# Patient Record
Sex: Female | Born: 1985 | Race: White | Hispanic: No | Marital: Single | Smoking: Former smoker
Health system: Southern US, Community
[De-identification: ages and names within clinical notes are randomized; demographics above are authoritative.]

## PROBLEM LIST (undated history)

## (undated) DIAGNOSIS — R51 Headache: Secondary | ICD-10-CM

## (undated) DIAGNOSIS — F329 Major depressive disorder, single episode, unspecified: Secondary | ICD-10-CM

## (undated) DIAGNOSIS — F32A Depression, unspecified: Secondary | ICD-10-CM

## (undated) DIAGNOSIS — N809 Endometriosis, unspecified: Secondary | ICD-10-CM

## (undated) DIAGNOSIS — Z8669 Personal history of other diseases of the nervous system and sense organs: Secondary | ICD-10-CM

## (undated) DIAGNOSIS — C801 Malignant (primary) neoplasm, unspecified: Secondary | ICD-10-CM

## (undated) DIAGNOSIS — R32 Unspecified urinary incontinence: Secondary | ICD-10-CM

## (undated) DIAGNOSIS — N200 Calculus of kidney: Secondary | ICD-10-CM

## (undated) DIAGNOSIS — B019 Varicella without complication: Secondary | ICD-10-CM

## (undated) DIAGNOSIS — N3010 Interstitial cystitis (chronic) without hematuria: Secondary | ICD-10-CM

## (undated) DIAGNOSIS — R519 Headache, unspecified: Secondary | ICD-10-CM

## (undated) HISTORY — DX: Depression, unspecified: F32.A

## (undated) HISTORY — DX: Varicella without complication: B01.9

## (undated) HISTORY — DX: Headache, unspecified: R51.9

## (undated) HISTORY — DX: Personal history of other diseases of the nervous system and sense organs: Z86.69

## (undated) HISTORY — DX: Interstitial cystitis (chronic) without hematuria: N30.10

## (undated) HISTORY — DX: Malignant (primary) neoplasm, unspecified: C80.1

## (undated) HISTORY — DX: Major depressive disorder, single episode, unspecified: F32.9

## (undated) HISTORY — DX: Unspecified urinary incontinence: R32

## (undated) HISTORY — DX: Endometriosis, unspecified: N80.9

## (undated) HISTORY — DX: Headache: R51

## (undated) HISTORY — DX: Calculus of kidney: N20.0

---
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
## 2009-09-21 ENCOUNTER — Emergency Department: Payer: Self-pay | Admitting: Emergency Medicine

## 2009-10-11 ENCOUNTER — Encounter: Payer: Self-pay | Admitting: Orthopedic Surgery

## 2011-06-05 HISTORY — PX: TONSILLECTOMY AND ADENOIDECTOMY: SHX28

## 2011-12-21 ENCOUNTER — Ambulatory Visit: Payer: Self-pay | Admitting: Unknown Physician Specialty

## 2012-07-15 ENCOUNTER — Emergency Department: Payer: Self-pay | Admitting: Emergency Medicine

## 2012-07-15 LAB — CBC WITH DIFFERENTIAL/PLATELET
Basophil #: 0 10*3/uL (ref 0.0–0.1)
Eosinophil #: 0.1 10*3/uL (ref 0.0–0.7)
Eosinophil %: 1.7 %
HGB: 12.8 g/dL (ref 12.0–16.0)
Lymphocyte #: 1 10*3/uL (ref 1.0–3.6)
Monocyte %: 6.9 %
Neutrophil #: 3.2 10*3/uL (ref 1.4–6.5)
RBC: 4.2 10*6/uL (ref 3.80–5.20)
WBC: 4.7 10*3/uL (ref 3.6–11.0)

## 2012-07-15 LAB — BASIC METABOLIC PANEL
Anion Gap: 9 (ref 7–16)
BUN: 11 mg/dL (ref 7–18)
Co2: 22 mmol/L (ref 21–32)
Creatinine: 0.9 mg/dL (ref 0.60–1.30)
EGFR (African American): >60

## 2013-05-26 ENCOUNTER — Ambulatory Visit: Payer: Self-pay | Admitting: Emergency Medicine

## 2013-09-07 LAB — HM PAP SMEAR: HM Pap smear: NORMAL

## 2015-02-22 ENCOUNTER — Encounter: Payer: Self-pay | Admitting: Nurse Practitioner

## 2015-02-22 ENCOUNTER — Ambulatory Visit (INDEPENDENT_AMBULATORY_CARE_PROVIDER_SITE_OTHER): Payer: Self-pay | Admitting: Nurse Practitioner

## 2015-02-22 ENCOUNTER — Encounter (INDEPENDENT_AMBULATORY_CARE_PROVIDER_SITE_OTHER): Payer: Self-pay

## 2015-02-22 VITALS — BP 108/72 | HR 84 | Temp 98.2°F | Resp 14 | Ht 65.0 in | Wt 235.6 lb

## 2015-02-22 DIAGNOSIS — R519 Headache, unspecified: Secondary | ICD-10-CM

## 2015-02-22 DIAGNOSIS — Z23 Encounter for immunization: Secondary | ICD-10-CM

## 2015-02-22 DIAGNOSIS — Z7189 Other specified counseling: Secondary | ICD-10-CM

## 2015-02-22 DIAGNOSIS — R51 Headache: Secondary | ICD-10-CM

## 2015-02-22 DIAGNOSIS — F411 Generalized anxiety disorder: Secondary | ICD-10-CM

## 2015-02-22 DIAGNOSIS — E669 Obesity, unspecified: Secondary | ICD-10-CM

## 2015-02-22 DIAGNOSIS — Z7689 Persons encountering health services in other specified circumstances: Secondary | ICD-10-CM

## 2015-02-22 MED ORDER — BUSPIRONE HCL 7.5 MG PO TABS: 7.5000 mg | ORAL_TABLET | Freq: Three times a day (TID) | ORAL | Status: DC

## 2015-02-22 NOTE — Patient Instructions (Signed)
Welcome to [facility]! Nice to meet you.   Follow up in 1 month. Need 7 days worth of exercise, food, and fluids that we can look over at next visit.

## 2015-02-22 NOTE — Progress Notes (Signed)
Pre visit review using our clinic review tool, if applicable. No additional management support is needed unless otherwise documented below in the visit note. 

## 2015-02-22 NOTE — Progress Notes (Signed)
Patient ID: Laura Wiggins, female    DOB: [birth year]  Age: 29 y.o. MRN: [id number]  CC: Establish Care   HPI Laura Wiggins presents for establishing care and CC of headache, weight loss, and anxiety.   1) New pt info:   Immunizations- unknown tdap  Pap- 2015, last one normal, past abnormal  Eye Exam- 03/28/15 scheduled  2) Acute Problems-  HA- right side of head parietal, achy, 4 x a week, been happening for at least 3 months. Family stressors, scheduled eye pain. Excedrin migraine at beginning of headache. Denies auras.   Weight- tried phentermine in past with success   Anxiety- Has tried multiple medications in the past. Pt willing to try anything to help with stress.    History Laura Wiggins has a past medical history of Cancer; Chicken pox; Depression; Frequent headaches; Kidney stones; migraines; Urinary incontinence; Endometriosis (2004); and Interstitial cystitis (2004).   She has past surgical history that includes Tonsillectomy and adenoidectomy (2013).   Her family history includes Diabetes in her mother; Hyperlipidemia in her mother; Hypertension in her mother.She reports that she has quit smoking. She has never used smokeless tobacco. She reports that she drinks alcohol. She reports that she does not use illicit drugs.  No outpatient prescriptions prior to visit.   No facility-administered medications prior to visit.    ROS Review of Systems  Constitutional: Negative for fever, chills, diaphoresis and fatigue.  Respiratory: Negative for chest tightness, shortness of breath and wheezing.   Cardiovascular: Negative for chest pain, palpitations and leg swelling.  Gastrointestinal: Negative for nausea, vomiting and diarrhea.  Skin: Negative for rash.  Neurological: Positive for headaches. Negative for dizziness, weakness and numbness.  Psychiatric/Behavioral: The patient is nervous/anxious.     Objective:  BP 108/72 mmHg  Pulse 84  Temp(Src) 98.2 F (36.8 C)  Resp  14  Ht 5\' 5"  (1.651 m)  Wt 235 lb 9.6 oz (106.867 kg)  BMI 39.21 kg/m2  SpO2 96%  Physical Exam  Constitutional: She is oriented to person, place, and time. She appears well-developed and well-nourished. No distress.  HENT:  Head: Normocephalic and atraumatic.  Right Ear: External ear normal.  Left Ear: External ear normal.  Cardiovascular: Normal rate, regular rhythm and normal heart sounds.   Pulmonary/Chest: Effort normal and breath sounds normal. No respiratory distress. She has no wheezes. She has no rales. She exhibits no tenderness.  Neurological: She is alert and oriented to person, place, and time. No cranial nerve deficit. She exhibits normal muscle tone. Coordination normal.  Skin: Skin is warm and dry. No rash noted. She is not diaphoretic.  Psychiatric: She has a normal mood and affect. Her behavior is normal. Judgment and thought content normal.   Assessment & Plan:   Laura Wiggins was seen today for establish care.  Diagnoses and all orders for this visit:  Encounter for immunization  Encounter to establish care  Nonintractable headache, unspecified chronicity pattern, unspecified headache type  Obesity  Generalized anxiety disorder  Other orders -     busPIRone (BUSPAR) 7.5 MG tablet; Take 1 tablet (7.5 mg total) by mouth 3 (three) times daily. -     Flu Vaccine QUAD 36+ mos IM   I am having [name] start on busPIRone. I am also having her maintain her multivitamin with minerals.  Meds ordered this encounter  Medications  . Multiple Vitamins-Minerals (MULTIVITAMIN WITH MINERALS) tablet    Sig: Take 1 tablet by mouth daily.  . busPIRone (BUSPAR) 7.5 MG  tablet    Sig: Take 1 tablet (7.5 mg total) by mouth 3 (three) times daily.    Dispense:  60 tablet    Refill:  0    Order Specific Question:  Supervising Provider    Answer:  Crecencio Mc [[id number]]     Follow-up: Return in about 4 weeks (around 03/22/2015) for Follow up.

## 2015-02-24 ENCOUNTER — Telehealth: Payer: Self-pay | Admitting: Nurse Practitioner

## 2015-02-24 NOTE — Telephone Encounter (Signed)
Left detailed message on VM needing to schedule appoint

## 2015-02-24 NOTE — Telephone Encounter (Signed)
Pt called about on the right side of her throat is sore and she wants to know if something can be called in? Pharmacy is [facility] on [address] [place]!

## 2015-02-25 DIAGNOSIS — R519 Headache, unspecified: Secondary | ICD-10-CM | POA: Insufficient documentation

## 2015-02-25 DIAGNOSIS — E669 Obesity, unspecified: Secondary | ICD-10-CM | POA: Insufficient documentation

## 2015-02-25 DIAGNOSIS — R51 Headache: Secondary | ICD-10-CM

## 2015-02-25 DIAGNOSIS — Z7689 Persons encountering health services in other specified circumstances: Secondary | ICD-10-CM | POA: Insufficient documentation

## 2015-02-25 DIAGNOSIS — F411 Generalized anxiety disorder: Secondary | ICD-10-CM | POA: Insufficient documentation

## 2015-02-25 NOTE — Assessment & Plan Note (Signed)
Buspirone 7.5 mg twice daily for anxiety. Pt to follow up in 1 month.

## 2015-02-25 NOTE — Assessment & Plan Note (Signed)
Discussed acute and chronic issues. Reviewed health maintenance measures, PFSHx, and immunizations. Obtain records from previous facility.   

## 2015-02-25 NOTE — Assessment & Plan Note (Signed)
Pt encouraged to work on diet and exercise. Dr. Derrel Nip diet copy given to pt to try. Requested 7 days of food, drink, and exercise to review at next visit.

## 2015-02-25 NOTE — Assessment & Plan Note (Signed)
Stable with excedrin migraine headache. Will follow

## 2015-03-25 ENCOUNTER — Ambulatory Visit (INDEPENDENT_AMBULATORY_CARE_PROVIDER_SITE_OTHER): Payer: BLUE CROSS/BLUE SHIELD | Admitting: Nurse Practitioner

## 2015-03-25 VITALS — BP 110/78 | HR 78 | Temp 98.2°F | Resp 14 | Ht 65.0 in | Wt 232.8 lb

## 2015-03-25 DIAGNOSIS — R51 Headache: Secondary | ICD-10-CM | POA: Diagnosis not present

## 2015-03-25 DIAGNOSIS — R519 Headache, unspecified: Secondary | ICD-10-CM

## 2015-03-25 DIAGNOSIS — F411 Generalized anxiety disorder: Secondary | ICD-10-CM | POA: Diagnosis not present

## 2015-03-25 DIAGNOSIS — E669 Obesity, unspecified: Secondary | ICD-10-CM | POA: Diagnosis not present

## 2015-03-25 MED ORDER — PHENTERMINE HCL 37.5 MG PO TABS: 37.5000 mg | ORAL_TABLET | Freq: Every day | ORAL | Status: DC

## 2015-03-25 NOTE — Patient Instructions (Signed)
Follow up in 1 month to see how progress is going.   Keep up the good work!

## 2015-03-25 NOTE — Progress Notes (Signed)
Pre visit review using our clinic review tool, if applicable. No additional management support is needed unless otherwise documented below in the visit note. 

## 2015-03-25 NOTE — Progress Notes (Signed)
Patient ID: Laura Wiggins, female    DOB: [birth year]  Age: 29 y.o. MRN: [id number]  CC: Follow-up   HPI Laura Wiggins presents for follow up of weight loss concerns and anxiety.   1) Down 3 lbs from last visit Patient brought 7 days worth of food, drinks, and exercise as asked at last visit. We discussed cutting back on portions and eating a lot of high carb foods.  2) Anxiety- Doing really well on Buspar she reports   Headaches decreased from daily to 2-3 x a week   1-2 miles 3 x a week walking only   History Laura Wiggins has a past medical history of Cancer; Chicken pox; Depression; Frequent headaches; Kidney stones; migraines; Urinary incontinence; Endometriosis (2004); and Interstitial cystitis (2004).   She has past surgical history that includes Tonsillectomy and adenoidectomy (2013).   Her family history includes Diabetes in her mother; Hyperlipidemia in her mother; Hypertension in her mother.She reports that she has quit smoking. She has never used smokeless tobacco. She reports that she drinks alcohol. She reports that she does not use illicit drugs.  Outpatient Prescriptions Prior to Visit  Medication Sig Dispense Refill  . busPIRone (BUSPAR) 7.5 MG tablet Take 1 tablet (7.5 mg total) by mouth 3 (three) times daily. 60 tablet 0  . Multiple Vitamins-Minerals (MULTIVITAMIN WITH MINERALS) tablet Take 1 tablet by mouth daily.     No facility-administered medications prior to visit.    ROS Review of Systems  Constitutional: Positive for activity change. Negative for fever, chills, diaphoresis, appetite change, fatigue and unexpected weight change.       Increased  Respiratory: Negative for chest tightness, shortness of breath and wheezing.   Cardiovascular: Negative for chest pain, palpitations and leg swelling.  Gastrointestinal: Negative for nausea, vomiting and diarrhea.  Skin: Negative for rash.  Neurological: Positive for headaches. Negative for dizziness, weakness and  numbness.  Psychiatric/Behavioral: Negative for suicidal ideas and sleep disturbance. The patient is not nervous/anxious.     Objective:  BP 110/78 mmHg  Pulse 78  Temp(Src) 98.2 F (36.8 C)  Resp 14  Ht 5\' 5"  (1.651 m)  Wt 232 lb 12.8 oz (105.597 kg)  BMI 38.74 kg/m2  SpO2 95%  Physical Exam  Constitutional: She is oriented to person, place, and time. She appears well-developed and well-nourished. No distress.  HENT:  Head: Normocephalic and atraumatic.  Right Ear: External ear normal.  Left Ear: External ear normal.  Cardiovascular: Normal rate, regular rhythm and normal heart sounds.  Exam reveals no gallop and no friction rub.   No murmur heard. Pulmonary/Chest: Effort normal and breath sounds normal. No respiratory distress. She has no wheezes. She has no rales. She exhibits no tenderness.  Abdominal:  Obese  Neurological: She is alert and oriented to person, place, and time. No cranial nerve deficit. She exhibits normal muscle tone. Coordination normal.  Skin: Skin is warm and dry. No rash noted. She is not diaphoretic.  Psychiatric: She has a normal mood and affect. Her behavior is normal. Judgment and thought content normal.   Assessment & Plan:   Laura Wiggins was seen today for follow-up.  Diagnoses and all orders for this visit:  Obesity  Nonintractable headache, unspecified chronicity pattern, unspecified headache type  Generalized anxiety disorder  Other orders -     phentermine (ADIPEX-P) 37.5 MG tablet; Take 1 tablet (37.5 mg total) by mouth daily before breakfast.  I am having Laura Wiggins start on phentermine. I am also having her  maintain her multivitamin with minerals and busPIRone.  Meds ordered this encounter  Medications  . phentermine (ADIPEX-P) 37.5 MG tablet    Sig: Take 1 tablet (37.5 mg total) by mouth daily before breakfast.    Dispense:  30 tablet    Refill:  0    Order Specific Question:  Supervising Provider    Answer:  Crecencio Mc  [[id number]]     Follow-up: Return in about 4 weeks (around 04/22/2015) for Weight loss follow up .

## 2015-03-28 ENCOUNTER — Ambulatory Visit: Payer: Self-pay | Admitting: Nurse Practitioner

## 2015-04-10 ENCOUNTER — Encounter: Payer: Self-pay | Admitting: Nurse Practitioner

## 2015-04-10 NOTE — Assessment & Plan Note (Signed)
Decreased in frequency. Follow-up in one month

## 2015-04-10 NOTE — Assessment & Plan Note (Signed)
We'll continue BuSpar 3 times daily. Follow-up in one month

## 2015-04-10 NOTE — Assessment & Plan Note (Addendum)
Wt Readings from Last 3 Encounters:  03/25/15 232 lb 12.8 oz (105.597 kg)  02/22/15 235 lb 9.6 oz (106.867 kg)   Patient started at 235 lbs and down 3 lbs by decreasing portions and writing down food, drink and exercise. Pt is motivated and is interested in adding an appetite suppressant. Patient has no cardiac history, we discussed risks, benefits, and patient agreed to proceed with phentermine for 3 months. Follow-up in one month

## 2015-04-22 ENCOUNTER — Ambulatory Visit: Payer: BLUE CROSS/BLUE SHIELD | Admitting: Nurse Practitioner

## 2015-05-02 ENCOUNTER — Encounter: Payer: Self-pay | Admitting: Nurse Practitioner

## 2015-05-02 ENCOUNTER — Ambulatory Visit (INDEPENDENT_AMBULATORY_CARE_PROVIDER_SITE_OTHER): Payer: BLUE CROSS/BLUE SHIELD | Admitting: Nurse Practitioner

## 2015-05-02 VITALS — BP 102/76 | HR 71 | Temp 97.8°F | Resp 14 | Ht 65.0 in | Wt 225.8 lb

## 2015-05-02 DIAGNOSIS — F411 Generalized anxiety disorder: Secondary | ICD-10-CM | POA: Diagnosis not present

## 2015-05-02 DIAGNOSIS — E669 Obesity, unspecified: Secondary | ICD-10-CM

## 2015-05-02 DIAGNOSIS — R51 Headache: Secondary | ICD-10-CM

## 2015-05-02 DIAGNOSIS — R519 Headache, unspecified: Secondary | ICD-10-CM

## 2015-05-02 MED ORDER — SUMATRIPTAN SUCCINATE 25 MG PO TABS: 25.0000 mg | ORAL_TABLET | Freq: Once | ORAL | Status: DC

## 2015-05-02 MED ORDER — PHENTERMINE HCL 37.5 MG PO TABS: 37.5000 mg | ORAL_TABLET | Freq: Every day | ORAL | Status: DC

## 2015-05-02 MED ORDER — BUSPIRONE HCL 7.5 MG PO TABS: 7.5000 mg | ORAL_TABLET | Freq: Three times a day (TID) | ORAL | Status: DC

## 2015-05-02 NOTE — Assessment & Plan Note (Signed)
Stable. Requesting refill of Buspar 7.5 mg twice daily. Will FU in 2 months.

## 2015-05-02 NOTE — Assessment & Plan Note (Signed)
Mirgaines still happening. 1 this past week resulted in phonophobia and vomiting. Will have a trial of Imitrex 25 mg. Pt was given instructions on use. Will FU in 2 months

## 2015-05-02 NOTE — Progress Notes (Signed)
Patient ID: Laura Wiggins, female    DOB: [birth year]  Age: 29 y.o. MRN: [id number]  CC: Follow-up   HPI Laura Wiggins presents for follow up of weight loss, headaches,and anxiety.  1) Anxiety- Ran out of buspirone  2) Weight loss- Down 7 lbs since last visit.   Side effects- none reported   Took whole pill at first then went back to 1/2 tablet  Diet changes- working on low carb diet Exercise- Still going to the track and walking   3) HA- Excedrin Migraine somewhat helpful   Migraine- made her sick last week (vomited)   Always on right side of head  History Laura Wiggins has a past medical history of Cancer ([place]); Chicken pox; Depression; Frequent headaches; Kidney stones; migraines; Urinary incontinence; Endometriosis (2004); and Interstitial cystitis (2004).   She has past surgical history that includes Tonsillectomy and adenoidectomy (2013).   Her family history includes Diabetes in her mother; Hyperlipidemia in her mother; Hypertension in her mother.She reports that she has quit smoking. She has never used smokeless tobacco. She reports that she drinks alcohol. She reports that she does not use illicit drugs.  Outpatient Prescriptions Prior to Visit  Medication Sig Dispense Refill  . Multiple Vitamins-Minerals (MULTIVITAMIN WITH MINERALS) tablet Take 1 tablet by mouth daily.    . busPIRone (BUSPAR) 7.5 MG tablet Take 1 tablet (7.5 mg total) by mouth 3 (three) times daily. 60 tablet 0  . phentermine (ADIPEX-P) 37.5 MG tablet Take 1 tablet (37.5 mg total) by mouth daily before breakfast. 30 tablet 0   No facility-administered medications prior to visit.    ROS Review of Systems  Constitutional: Negative for fever, chills, diaphoresis and fatigue.  HENT: Negative for tinnitus and trouble swallowing.   Eyes: Negative for visual disturbance.  Respiratory: Negative for cough, chest tightness and wheezing.   Cardiovascular: Negative for chest pain, palpitations and leg swelling.   Gastrointestinal: Negative for nausea, vomiting and diarrhea.  Neurological: Positive for headaches.       Migraines  Psychiatric/Behavioral: Negative for suicidal ideas and sleep disturbance. The patient is nervous/anxious.     Objective:  BP 102/76 mmHg  Pulse 71  Temp(Src) 97.8 F (36.6 C)  Resp 14  Ht 5\' 5"  (1.651 m)  Wt 225 lb 12.8 oz (102.422 kg)  BMI 37.58 kg/m2  SpO2 97%  Physical Exam  Constitutional: She is oriented to person, place, and time. She appears well-developed and well-nourished. No distress.  HENT:  Head: Normocephalic and atraumatic.  Right Ear: External ear normal.  Left Ear: External ear normal.  Cardiovascular: Normal rate, regular rhythm and normal heart sounds.  Exam reveals no gallop and no friction rub.   No murmur heard. Pulmonary/Chest: Effort normal and breath sounds normal. No respiratory distress. She has no wheezes. She has no rales. She exhibits no tenderness.  Neurological: She is alert and oriented to person, place, and time. No cranial nerve deficit. She exhibits normal muscle tone. Coordination normal.  Skin: Skin is warm and dry. No rash noted. She is not diaphoretic.  Psychiatric: She has a normal mood and affect. Her behavior is normal. Judgment and thought content normal.   Assessment & Plan:   There are no diagnoses linked to this encounter. I am having [name] start on SUMAtriptan. I am also having her maintain her multivitamin with minerals, busPIRone, and phentermine.  Meds ordered this encounter  Medications  . busPIRone (BUSPAR) 7.5 MG tablet    Sig: Take 1 tablet (7.5  mg total) by mouth 3 (three) times daily.    Dispense:  60 tablet    Refill:  1    Order Specific Question:  Supervising Provider    Answer:  Deborra Medina L [[id number]]  . phentermine (ADIPEX-P) 37.5 MG tablet    Sig: Take 1 tablet (37.5 mg total) by mouth daily before breakfast.    Dispense:  30 tablet    Refill:  1    Order Specific Question:   Supervising Provider    Answer:  Deborra Medina L [[id number]]  . SUMAtriptan (IMITREX) 25 MG tablet    Sig: Take 1 tablet (25 mg total) by mouth once. May repeat in 2 hours if headache persists or recurs.    Dispense:  10 tablet    Refill:  0    Order Specific Question:  Supervising Provider    Answer:  Crecencio Mc [[id number]]     Follow-up: Return in about 2 months (around 07/02/2015) for Medication follow up.

## 2015-05-02 NOTE — Progress Notes (Signed)
Pre visit review using our clinic review tool, if applicable. No additional management support is needed unless otherwise documented below in the visit note. 

## 2015-05-02 NOTE — Assessment & Plan Note (Signed)
Wt Readings from Last 3 Encounters:  05/02/15 225 lb 12.8 oz (102.422 kg)  03/25/15 232 lb 12.8 oz (105.597 kg)  02/22/15 235 lb 9.6 oz (106.867 kg)   Pt is doing well with diet and exercise changes. She is taking 1/2 tablet intermittently of the phentermine and does well at this dosage. She will follow up in 2 months. Encouraged her to keep up the good work

## 2015-05-02 NOTE — Patient Instructions (Signed)
Great to see you! We will follow up in 2 months.   Let me know if the Imitrex is helpful or not.

## 2015-07-04 ENCOUNTER — Ambulatory Visit: Payer: BLUE CROSS/BLUE SHIELD | Admitting: Nurse Practitioner

## 2015-07-11 ENCOUNTER — Ambulatory Visit (INDEPENDENT_AMBULATORY_CARE_PROVIDER_SITE_OTHER): Payer: BLUE CROSS/BLUE SHIELD | Admitting: Nurse Practitioner

## 2015-07-11 ENCOUNTER — Encounter: Payer: Self-pay | Admitting: Nurse Practitioner

## 2015-07-11 VITALS — BP 112/74 | HR 95 | Temp 98.2°F | Resp 14 | Ht 65.0 in | Wt 215.2 lb

## 2015-07-11 DIAGNOSIS — F411 Generalized anxiety disorder: Secondary | ICD-10-CM | POA: Diagnosis not present

## 2015-07-11 DIAGNOSIS — E669 Obesity, unspecified: Secondary | ICD-10-CM

## 2015-07-11 DIAGNOSIS — J069 Acute upper respiratory infection, unspecified: Secondary | ICD-10-CM

## 2015-07-11 DIAGNOSIS — R519 Headache, unspecified: Secondary | ICD-10-CM

## 2015-07-11 DIAGNOSIS — R51 Headache: Secondary | ICD-10-CM

## 2015-07-11 MED ORDER — AMOXICILLIN-POT CLAVULANATE 875-125 MG PO TABS: 1.0000 | ORAL_TABLET | Freq: Two times a day (BID) | ORAL | Status: DC

## 2015-07-11 NOTE — Patient Instructions (Signed)
You look great! Keep up the great work.   Take the augmentin if you get a fever 100.5 or facial pain on one side vs. The other + green snot.

## 2015-07-11 NOTE — Progress Notes (Signed)
Patient ID: Laura Wiggins, female    DOB: 11-12-1985  Age: 30 y.o. MRN: [id number]  CC: Follow-up   HPI ZAHIRA FOLKS presents for follow up of medications, refills and chief complaint of URI symptoms.  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   She has past surgical history that includes Tonsillectomy and adenoidectomy (2013).   Her family history includes Diabetes in her mother; Hyperlipidemia in her mother; Hypertension in her mother.She reports that she has quit smoking. She has never used smokeless tobacco. She reports that she drinks alcohol. She reports that she does not use illicit drugs.  Outpatient Prescriptions Prior to Visit  Medication Sig Dispense Refill  . busPIRone (BUSPAR) 7.5 MG tablet Take 1 tablet (7.5 mg total) by mouth 3 (three) times daily. 60 tablet 1  . Multiple Vitamins-Minerals (MULTIVITAMIN WITH MINERALS) tablet Take 1 tablet by mouth daily.    . phentermine (ADIPEX-P) 37.5 MG tablet Take 1 tablet (37.5 mg total) by mouth daily before breakfast. 30 tablet 1  . SUMAtriptan (IMITREX) 25 MG tablet Take 1 tablet (25 mg total) by mouth once. May repeat in 2 hours if headache persists or recurs. 10 tablet 0   No facility-administered medications prior to visit.   ROS Review of Systems  Constitutional: Positive for fatigue. Negative for fever, chills and diaphoresis.  HENT: Positive for congestion and sore throat.   Respiratory: Negative for chest tightness, shortness of breath and wheezing.    Cardiovascular: Negative for chest pain, palpitations and leg swelling.  Gastrointestinal: Negative for nausea, vomiting and diarrhea.  Skin: Negative for rash.  Neurological: Positive for headaches. Negative for dizziness, weakness and numbness.  Psychiatric/Behavioral: The patient is not nervous/anxious.    Objective:  BP 112/74 mmHg  Pulse 95  Temp(Src) 98.2 F (36.8 C)  Resp 14  Ht 5\' 5"  (1.651 m)  Wt 215 lb 4 oz (97.637 kg)  BMI 35.82 kg/m2  SpO2 99%  Physical Exam  Constitutional: She is oriented to person, place, and time. She appears well-developed and well-nourished. No distress.  HENT:  Head: Normocephalic and atraumatic.  Right Ear: External ear normal.  Left Ear: External ear normal.  Mouth/Throat: Oropharynx is clear and moist. No oropharyngeal exudate.  TMs are bilaterally   Eyes: EOM are normal. Pupils are equal, round, and reactive to light. Right eye exhibits no discharge. Left eye exhibits no discharge. No scleral icterus.  Neck: Normal range of motion. Neck supple.  Cardiovascular: Normal rate, regular rhythm and normal heart sounds.  Exam reveals no gallop and no friction rub.   No murmur heard. Pulmonary/Chest: Effort normal and breath sounds normal. No respiratory distress. She has no wheezes. She has no rales. She exhibits no tenderness.  Lymphadenopathy:    She has no cervical adenopathy.  Neurological: She is alert and oriented to person, place, and time. No cranial nerve deficit. She exhibits normal muscle tone. Coordination normal.  Skin: Skin is warm and dry. No rash noted. She is not diaphoretic.  Psychiatric: She has a normal mood and affect. Her behavior  is normal. Judgment and thought content normal.   Assessment & Plan:   Karesa was seen today for follow-up.  Diagnoses and all orders for this visit:  Acute URI  Generalized anxiety disorder  Nonintractable headache, unspecified chronicity pattern, unspecified headache  type  Obesity  Other orders -     amoxicillin-clavulanate (AUGMENTIN) 875-125 MG tablet; Take 1 tablet by mouth 2 (two) times daily.   I am having Ms. Weisner start on amoxicillin-clavulanate. I am also having her maintain her multivitamin with minerals, busPIRone, phentermine, and SUMAtriptan.  Meds ordered this encounter  Medications  . amoxicillin-clavulanate (AUGMENTIN) 875-125 MG tablet    Sig: Take 1 tablet by mouth 2 (two) times daily.    Dispense:  14 tablet    Refill:  0    Order Specific Question:  Supervising Provider    Answer:  Crecencio Mc [[id number]]     Follow-up: Return if symptoms worsen or fail to improve.

## 2015-07-16 NOTE — Assessment & Plan Note (Signed)
New Onset Due to length of symptoms with worsening will treat empirically  Augmentin was sent to the pharmacy Encouraged Probiotics Continue OTC measures  FU prn worsening/failure to improve.

## 2015-07-16 NOTE — Assessment & Plan Note (Signed)
Stable We'll request Imitrex refill some pharmacy if needed in future Headaches have been few and far between URI has caused a headache recently

## 2015-07-16 NOTE — Assessment & Plan Note (Signed)
Stable off of phentermine Patient continues to lose weight 10 pounds down from November Congratulated her and encouraged her to keep up the good work

## 2015-07-16 NOTE — Assessment & Plan Note (Signed)
Stable Denies need for refills at this time but will contact pharmacy if needed in future

## 2015-10-18 ENCOUNTER — Telehealth: Payer: Self-pay | Admitting: Nurse Practitioner

## 2015-10-18 NOTE — Telephone Encounter (Signed)
Dr. Gilford Rile will except as a new pt.. Thanks ----former [facility] pt

## 2015-12-05 ENCOUNTER — Ambulatory Visit (INDEPENDENT_AMBULATORY_CARE_PROVIDER_SITE_OTHER): Payer: BLUE CROSS/BLUE SHIELD | Admitting: Family Medicine

## 2015-12-05 ENCOUNTER — Encounter: Payer: Self-pay | Admitting: Family Medicine

## 2015-12-05 ENCOUNTER — Telehealth: Payer: Self-pay | Admitting: *Deleted

## 2015-12-05 ENCOUNTER — Other Ambulatory Visit: Payer: Self-pay | Admitting: Family Medicine

## 2015-12-05 ENCOUNTER — Ambulatory Visit: Payer: BLUE CROSS/BLUE SHIELD | Admitting: Internal Medicine

## 2015-12-05 VITALS — BP 110/66 | HR 90 | Temp 98.1°F | Wt 221.0 lb

## 2015-12-05 DIAGNOSIS — F329 Major depressive disorder, single episode, unspecified: Secondary | ICD-10-CM | POA: Diagnosis not present

## 2015-12-05 DIAGNOSIS — F32A Depression, unspecified: Secondary | ICD-10-CM

## 2015-12-05 DIAGNOSIS — F339 Major depressive disorder, recurrent, unspecified: Secondary | ICD-10-CM | POA: Insufficient documentation

## 2015-12-05 MED ORDER — BUPROPION HCL ER (XL) 150 MG PO TB24: 150.0000 mg | ORAL_TABLET | Freq: Every day | ORAL | Status: DC

## 2015-12-05 MED ORDER — CLOBETASOL PROPIONATE 0.05 % EX OINT: 1.0000 "application " | TOPICAL_OINTMENT | Freq: Two times a day (BID) | CUTANEOUS | Status: DC | PRN

## 2015-12-05 MED ORDER — SUMATRIPTAN SUCCINATE 25 MG PO TABS: 25.0000 mg | ORAL_TABLET | Freq: Once | ORAL | Status: DC

## 2015-12-05 NOTE — Assessment & Plan Note (Signed)
New problem. Treating with Wellbutrin.

## 2015-12-05 NOTE — Telephone Encounter (Signed)
Pt stated that she was to receive a topical steroid , however the pharmacy did not receive the script .

## 2015-12-05 NOTE — Progress Notes (Signed)
Pre visit review using our clinic review tool, if applicable. No additional management support is needed unless otherwise documented below in the visit note. 

## 2015-12-05 NOTE — Telephone Encounter (Signed)
Please advise, patient was seen today, I don't see any notes regarding a topical cream, ??thanks

## 2015-12-05 NOTE — Progress Notes (Signed)
   Subjective:  Patient ID: Laura Wiggins, female    DOB: 05-03-1986  Age: 30 y.o. MRN: [id number]  CC: Depression  HPI:  30 year old female presents with complaints of depression. She would like to discuss treatment options today.  Depression  Patient reports that she's had depression in the remote past.  Patient states that she was treated previously and did well.  She has previously been able to handle her depression and her life stressors.  As of late she's been unable to do so.  Patient states that she has been experiencing increase in life stressors (family dynamics, troubles at work and switching careers, being a single parent).  Patient states that she feels down and depressed and would like to discuss treatment options today.  No known relieving factors.  No other complaints at this time.  Social Hx   Social History   Social History  . Marital Status: Single    Spouse Name: N/A  . Number of Children: N/A  . Years of Education: N/A   Social History Main Topics  . Smoking status: Former Research scientist (life sciences)  . Smokeless tobacco: Never Used  . Alcohol Use: 0.0 oz/week    0 Standard drinks or equivalent per week  . Drug Use: No  . Sexual Activity: Not Asked   Other Topics Concern  . None   Social History Narrative   Review of Systems  Constitutional: Negative.   Psychiatric/Behavioral:       Depression, anxiety.   Objective:  BP 110/66 mmHg  Pulse 90  Temp(Src) 98.1 F (36.7 C) (Oral)  Wt 221 lb (100.245 kg)  SpO2 98%  LMP 12/03/2015  BP/Weight 12/05/2015 07/11/2015 [id number]  Systolic BP A999333 [id number] A999333  Diastolic BP 66 74 76  Wt. (Lbs) 221 215.25 225.8  BMI 36.78 35.82 37.58   Physical Exam  Constitutional: She is oriented to person, place, and time. She appears well-developed. No distress.  Cardiovascular: Normal rate and regular rhythm.   Pulmonary/Chest: Effort normal. She has no wheezes. She has no rales.  Neurological: She is alert and oriented to  person, place, and time.  Psychiatric:  Flat affect.  Vitals reviewed.  Lab Results  Component Value Date   WBC 4.7 07/15/2012   HGB 12.8 07/15/2012   HCT 37.2 07/15/2012   PLT 225 07/15/2012   GLUCOSE 104* 07/15/2012   NA 138 07/15/2012   K 3.7 07/15/2012   CL 107 07/15/2012   CREATININE 0.90 07/15/2012   BUN 11 07/15/2012   CO2 22 07/15/2012    Assessment & Plan:   Problem List Items Addressed This Visit    Depression - Primary    New problem. Treating with Wellbutrin.      Relevant Medications   buPROPion (WELLBUTRIN XL) 150 MG 24 hr tablet      Meds ordered this encounter  Medications  . SUMAtriptan (IMITREX) 25 MG tablet    Sig: Take 1 tablet (25 mg total) by mouth once. May repeat in 2 hours if headache persists or recurs.    Dispense:  10 tablet    Refill:  2  . buPROPion (WELLBUTRIN XL) 150 MG 24 hr tablet    Sig: Take 1 tablet (150 mg total) by mouth daily.    Dispense:  90 tablet    Refill:  0   Follow-up: 6 weeks  Thersa Salt DO [facility]

## 2015-12-05 NOTE — Telephone Encounter (Signed)
Sorry; Rx sent.

## 2015-12-05 NOTE — Patient Instructions (Signed)
Take the Wellbutrin as prescribed.  Call or send a mychart message if you want to switch.  Follow up in 6 weeks.  Take care  Dr. Lacinda Axon

## 2015-12-05 NOTE — Telephone Encounter (Signed)
Notified patient that Rx was sent. thanks

## 2016-01-16 ENCOUNTER — Ambulatory Visit: Payer: BLUE CROSS/BLUE SHIELD | Admitting: Family Medicine

## 2016-02-03 ENCOUNTER — Ambulatory Visit (INDEPENDENT_AMBULATORY_CARE_PROVIDER_SITE_OTHER): Payer: BLUE CROSS/BLUE SHIELD | Admitting: Family Medicine

## 2016-02-03 ENCOUNTER — Encounter: Payer: Self-pay | Admitting: Family Medicine

## 2016-02-03 VITALS — BP 112/70 | HR 84 | Temp 98.1°F | Resp 16 | Wt 199.0 lb

## 2016-02-03 DIAGNOSIS — F329 Major depressive disorder, single episode, unspecified: Secondary | ICD-10-CM

## 2016-02-03 DIAGNOSIS — Z23 Encounter for immunization: Secondary | ICD-10-CM

## 2016-02-03 DIAGNOSIS — G4700 Insomnia, unspecified: Secondary | ICD-10-CM | POA: Diagnosis not present

## 2016-02-03 DIAGNOSIS — F32A Depression, unspecified: Secondary | ICD-10-CM

## 2016-02-03 MED ORDER — ESZOPICLONE 1 MG PO TABS: 1.0000 mg | ORAL_TABLET | Freq: Every evening | ORAL | 0 refills | Status: DC | PRN

## 2016-02-03 MED ORDER — BUPROPION HCL ER (XL) 150 MG PO TB24: 150.0000 mg | ORAL_TABLET | Freq: Every day | ORAL | 3 refills | Status: DC

## 2016-02-03 NOTE — Assessment & Plan Note (Signed)
New problem. Treating with Sonata.

## 2016-02-03 NOTE — Assessment & Plan Note (Signed)
Established problem, improving. Doing well on Wellbutrin. Will continue.

## 2016-02-03 NOTE — Progress Notes (Signed)
Subjective:  Patient ID: Laura Wiggins, female    DOB: [birth year]  Age: 30 y.o. MRN: [id number]  CC: Follow up, Insomnia  HPI:  30 year old female presents for follow-up regarding depression. She also complains of insomnia.  Depression  Doing well/much improved on Wellbutrin.  Wants to continue and is requesting refill.  Insomnia  Patient has recently been experiencing insomnia.  She states that she has difficulty staying asleep.  She wakes up several times at night and then has difficulty falling back to sleep.  She's tried over-the-counter medications without symptom improvement.  No known exacerbating factors.  No other complaints at this time.  She would like to discuss treatment options.  Social Hx   Social History   Social History  . Marital status: Single    Spouse name: N/A  . Number of children: N/A  . Years of education: N/A   Social History Main Topics  . Smoking status: Former Research scientist (life sciences)  . Smokeless tobacco: Never Used  . Alcohol use 0.0 oz/week     Comment: occasioanlly  . Drug use: No  . Sexual activity: Not Asked   Other Topics Concern  . None   Social History Narrative  . None    Review of Systems  Constitutional: Negative.   Psychiatric/Behavioral: Positive for sleep disturbance.   Objective:  BP 112/70 (BP Location: Right Arm, Patient Position: Sitting, Cuff Size: Large)   Pulse 84   Temp 98.1 F (36.7 C) (Oral)   Resp 16   Wt 199 lb (90.3 kg)   LMP 01/23/2016 (Within Days)   BMI 33.12 kg/m   BP/Weight 02/03/2016 0000000 [id number]  Systolic BP [id number] A999333 [id number]  Diastolic BP 70 66 74  Wt. (Lbs) 199 221 215.25  BMI 33.12 36.78 35.82    Physical Exam  Constitutional: She is oriented to person, place, and time. She appears well-developed. No distress.  Cardiovascular: Normal rate and regular rhythm.   Pulmonary/Chest: Effort normal and breath sounds normal.  Neurological: She is alert and oriented to person, place, and time.    Psychiatric: She has a normal mood and affect.  Vitals reviewed.  Lab Results  Component Value Date   WBC 4.7 07/15/2012   HGB 12.8 07/15/2012   HCT 37.2 07/15/2012   PLT 225 07/15/2012   GLUCOSE 104 (H) 07/15/2012   NA 138 07/15/2012   K 3.7 07/15/2012   CL 107 07/15/2012   CREATININE 0.90 07/15/2012   BUN 11 07/15/2012   CO2 22 07/15/2012    Assessment & Plan:   Problem List Items Addressed This Visit    Depression    Established problem, improving. Doing well on Wellbutrin. Will continue.      Relevant Medications   buPROPion (WELLBUTRIN XL) 150 MG 24 hr tablet   Insomnia    New problem. Treating with Sonata.       Other Visit Diagnoses    Encounter for immunization       Relevant Orders   Flu Vaccine QUAD 36+ mos IM (Completed)      Meds ordered this encounter  Medications  . buPROPion (WELLBUTRIN XL) 150 MG 24 hr tablet    Sig: Take 1 tablet (150 mg total) by mouth daily.    Dispense:  90 tablet    Refill:  3  . eszopiclone (LUNESTA) 1 MG TABS tablet    Sig: Take 1 tablet (1 mg total) by mouth at bedtime as needed for sleep. Take immediately before bedtime    Dispense:  90 tablet    Refill:  0    Follow-up: Annually  [place]

## 2016-02-03 NOTE — Patient Instructions (Signed)
Follow up annually.   Call or send a message with concerns.  Take care  Dr. Lacinda Axon

## 2016-08-06 ENCOUNTER — Encounter: Payer: Self-pay | Admitting: Family Medicine

## 2016-08-06 ENCOUNTER — Ambulatory Visit (INDEPENDENT_AMBULATORY_CARE_PROVIDER_SITE_OTHER): Payer: 59 | Admitting: Family Medicine

## 2016-08-06 VITALS — BP 121/79 | HR 93 | Temp 98.0°F | Wt 212.4 lb

## 2016-08-06 DIAGNOSIS — I7381 Erythromelalgia: Secondary | ICD-10-CM

## 2016-08-06 MED ORDER — AMLODIPINE BESYLATE 2.5 MG PO TABS: 2.5000 mg | ORAL_TABLET | Freq: Every day | ORAL | 0 refills | Status: DC

## 2016-08-06 MED ORDER — BUPROPION HCL ER (XL) 300 MG PO TB24: 300.0000 mg | ORAL_TABLET | Freq: Every day | ORAL | 3 refills | Status: DC

## 2016-08-06 NOTE — Progress Notes (Signed)
Pre visit review using our clinic review tool, if applicable. No additional management support is needed unless otherwise documented below in the visit note. 

## 2016-08-06 NOTE — Patient Instructions (Signed)
Try the medicine and let me know.  Take care  Dr. Lacinda Axon

## 2016-08-07 DIAGNOSIS — I7381 Erythromelalgia: Secondary | ICD-10-CM | POA: Insufficient documentation

## 2016-08-07 LAB — CBC WITH DIFFERENTIAL/PLATELET
BASOS: 0 %
Basophils Absolute: 0 10*3/uL (ref 0.0–0.2)
EOS (ABSOLUTE): 0.1 10*3/uL (ref 0.0–0.4)
EOS: 2 %
HEMATOCRIT: 36.4 % (ref 34.0–46.6)
Hemoglobin: 12.7 g/dL (ref 11.1–15.9)
IMMATURE GRANS (ABS): 0 10*3/uL (ref 0.0–0.1)
IMMATURE GRANULOCYTES: 0 %
LYMPHS: 31 %
Lymphocytes Absolute: 1.6 10*3/uL (ref 0.7–3.1)
MCH: 30.5 pg (ref 26.6–33.0)
MCHC: 34.9 g/dL (ref 31.5–35.7)
MCV: 88 fL (ref 79–97)
Monocytes Absolute: 0.6 10*3/uL (ref 0.1–0.9)
Monocytes: 12 %
NEUTROS PCT: 55 %
Neutrophils Absolute: 2.9 10*3/uL (ref 1.4–7.0)
Platelets: 287 10*3/uL (ref 150–379)
RBC: 4.16 x10E6/uL (ref 3.77–5.28)
RDW: 13.4 % (ref 12.3–15.4)
WBC: 5.2 10*3/uL (ref 3.4–10.8)

## 2016-08-07 NOTE — Progress Notes (Signed)
Subjective:  Patient ID: Laura Wiggins, female    DOB: 31-Aug-1985  Age: 31 y.o. MRN: [id number]  CC: Hand redness/burning  HPI:  31 year old female presents with the above complaints.  Patient states that for the past few months she has had intermittent episodes where her hands and fingers get incredibly red/flushed. No preceding pallor or discoloration suggestive of Raynaud's. She reports that when this occurs her hands feel like they are burning/are on fire. Last for hours and then subsides. No known inciting factor. No new changes or exposures. She has never had this previously. Moderate in severity. No other associated symptoms. No other complaints or concerns at this time.  Social Hx   Social History   Social History  . Marital status: Single    Spouse name: N/A  . Number of children: N/A  . Years of education: N/A   Social History Main Topics  . Smoking status: Former Research scientist (life sciences)  . Smokeless tobacco: Never Used  . Alcohol use 0.0 oz/week     Comment: occasioanlly  . Drug use: No  . Sexual activity: Not Asked   Other Topics Concern  . None   Social History Narrative  . None    Review of Systems  Constitutional: Negative.   Skin:       Flushing/redness of hands/fingers.  All other systems reviewed and are negative.  Objective:  BP 121/79   Pulse 93   Temp 98 F (36.7 C) (Oral)   Wt 212 lb 6.4 oz (96.3 kg)   SpO2 100%   BMI 35.35 kg/m   BP/Weight 08/06/2016 [id number] 0000000  Systolic BP 123XX123 XX123456 A999333  Diastolic BP 79 70 66  Wt. (Lbs) 212.4 199 221  BMI 35.35 33.12 36.78    Physical Exam  Constitutional: She is oriented to person, place, and time. She appears well-developed. No distress.  Pulmonary/Chest: Effort normal and breath sounds normal.  Musculoskeletal:  Hands - palms of the hands with mild erythema. Mild dryness. Pulses intact. No pain or tenderness.  Neurological: She is alert and oriented to person, place, and time.  Psychiatric: She has a  normal mood and affect.  Vitals reviewed.   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  There are several medications that can be used for this condition, I am electing to try a trial of Norvasc.      Relevant Medications   amLODipine (NORVASC) 2.5 MG tablet   Other Relevant Orders   CBC with Differential (Completed)      Meds ordered this encounter  Medications  . amLODipine (NORVASC) 2.5 MG tablet    Sig: Take 1 tablet (2.5 mg total) by mouth daily.    Dispense:  30 tablet    Refill:  0  . buPROPion (WELLBUTRIN XL) 300 MG 24 hr tablet    Sig: Take 1 tablet (300 mg total) by mouth daily.    Dispense:  90 tablet    Refill:  3    Follow-up:  PRN  [place]

## 2016-08-07 NOTE — Assessment & Plan Note (Signed)
New problem. 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  There are several medications that can be used for this condition, I am electing to try a trial of Norvasc.

## 2016-09-02 ENCOUNTER — Other Ambulatory Visit: Payer: Self-pay | Admitting: Family Medicine

## 2016-09-06 ENCOUNTER — Telehealth: Payer: Self-pay | Admitting: Family Medicine

## 2016-09-06 NOTE — Telephone Encounter (Signed)
Patient advised of below and verbalized an understanding , she will call back to schedule ov if swelling persist

## 2016-09-06 NOTE — Telephone Encounter (Signed)
Likely from venous stasis, inactivity. If persists, would have her evaluated.

## 2016-09-06 NOTE — Telephone Encounter (Signed)
Pt called and stated that she has stopped taking the blood pressure medication as advised but her legs are still swelling. Does pt need to come in to get evaluated? Please advise, thank you!  Call pt @ [phone]

## 2016-09-06 NOTE — Telephone Encounter (Signed)
Spoke with patient states when mom came in for she spoke with Dr Lacinda Axon in regards to her daughters legs swelling he advised her to stop amlodipine.   She hasn't been monitoring blood pressure.      Legs are still swelling, left leg is more swollen than right leg, no discoloration.  No swelling in the legs in the am by the pm in the swelling is worse.  Her job is sedentary.   She does take breaks during the day .    Legs just feel tight.   Please advise.

## 2016-09-11 ENCOUNTER — Ambulatory Visit (INDEPENDENT_AMBULATORY_CARE_PROVIDER_SITE_OTHER): Payer: 59 | Admitting: Family Medicine

## 2016-09-11 DIAGNOSIS — I7381 Erythromelalgia: Secondary | ICD-10-CM

## 2016-09-11 DIAGNOSIS — R600 Localized edema: Secondary | ICD-10-CM

## 2016-09-11 HISTORY — DX: Localized edema: R60.0

## 2016-09-11 NOTE — Patient Instructions (Signed)
We will call with your referral.  Take care  Dr. Lacinda Axon

## 2016-09-11 NOTE — Assessment & Plan Note (Signed)
Worsening. Given numerous treatment options and rarity of this condition, I am referring her to a physician found on the Erythromelalgia association website.

## 2016-09-11 NOTE — Assessment & Plan Note (Signed)
New problem. Secondary to recent medication, obesity, sedentary lifestyle. Advised compression and elevation.

## 2016-09-11 NOTE — Progress Notes (Signed)
   Subjective:  Patient ID: Laura Wiggins, female    DOB: [birth year]  Age: 31 y.o. MRN: [id number]  CC: Leg swelling  HPI:  31 year old female presents with the above complaint. Issues are below.  Leg swelling  Patient reports a 3-4 week history of leg swelling.  Started after a trial of Norvasc for her erythromelalgia.  She has stopped the medication.  She reports that she continues to have bilateral lower extremity swelling.  Worse in the day.  Is improved in the morning.  She works a sedentary job and is obese.  No associated shortness of breath. No other associated symptoms.  Erythromelalgia  Patient reports intermittent red, hot, burning hands.  Her history and clinical picture appeared to be consistent with the above diagnosis.  She did not have any improvement with Norvasc.  She is very bothered by this.  She would like to discuss treatment options and potentially a referral to a specialist.   Social Hx   Social History   Social History  . Marital status: Single    Spouse name: N/A  . Number of children: N/A  . Years of education: N/A   Social History Main Topics  . Smoking status: Former Research scientist (life sciences)  . Smokeless tobacco: Never Used  . Alcohol use 0.0 oz/week     Comment: occasioanlly  . Drug use: No  . Sexual activity: Not on file   Other Topics Concern  . Not on file   Social History Narrative  . No narrative on file    Review of Systems  Constitutional: Negative.   Cardiovascular: Positive for leg swelling.   Objective:  BP 132/80   Pulse 82   Temp 98.2 F (36.8 C)   Wt 210 lb 6.4 oz (95.4 kg)   SpO2 99%   BMI 35.01 kg/m   BP/Weight 09/11/2016 7/0/1779 08/10/298  Systolic BP 923 300 762  Diastolic BP 80 79 70  Wt. (Lbs) 210.4 212.4 199  BMI 35.01 35.35 33.12    Physical Exam  Constitutional: She is oriented to person, place, and time. She appears well-developed. No distress.  Pulmonary/Chest: Effort normal.  Neurological: She  is alert and oriented to person, place, and time.  Skin:  Flushing of the hands noted. Dryness noted of the proximal palms.  Psychiatric:  Flat affect.  Vitals reviewed.   Lab Results  Component Value Date   WBC 5.2 08/06/2016   HGB 12.8 07/15/2012   HCT 36.4 08/06/2016   PLT 287 08/06/2016   GLUCOSE 104 (H) 07/15/2012   NA 138 07/15/2012   K 3.7 07/15/2012   CL 107 07/15/2012   CREATININE 0.90 07/15/2012   BUN 11 07/15/2012   CO2 22 07/15/2012    Assessment & Plan:   Problem List Items Addressed This Visit    Lower extremity edema    New problem. Secondary to recent medication, obesity, sedentary lifestyle. Advised compression and elevation.      Erythromelalgia ([place])    Worsening. Given numerous treatment options and rarity of this condition, I am referring her to a physician found on the Erythromelalgia association website.      Relevant Orders   Ambulatory referral to Neurology     Follow-up: PRN  [place]

## 2016-09-17 ENCOUNTER — Encounter: Payer: Self-pay | Admitting: Family Medicine

## 2016-10-22 ENCOUNTER — Ambulatory Visit (INDEPENDENT_AMBULATORY_CARE_PROVIDER_SITE_OTHER): Payer: 59 | Admitting: Family Medicine

## 2016-10-22 ENCOUNTER — Encounter: Payer: Self-pay | Admitting: Family Medicine

## 2016-10-22 VITALS — BP 120/79 | HR 105 | Temp 98.0°F | Resp 16 | Ht 64.0 in | Wt 210.2 lb

## 2016-10-22 DIAGNOSIS — R0789 Other chest pain: Secondary | ICD-10-CM

## 2016-10-22 DIAGNOSIS — R079 Chest pain, unspecified: Secondary | ICD-10-CM | POA: Insufficient documentation

## 2016-10-22 HISTORY — DX: Chest pain, unspecified: R07.9

## 2016-10-22 NOTE — Assessment & Plan Note (Signed)
New problem. EKG obtained today. Interpretation: Sinus tachycardia at a rate of 102. Normal intervals. No ST or T-wave changes. Tenderness on exam. Appears to be MSK/costochondritis. Advised over-the-counter ibuprofen as needed.

## 2016-10-22 NOTE — Progress Notes (Signed)
   Subjective:  Patient ID: [name], female    DOB: 08-29-1985  Age: 31 y.o. MRN: [id number]  CC: Chest pain  HPI:  31 year old female presents with complaints of chest pain.  Patient reports that she developed chest pain on Friday. Occurred while she was sitting down in a recliner. Described as sharp. Lasted briefly (seconds). No associated shortness of breath. Located centrally. No radiation. She has had the pain intermittently since then. Continues to be sharp and last for seconds. She thought that this may be from reflux so she tried Tums and a PPI with no improvement. No associated shortness of breath. No diaphoresis. No other associated symptoms. No other complaints or concerns at this time.  Social Hx   Social History   Social History  . Marital status: Single    Spouse name: N/A  . Number of children: N/A  . Years of education: N/A   Social History Main Topics  . Smoking status: Former Research scientist (life sciences)  . Smokeless tobacco: Never Used  . Alcohol use 0.0 oz/week     Comment: occasioanlly  . Drug use: No  . Sexual activity: Not Asked   Other Topics Concern  . None   Social History Narrative  . None   Review of Systems  Respiratory: Negative.   Cardiovascular: Positive for chest pain.   Objective:  BP 120/79   Pulse (!) 105   Temp 98 F (36.7 C) (Oral)   Resp 16   Ht 5\' 4"  (1.626 m)   Wt 210 lb 4 oz (95.4 kg)   LMP 09/22/2016   SpO2 100%   BMI 36.09 kg/m   BP/Weight 10/22/2016 09/20/6220 02/09/9891  Systolic BP 119 417 408  Diastolic BP 79 80 79  Wt. (Lbs) 210.25 210.4 212.4  BMI 36.09 35.01 35.35   Physical Exam  Constitutional: She is oriented to person, place, and time. She appears well-developed. No distress.  Cardiovascular: Normal rate and regular rhythm.   Pulmonary/Chest: Effort normal and breath sounds normal. She exhibits tenderness.  Neurological: She is alert and oriented to person, place, and time.  Psychiatric: She has a normal mood and  affect.  Vitals reviewed.   Lab Results  Component Value Date   WBC 5.2 08/06/2016   HGB 12.8 07/15/2012   HCT 36.4 08/06/2016   PLT 287 08/06/2016   GLUCOSE 104 (H) 07/15/2012   NA 138 07/15/2012   K 3.7 07/15/2012   CL 107 07/15/2012   CREATININE 0.90 07/15/2012   BUN 11 07/15/2012   CO2 22 07/15/2012    Assessment & Plan:   Problem List Items Addressed This Visit    Chest pain - Primary    New problem. EKG obtained today. Interpretation: Sinus tachycardia at a rate of 102. Normal intervals. No ST or T-wave changes. Tenderness on exam. Appears to be MSK/costochondritis. Advised over-the-counter ibuprofen as needed.      Relevant Orders   EKG 12-Lead (Completed)      Meds ordered this encounter  Medications  . SUMAtriptan (IMITREX) 25 MG tablet    Sig: TAKE 1 TABLET (25 MG TOTAL) BY MOUTH ONCE. MAY REPEAT IN 2 HOURS IF HEADACHE PERSISTS OR RECURS.    Refill:  2     Follow-up: PRN  [place]

## 2016-10-22 NOTE — Patient Instructions (Addendum)
Ibuprofen 800 mg three times daily as needed.  No need to worry.  Take care  Dr. Lacinda Axon

## 2016-10-31 ENCOUNTER — Encounter: Payer: Self-pay | Admitting: Family Medicine

## 2016-11-01 ENCOUNTER — Encounter: Payer: Self-pay | Admitting: Family Medicine

## 2016-11-02 ENCOUNTER — Other Ambulatory Visit: Payer: Self-pay | Admitting: Family Medicine

## 2016-11-02 ENCOUNTER — Encounter: Payer: Self-pay | Admitting: Family Medicine

## 2016-11-02 DIAGNOSIS — R079 Chest pain, unspecified: Secondary | ICD-10-CM

## 2016-11-05 ENCOUNTER — Encounter: Payer: Self-pay | Admitting: Family Medicine

## 2016-11-07 ENCOUNTER — Ambulatory Visit (INDEPENDENT_AMBULATORY_CARE_PROVIDER_SITE_OTHER): Payer: 59 | Admitting: Family Medicine

## 2016-11-07 ENCOUNTER — Emergency Department
Admission: EM | Admit: 2016-11-07 | Discharge: 2016-11-07 | Disposition: A | Discharge status: Home / Self Care | Payer: 59 | Attending: Emergency Medicine | Admitting: Emergency Medicine

## 2016-11-07 ENCOUNTER — Encounter: Payer: Self-pay | Admitting: Family Medicine

## 2016-11-07 ENCOUNTER — Encounter: Payer: Self-pay | Admitting: Emergency Medicine

## 2016-11-07 ENCOUNTER — Ambulatory Visit: Payer: 59

## 2016-11-07 ENCOUNTER — Emergency Department: Payer: 59

## 2016-11-07 VITALS — BP 120/80 | HR 117 | Temp 98.8°F | Wt 213.6 lb

## 2016-11-07 DIAGNOSIS — R079 Chest pain, unspecified: Secondary | ICD-10-CM

## 2016-11-07 DIAGNOSIS — R0789 Other chest pain: Secondary | ICD-10-CM | POA: Insufficient documentation

## 2016-11-07 DIAGNOSIS — Z87891 Personal history of nicotine dependence: Secondary | ICD-10-CM | POA: Diagnosis not present

## 2016-11-07 DIAGNOSIS — M7989 Other specified soft tissue disorders: Secondary | ICD-10-CM | POA: Diagnosis not present

## 2016-11-07 LAB — TROPONIN I

## 2016-11-07 LAB — CBC
HCT: 39.8 % (ref 35.0–47.0)
HEMOGLOBIN: 13.9 g/dL (ref 12.0–16.0)
MCH: 31 pg (ref 26.0–34.0)
MCHC: 34.9 g/dL (ref 32.0–36.0)
MCV: 88.8 fL (ref 80.0–100.0)
Platelets: 219 10*3/uL (ref 150–440)
RBC: 4.48 MIL/uL (ref 3.80–5.20)
RDW: 12.9 % (ref 11.5–14.5)
WBC: 4.2 10*3/uL (ref 3.6–11.0)

## 2016-11-07 LAB — BASIC METABOLIC PANEL
ANION GAP: 7 (ref 5–15)
BUN: 11 mg/dL (ref 6–20)
CALCIUM: 9 mg/dL (ref 8.9–10.3)
CO2: 25 mmol/L (ref 22–32)
Chloride: 109 mmol/L (ref 101–111)
Creatinine, Ser: 0.75 mg/dL (ref 0.44–1.00)
Glucose, Bld: 96 mg/dL (ref 65–99)
Potassium: 3.7 mmol/L (ref 3.5–5.1)
Sodium: 141 mmol/L (ref 135–145)

## 2016-11-07 LAB — FIBRIN DERIVATIVES D-DIMER (ARMC ONLY): Fibrin derivatives D-dimer (ARMC): 452.55 (ref 0.00–499.00)

## 2016-11-07 LAB — POCT URINE PREGNANCY: Preg Test, Ur: NEGATIVE

## 2016-11-07 MED ORDER — IBUPROFEN 800 MG PO TABS: 800.0000 mg | ORAL_TABLET | Freq: Three times a day (TID) | ORAL | 0 refills | Status: DC | PRN

## 2016-11-07 MED ORDER — IBUPROFEN 800 MG PO TABS
800.0000 mg | ORAL_TABLET | Freq: Once | ORAL | Status: AC
  Administered 2016-11-07: 800 mg via ORAL
  Filled 2016-11-07: qty 1

## 2016-11-07 MED ORDER — DIAZEPAM 5 MG PO TABS: 5.0000 mg | ORAL_TABLET | Freq: Three times a day (TID) | ORAL | 0 refills | Status: DC | PRN

## 2016-11-07 MED ORDER — FAMOTIDINE 20 MG PO TABS: 20.0000 mg | ORAL_TABLET | Freq: Two times a day (BID) | ORAL | 1 refills | Status: DC

## 2016-11-07 MED ORDER — DIAZEPAM 5 MG PO TABS
10.0000 mg | ORAL_TABLET | Freq: Once | ORAL | Status: AC
  Administered 2016-11-07: 10 mg via ORAL
  Filled 2016-11-07: qty 2

## 2016-11-07 NOTE — Assessment & Plan Note (Addendum)
Patient seen today for chest pain. Initially last episode she had was earlier this morning. Chest pain overall sounds atypical for cardiac cause. I had cardiology review the EKG that was performed today given the computer read of atrial flutter and they felt it was sinus tachycardia and there were no ischemic changes noted. Given her left leg swelling and her description of the chest pain the worry would be for a VTE as a cause. The plan initially was to complete a CT angiogram and lower extremity ultrasound though patient developed chest pain while in the office that is severe and was unrelenting. Given her current chest pain we discussed having her evaluated in the emergency room to expedite the workup and monitor her for a period of time. I discussed having her transported by EMS though she declined this and opted to drive herself. I did discuss the risk of transporting herself. CMA contacted the charge nurse and let them know the patient is on her way. She was given precautions to call EMS in route.

## 2016-11-07 NOTE — ED Provider Notes (Signed)
[facility] Emergency Department Provider Note       Time seen: ----------------------------------------- 5:01 PM on 11/07/2016 -----------------------------------------     I have reviewed the triage vital signs and the nursing notes.   HISTORY   Chief Complaint Chest Pain    HPI Laura Wiggins is a 31 y.o. female who presents to the ED for chest pain that the patient states began 2 weeks ago but has gotten much worse the past 2 days. Patient states pain feels like someone stabbing her in the right shoulder blade with a knife coming up the left side of her chest. Patient states pain is intermittent but his been lasting much longer. Patient states the pain woke her up out of sleep at 3 AM. Pain seems to alternate between sharp and a dull toothache. Early pain is 10 out of 10 in intensity.   Past Medical History:  Diagnosis Date  . Cancer ([place])    Melanoma  . Chicken pox   . Depression   . Endometriosis 2004  . Frequent headaches   . Hx of migraines   . Interstitial cystitis 2004  . Kidney stones   . Urinary incontinence     Patient Active Problem List   Diagnosis Date Noted  . Chest pain 10/22/2016  . Lower extremity edema 09/11/2016  . 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 Depression 12/05/2015  . Obesity 02/25/2015    Past Surgical History:  Procedure Laterality Date  . TONSILLECTOMY AND ADENOIDECTOMY  2013    Allergies Patient has no known allergies.  Social History Social History  Substance Use Topics  . Smoking status: Former Research scientist (life sciences)  . Smokeless tobacco: Never Used  . Alcohol use 0.0 oz/week     Comment: occasioanlly    Review of Systems Constitutional: Negative for fever. Eyes: Negative for vision changes ENT:  Negative for congestion, sore throat Cardiovascular: Positive for chest pain Respiratory: Negative for shortness of breath. Gastrointestinal: Negative for abdominal pain, vomiting and diarrhea. Genitourinary:  Negative for dysuria. Musculoskeletal: Negative for back pain. Skin: Negative for rash. Neurological: Negative for headaches, focal weakness or numbness.  All systems negative/normal/unremarkable except as stated in the HPI  ____________________________________________   PHYSICAL EXAM:  VITAL SIGNS: ED Triage Vitals  Enc Vitals Group     BP 11/07/16 1457 136/70     Pulse Rate 11/07/16 1457 (!) 106     Resp 11/07/16 1457 18     Temp 11/07/16 1457 98.4 F (36.9 C)     Temp Source 11/07/16 1457 Oral     SpO2 11/07/16 1457 100 %     Weight 11/07/16 1458 210 lb (95.3 kg)     Height 11/07/16 1458 5\' 4"  (1.626 m)     Head Circumference --      Peak Flow --      Pain Score 11/07/16 1457 10     Pain Loc --      Pain Edu? --      Excl. in [place]? --     Constitutional: Alert and oriented. Well appearing and in no distress. Eyes: Conjunctivae are Injected. Normal extraocular movements. ENT   Head: Normocephalic and atraumatic.   Nose: No congestion/rhinnorhea.   Mouth/Throat: Mucous membranes are moist.   Neck: No stridor. Cardiovascular: Normal rate, regular rhythm. No murmurs, rubs, or gallops. Respiratory: Normal respiratory effort without tachypnea nor retractions. Breath sounds are clear and equal bilaterally. No wheezes/rales/rhonchi. Gastrointestinal: Soft and nontender. Normal bowel sounds Musculoskeletal: Nontender with normal range of  motion in extremities. No lower extremity tenderness nor edema. There is muscular tenderness in the right periscapular area as well as left upper chest wall Neurologic:  Normal speech and language. No gross focal neurologic deficits are appreciated.  Skin:  Skin is warm, dry and intact. No rash noted. Psychiatric: Mood and affect are normal. Speech and behavior are normal.  ____________________________________________  EKG: Interpreted by me. Sinus tachycardia rate of 110 bpm, normal PR interval, normal QRS, normal  QT.  ____________________________________________  ED COURSE:  Pertinent labs & imaging results that were available during my care of the patient were reviewed by me and considered in my medical decision making (see chart for details). Patient presents for chest pain, we will assess with labs and imaging as indicated.   Procedures ____________________________________________   LABS (pertinent positives/negatives)  Labs Reviewed  BASIC METABOLIC PANEL  CBC  TROPONIN I  TROPONIN I  FIBRIN DERIVATIVES D-DIMER ([facility] ONLY)    RADIOLOGY  Chest x-ray is unremarkable  ____________________________________________  FINAL ASSESSMENT AND PLAN  Chest pain  Plan: Patient's labs and imaging were dictated above. Patient had presented for nonspecific chest pain for several weeks. Repeat troponin and d-dimer are both negative. She will be prescribed anti-inflammatory medicine as well as muscle relaxants and is encouraged to continue follow up with cardiology as scheduled.   Earleen Newport, MD   Note: This note was generated in part or whole with voice recognition software. Voice recognition is usually quite accurate but there are transcription errors that can and very often do occur. I apologize for any typographical errors that were not detected and corrected.     Earleen Newport, MD 11/07/16 [phone]

## 2016-11-07 NOTE — ED Triage Notes (Signed)
Patient presents to the ED with chest pain that patient states began two weeks ago but has gotten much worse in the past two days.  Patient states pain feels like someone stabbing her in the right shoulder blade with the knife coming out the left side of her chest.  Patient states pain is intermittent but has been lasting much longer than it was and patient states pain woke her up out of her sleep around 3am this morning.  Patient states when pain is not sharp it feels like a "dull toothache."

## 2016-11-07 NOTE — Progress Notes (Signed)
  Tommi Rumps, MD Phone: [phone]  Laura Wiggins is a 31 y.o. female who presents today for same-day visit.  Patient notes several week history of centralized chest discomfort. Initially evaluated by her PCP and felt to be musculoskeletal though persisted and was referred to cardiology. Has not seen them yet. Notes it started out in the center of her chest and has started to radiate over to her left chest and then over the last several days radiated to her right upper back. She notes some nausea with it. Describes it is a sharp pain when it is at its worst and then a dull toothache when it is at its best. It does come and go to some degree. She does note she has developed shortness of breath with it. Occasionally feels sweaty with it. Last episode was early this morning. She notes no hemoptysis. She does note swelling in her legs left significantly worse than right per her report. She notes no recent travel or surgeries. She notes no history of DVT. No history of diabetes, hypertension, or hyperlipidemia. She is not on oral contraceptives.  PMH: Former smoker   ROS see history of present illness  Objective  Physical Exam Vitals:   11/07/16 1311  BP: 120/80  Pulse: (!) 117  Temp: 98.8 F (37.1 C)    BP Readings from Last 3 Encounters:  11/07/16 (!) 102/57  11/07/16 120/80  10/22/16 120/79   Wt Readings from Last 3 Encounters:  11/07/16 210 lb (95.3 kg)  11/07/16 213 lb 9.6 oz (96.9 kg)  10/22/16 210 lb 4 oz (95.4 kg)    Physical Exam  Constitutional: No distress.  Cardiovascular: Regular rhythm and normal heart sounds.  Tachycardia present.   Pulmonary/Chest: Effort normal and breath sounds normal. She exhibits tenderness (slight tenderness over Center chest).  Abdominal: Soft. Bowel sounds are normal. She exhibits no distension. There is no tenderness. There is no rebound and no guarding.  Musculoskeletal: She exhibits edema (Swelling left ankle and foot).  Bilateral  calves 46 cm  Neurological: She is alert.  Skin: Skin is warm and dry. She is not diaphoretic.   EKG: Sinus tachycardia, negative precordial T waves, abnormal precordial QRS contours  Assessment/Plan: Please see individual problem list.  Chest pain Patient seen today for chest pain. Initially last episode she had was earlier this morning. Chest pain overall sounds atypical for cardiac cause. I had cardiology review the EKG that was performed today given the computer read of atrial flutter and they felt it was sinus tachycardia and there were no ischemic changes noted. Given her left leg swelling and her description of the chest pain the worry would be for a VTE as a cause. The plan initially was to complete a CT angiogram and lower extremity ultrasound though patient developed chest pain while in the office that is severe and was unrelenting. Given her current chest pain we discussed having her evaluated in the emergency room to expedite the workup and monitor her for a period of time. I discussed having her transported by EMS though she declined this and opted to drive herself. I did discuss the risk of transporting herself. CMA contacted the charge nurse and let them know the patient is on her way. She was given precautions to call EMS in route.   Orders Placed This Encounter  Procedures  . POCT urine pregnancy  . EKG 12-Lead   Tommi Rumps, MD [place]

## 2016-11-07 NOTE — Patient Instructions (Signed)
Nice to see you. Your chest pain is concerning for a blood clot in given that year currently tachycardic and having some chest pain I have advised to go to the emergency room. If you develop worsening symptoms or develop trouble breathing or any new or changing symptoms please call 911 to transport her to the emergency room.

## 2016-11-08 ENCOUNTER — Ambulatory Visit: Payer: 59 | Admitting: Family Medicine

## 2016-11-08 ENCOUNTER — Encounter: Payer: Self-pay | Admitting: Family Medicine

## 2016-11-08 ENCOUNTER — Other Ambulatory Visit: Payer: Self-pay | Admitting: Family Medicine

## 2016-11-08 MED ORDER — ONDANSETRON HCL 4 MG PO TABS: 4.0000 mg | ORAL_TABLET | Freq: Three times a day (TID) | ORAL | 0 refills | Status: DC | PRN

## 2016-11-28 ENCOUNTER — Encounter: Payer: Self-pay | Admitting: Family Medicine

## 2016-12-03 ENCOUNTER — Ambulatory Visit: Payer: 59 | Admitting: Cardiovascular Disease

## 2016-12-03 NOTE — Telephone Encounter (Signed)
I had rescheduled her cardiology appt to today with Dr. Rockey Situ, they had created a spot for her to be seen. Harli could not come today due to her work schedule. They put her appt back to Aug. 1 with Dr. Saunders Revel. They said that is the first available appt but they have her on the cancellation list.

## 2017-01-02 ENCOUNTER — Ambulatory Visit: Payer: 59 | Admitting: Internal Medicine

## 2017-01-03 ENCOUNTER — Other Ambulatory Visit: Payer: Self-pay

## 2017-01-03 MED ORDER — BUPROPION HCL ER (XL) 300 MG PO TB24: 300.0000 mg | ORAL_TABLET | Freq: Every day | ORAL | 0 refills | Status: DC

## 2017-01-17 ENCOUNTER — Telehealth: Payer: Self-pay

## 2017-01-17 NOTE — Telephone Encounter (Signed)
Pt states she has appt with [facility] on 02/11/17 to have IUD removed. This is her second mirena and she is experiencing nausea, feels sick after eating, bloating, fatigue and weight gain. She took a home UPT and it was negative but she is concerned that the IUD is causing these symptoms. Advised to schedule appt for removal and see if symptoms resolve. Pt transferred to front desk for scheduling.

## 2017-01-21 ENCOUNTER — Ambulatory Visit (INDEPENDENT_AMBULATORY_CARE_PROVIDER_SITE_OTHER): Payer: 59 | Admitting: Obstetrics and Gynecology

## 2017-01-21 ENCOUNTER — Encounter: Payer: Self-pay | Admitting: Obstetrics and Gynecology

## 2017-01-21 VITALS — BP 100/78 | HR 99 | Wt 210.0 lb

## 2017-01-21 DIAGNOSIS — Z30432 Encounter for removal of intrauterine contraceptive device: Secondary | ICD-10-CM

## 2017-01-22 NOTE — Progress Notes (Signed)
   GYNECOLOGY OFFICE PROCEDURE NOTE  Laura Wiggins is a 31 y.o. No obstetric history on file. here for Mirena IUD removal placed 5 years ago. She desires removal secondary to wanting to switch to OCP..  IUD Removal  Patient identified, informed consent performed, consent signed.  Patient was in the dorsal lithotomy position, normal external genitalia was noted.  A speculum was placed in the patient's vagina, normal discharge was noted, no lesions. The cervix was visualized, no lesions, no abnormal discharge.  The strings of the IUD were grasped and pulled using ring forceps. The IUD was removed in its entirety.  Patient tolerated the procedure well.    Patient will use nothing for contraception, interested in pursuing pregnancy.  Start [place].  Routine preventative health maintenance measures emphasized.   Malachy Mood, MD, [name], [place]

## 2017-01-29 ENCOUNTER — Encounter: Payer: Self-pay | Admitting: Obstetrics and Gynecology

## 2017-02-11 ENCOUNTER — Ambulatory Visit: Payer: Self-pay | Admitting: Obstetrics & Gynecology

## 2017-02-19 ENCOUNTER — Encounter: Payer: Self-pay | Admitting: Obstetrics & Gynecology

## 2017-02-19 ENCOUNTER — Ambulatory Visit (INDEPENDENT_AMBULATORY_CARE_PROVIDER_SITE_OTHER): Payer: 59 | Admitting: Obstetrics & Gynecology

## 2017-02-19 VITALS — BP 110/70 | HR 82 | Ht 65.0 in | Wt 215.0 lb

## 2017-02-19 DIAGNOSIS — F329 Major depressive disorder, single episode, unspecified: Secondary | ICD-10-CM | POA: Diagnosis not present

## 2017-02-19 DIAGNOSIS — Z0000 Encounter for general adult medical examination without abnormal findings: Secondary | ICD-10-CM

## 2017-02-19 DIAGNOSIS — Z01419 Encounter for gynecological examination (general) (routine) without abnormal findings: Secondary | ICD-10-CM | POA: Diagnosis not present

## 2017-02-19 DIAGNOSIS — F32A Depression, unspecified: Secondary | ICD-10-CM

## 2017-02-19 MED ORDER — BUPROPION HCL ER (XL) 150 MG PO TB24: 150.0000 mg | ORAL_TABLET | Freq: Every day | ORAL | 11 refills | Status: DC

## 2017-02-19 NOTE — Progress Notes (Signed)
HPI:      [name] is a 31 y.o. G2P1011 who LMP was Patient's last menstrual period was 02/16/2017., she presents today for her annual examination. The patient has no complaints today. The patient is sexually active. Her last pap: approximate date 06/2015 and was normal. The patient does perform self breast exams.  There is no notable family history of breast or ovarian cancer in her family.  The patient has regular exercise: yes.  The patient reports current symptoms of depression that is well controlled with Wellbutrin 300 mg.  GYN History: Contraception: none     Desires to try for pregnancy (IUD use last 10 years w rare bleeding; irreg periods prior to [place] use; Mirena removes last month).  PMHx: Past Medical History:  Diagnosis Date  . Cancer ([place])    Melanoma  . Chicken pox   . Depression   . Endometriosis 2004  . Frequent headaches   . Hx of migraines   . Interstitial cystitis 2004  . Kidney stones   . Urinary incontinence    Past Surgical History:  Procedure Laterality Date  . TONSILLECTOMY AND ADENOIDECTOMY  2013   Family History  Problem Relation Age of Onset  . Hyperlipidemia Mother   . Hypertension Mother   . Diabetes Mother    Social History  Substance Use Topics  . Smoking status: Former Research scientist (life sciences)  . Smokeless tobacco: Never Used  . Alcohol use 0.0 oz/week     Comment: occasioanlly    Current Outpatient Prescriptions:  .  AMITRIPTYLINE HCL PO, Apply to affected areas BID, Disp: , Rfl:  .  buPROPion (WELLBUTRIN XL) 150 MG 24 hr tablet, Take 1 tablet (150 mg total) by mouth daily., Disp: 30 tablet, Rfl: 11 .  calcipotriene-betamethasone (TACLONEX SCALP) external suspension, Apply to the rough areas on the  hands and feet each night., Disp: , Rfl:  .  clobetasol cream (TEMOVATE) 0.05 %, Apply to the hands and feet each morning., Disp: , Rfl:  .  famotidine (PEPCID) 20 MG tablet, Take 1 tablet (20 mg total) by mouth 2 (two) times daily., Disp: 60  tablet, Rfl: 1 .  Multiple Vitamins-Minerals (MULTIVITAMIN WITH MINERALS) tablet, Take 1 tablet by mouth daily., Disp: , Rfl:  .  SUMAtriptan (IMITREX) 25 MG tablet, TAKE 1 TABLET (25 MG TOTAL) BY MOUTH ONCE. MAY REPEAT IN 2 HOURS IF HEADACHE PERSISTS OR RECURS., Disp: , Rfl: 2 .  Urea 39 % CREA, APPLY TO PALMS AND SOLES EACH AFTERNOON. 40% LOTION NO LONGER ON MKT, Disp: , Rfl: 3 Allergies: Patient has no known allergies.  Review of Systems  Constitutional: Negative for chills, fever and malaise/fatigue.  HENT: Negative for congestion, sinus pain and sore throat.   Eyes: Negative for blurred vision and pain.  Respiratory: Negative for cough and wheezing.   Cardiovascular: Negative for chest pain and leg swelling.  Gastrointestinal: Negative for abdominal pain, constipation, diarrhea, heartburn, nausea and vomiting.  Genitourinary: Negative for dysuria, frequency, hematuria and urgency.  Musculoskeletal: Negative for back pain, joint pain, myalgias and neck pain.  Skin: Negative for itching and rash.  Neurological: Negative for dizziness, tremors and weakness.  Endo/Heme/Allergies: Does not bruise/bleed easily.  Psychiatric/Behavioral: Negative for depression. The patient is not nervous/anxious and does not have insomnia.    Objective: BP 110/70   Pulse 82   Ht 5\' 5"  (1.651 m)   Wt 215 lb (97.5 kg)   LMP 02/16/2017   BMI 35.78 kg/m   Filed  Weights   02/19/17 1550  Weight: 215 lb (97.5 kg)   Body mass index is 35.78 kg/m. Physical Exam  Constitutional: She is oriented to person, place, and time. She appears well-developed and well-nourished. No distress.  Genitourinary: Rectum normal, vagina normal and uterus normal. Pelvic exam was performed with patient supine. There is no rash or lesion on the right labia. There is no rash or lesion on the left labia. Vagina exhibits no lesion. No bleeding in the vagina. Right adnexum does not display mass and does not display tenderness. Left  adnexum does not display mass and does not display tenderness. Cervix does not exhibit motion tenderness, lesion, friability or polyp.   Uterus is mobile and midaxial. Uterus is not enlarged or exhibiting a mass.  HENT:  Head: Normocephalic and atraumatic. Head is without laceration.  Right Ear: Hearing normal.  Left Ear: Hearing normal.  Nose: No epistaxis.  No foreign bodies.  Mouth/Throat: Uvula is midline, oropharynx is clear and moist and mucous membranes are normal.  Eyes: Pupils are equal, round, and reactive to light.  Neck: Normal range of motion. Neck supple. No thyromegaly present.  Cardiovascular: Normal rate and regular rhythm.  Exam reveals no gallop and no friction rub.   No murmur heard. Pulmonary/Chest: Effort normal and breath sounds normal. No respiratory distress. She has no wheezes. Right breast exhibits no mass, no skin change and no tenderness. Left breast exhibits no mass, no skin change and no tenderness.  Abdominal: Soft. Bowel sounds are normal. She exhibits no distension. There is no tenderness. There is no rebound.  Musculoskeletal: Normal range of motion.  Neurological: She is alert and oriented to person, place, and time. No cranial nerve deficit.  Skin: Skin is warm and dry.  Psychiatric: She has a normal mood and affect. Judgment normal.  Vitals reviewed.   Assessment:  ANNUAL EXAM 1. Annual physical exam   2. Depression, unspecified depression type     Screening Plan:            1.  Cervical Screening-  Pap smear schedule reviewed with patient  2. Breast screening- Exam annually and mammogram>40 planned   3. Colonoscopy every 10 years, Hemoccult testing - after age 38  4. Labs managed by PCP  5. Counseling for contraception: no method  Other:  1. Annual physical exam  2. Depression, unspecified depression type Decrease dose to 150 mg daily. Consider taper off.  Pregnancy and depression/ Wellbutrin discussed  3. Preconceptual  counseling     F/U  Return in about 1 year (around 02/19/2018) for Annual.  Barnett Applebaum, MD, [name], [facility] 02/19/2017  4:26 PM

## 2017-02-21 ENCOUNTER — Other Ambulatory Visit: Payer: Self-pay | Admitting: Family Medicine

## 2017-02-21 ENCOUNTER — Encounter: Payer: Self-pay | Admitting: Family Medicine

## 2017-02-21 MED ORDER — AMOXICILLIN-POT CLAVULANATE 875-125 MG PO TABS: 1.0000 | ORAL_TABLET | Freq: Two times a day (BID) | ORAL | 0 refills | Status: DC

## 2017-03-02 ENCOUNTER — Other Ambulatory Visit: Payer: Self-pay | Admitting: Family Medicine

## 2017-03-14 ENCOUNTER — Ambulatory Visit (INDEPENDENT_AMBULATORY_CARE_PROVIDER_SITE_OTHER): Payer: 59 | Admitting: Family

## 2017-03-14 ENCOUNTER — Encounter: Payer: Self-pay | Admitting: Family

## 2017-03-14 DIAGNOSIS — E669 Obesity, unspecified: Secondary | ICD-10-CM | POA: Diagnosis not present

## 2017-03-14 DIAGNOSIS — Z23 Encounter for immunization: Secondary | ICD-10-CM

## 2017-03-14 NOTE — Progress Notes (Signed)
Pre visit review using our clinic review tool, if applicable. No additional management support is needed unless otherwise documented below in the visit note. 

## 2017-03-14 NOTE — Progress Notes (Signed)
Subjective:    Patient ID: Laura Wiggins, female    DOB: Apr 28, 1986, 31 y.o.   MRN: [id number]  CC: MAO LOCKNER is a 31 y.o. female who presents today for follow up.   HPI:   Here for [employer] form for weight loss. Feels well. Walks after work.   Brings health dashboard report with her with cholesterol and a1c levels.   Reports faint line of pregnancy yesterday. Has been trying to conceive for 2 months. Menstrual cycle would be due next week. On PNV. No vaginal bleeding or pelvic pain.      HISTORY:  Past Medical History:  Diagnosis Date  . Cancer ([place])    Melanoma  . Chicken pox   . Depression   . Endometriosis 2004  . Frequent headaches   . Hx of migraines   . Interstitial cystitis 2004  . Kidney stones   . Urinary incontinence    Past Surgical History:  Procedure Laterality Date  . TONSILLECTOMY AND ADENOIDECTOMY  2013   Family History  Problem Relation Age of Onset  . Hyperlipidemia Mother   . Hypertension Mother   . Diabetes Mother     Allergies: Patient has no known allergies. Current Outpatient Prescriptions on File Prior to Visit  Medication Sig Dispense Refill  . AMITRIPTYLINE HCL PO Apply to affected areas BID    . amoxicillin-clavulanate (AUGMENTIN) 875-125 MG tablet Take 1 tablet by mouth 2 (two) times daily. 20 tablet 0  . buPROPion (WELLBUTRIN XL) 150 MG 24 hr tablet Take 1 tablet (150 mg total) by mouth daily. 30 tablet 11  . calcipotriene-betamethasone (TACLONEX SCALP) external suspension Apply to the rough areas on the  hands and feet each night.    . clobetasol cream (TEMOVATE) 0.05 % Apply to the hands and feet each morning.    . famotidine (PEPCID) 20 MG tablet Take 1 tablet (20 mg total) by mouth 2 (two) times daily. 60 tablet 1  . Multiple Vitamins-Minerals (MULTIVITAMIN WITH MINERALS) tablet Take 1 tablet by mouth daily.    . SUMAtriptan (IMITREX) 25 MG tablet TAKE 1 TABLET (25 MG TOTAL) BY MOUTH ONCE. MAY REPEAT IN 2 HOURS IF  HEADACHE PERSISTS OR RECURS.  2  . Urea 39 % CREA APPLY TO PALMS AND SOLES EACH AFTERNOON. 40% LOTION NO LONGER ON MKT  3   No current facility-administered medications on file prior to visit.     Social History  Substance Use Topics  . Smoking status: Former Research scientist (life sciences)  . Smokeless tobacco: Never Used  . Alcohol use 0.0 oz/week     Comment: occasioanlly    Review of Systems  Constitutional: Negative for chills and fever.  Respiratory: Negative for cough.   Cardiovascular: Negative for chest pain and palpitations.  Gastrointestinal: Negative for nausea and vomiting.  Genitourinary: Negative for vaginal bleeding.      Objective:    BP 104/64   Pulse 100   Temp 98.8 F (37.1 C) (Oral)   Ht 5\' 5"  (1.651 m)   Wt 210 lb 3.2 oz (95.3 kg)   LMP 02/16/2017 (Exact Date)   SpO2 99%   BMI 34.98 kg/m  BP Readings from Last 3 Encounters:  03/14/17 104/64  02/19/17 110/70  01/21/17 100/78   Wt Readings from Last 3 Encounters:  03/14/17 210 lb 3.2 oz (95.3 kg)  02/19/17 215 lb (97.5 kg)  01/21/17 210 lb (95.3 kg)    Physical Exam  Constitutional: She appears well-developed and well-nourished.  Eyes:  Conjunctivae are normal.  Cardiovascular: Normal rate, regular rhythm, normal heart sounds and normal pulses.   Pulmonary/Chest: Effort normal and breath sounds normal. She has no wheezes. She has no rhonchi. She has no rales.  Neurological: She is alert.  Skin: Skin is warm and dry.  Psychiatric: She has a normal mood and affect. Her speech is normal and behavior is normal. Thought content normal.  Vitals reviewed.      Assessment & Plan:   Problem List Items Addressed This Visit      Other   Obesity    Discussed lifestyle changes including walking program. Also discussed Weight Watchers. Reviewed cholesterol labs the patient.Advised her on diet changes to increase HDL. Today patient reports positive home pregnancy. In this context, I advised patient that she needs to  follow-up with her OB/GYN for further confirmation, surveillance. I advised her to discuss with Dr. Kenton Kingfisher as she has in the past, current medication regimen including Wellbutrin, Imitrex. atient verbalized understanding.       Other Visit Diagnoses    Need for immunization against influenza       Relevant Orders   Flu Vaccine QUAD 36+ mos IM (Completed)       I am having Ms. Wynes maintain her multivitamin with minerals, SUMAtriptan, famotidine, AMITRIPTYLINE HCL PO, calcipotriene-betamethasone, clobetasol cream, Urea, buPROPion, and amoxicillin-clavulanate.   No orders of the defined types were placed in this encounter.   Return precautions given.   Risks, benefits, and alternatives of the medications and treatment plan prescribed today were discussed, and patient expressed understanding.   Education regarding symptom management and diagnosis given to patient on AVS.  Continue to follow with Coral Spikes, DO for routine health maintenance.   Laura Wiggins and I agreed with plan.   Mable Paris, FNP

## 2017-03-14 NOTE — Patient Instructions (Signed)
Early congrats!  Please ensure you are very much in touch with Dr Kenton Kingfisher regarding medications you are on - including wellbutrin, imitrex  Continue prenatal  As discussed, your healthy cholesterol is low so I would tell you to eat more healthy 'fats' such as avocados, nuts, beans, and olive oil in moderation of course.   Good luck with walking program!

## 2017-03-14 NOTE — Assessment & Plan Note (Addendum)
Discussed lifestyle changes including walking program. Also discussed Weight Watchers. Reviewed cholesterol labs the patient.Advised her on diet changes to increase HDL. Today patient reports positive home pregnancy. In this context, I advised patient that she needs to follow-up with her OB/GYN for further confirmation, surveillance. I advised her to discuss with Dr. Kenton Kingfisher as she has in the past, current medication regimen including Wellbutrin, Imitrex. atient verbalized understanding.

## 2017-03-28 ENCOUNTER — Ambulatory Visit (INDEPENDENT_AMBULATORY_CARE_PROVIDER_SITE_OTHER): Payer: 59 | Admitting: Obstetrics & Gynecology

## 2017-03-28 ENCOUNTER — Encounter: Payer: Self-pay | Admitting: Obstetrics & Gynecology

## 2017-03-28 VITALS — BP 100/60 | Wt 214.0 lb

## 2017-03-28 DIAGNOSIS — Z3201 Encounter for pregnancy test, result positive: Secondary | ICD-10-CM

## 2017-03-28 LAB — POCT URINE PREGNANCY: Preg Test, Ur: POSITIVE — AB

## 2017-03-28 MED ORDER — BUTALBITAL-APAP-CAFFEINE 50-325-40 MG PO CAPS: 1.0000 | ORAL_CAPSULE | Freq: Four times a day (QID) | ORAL | 3 refills | Status: DC | PRN

## 2017-03-28 NOTE — Progress Notes (Signed)
03/28/2017   Chief Complaint: Missed period  Transfer of Care Patient: no  History of Present Illness: [name] is a 31 y.o. G3P1011 [redacted]w[redacted]d based on Patient's last menstrual period was 02/16/2017. with an Estimated Date of Delivery: 11/23/17, with the above CC.   Her periods were: regular periods every 28 days She was using no method when she conceived. IUD removed in August, She has Negative signs or symptoms of nausea/vomiting of pregnancy. She has Negative signs or symptoms of miscarriage or preterm labor She identifies Negative Zika risk factors for her and her partner On any different medications around the time she conceived/early pregnancy: Yes. Wellbutrin. History of varicella: Yes   ROS: A 12-point review of systems was performed and negative, except as stated in the above HPI.  OBGYN History: As per HPI. OB History  Gravida Para Term Preterm AB Living  3 1 1   1 1   SAB TAB Ectopic Multiple Live Births               # Outcome Date GA Lbr Len/2nd Weight Sex Delivery Anes PTL Lv  3 Current           2 AB           1 Term              Any issues with any prior pregnancies: Preeclampsia and Preterm Labor (spon) Any prior children are healthy, doing well, without any problems or issues: yes History of pap smears: Yes. Last pap smear 2018. Abnormal: no  History of STIs: No   Past Medical History: Past Medical History:  Diagnosis Date  . Cancer ([place])    Melanoma  . Chicken pox   . Depression   . Endometriosis 2004  . Frequent headaches   . Hx of migraines   . Interstitial cystitis 2004  . Kidney stones   . Urinary incontinence     Past Surgical History: Past Surgical History:  Procedure Laterality Date  . TONSILLECTOMY AND ADENOIDECTOMY  2013    Family History:  Family History  Problem Relation Age of Onset  . Hyperlipidemia Mother   . Hypertension Mother   . Diabetes Mother    She denies any female cancers, bleeding or blood clotting disorders.    She denies any history of mental retardation, birth defects or genetic disorders in her or the FOB's history  Social History:  Social History   Social History  . Marital status: Single    Spouse name: N/A  . Number of children: N/A  . Years of education: N/A   Occupational History  . Not on file.   Social History Main Topics  . Smoking status: Former Research scientist (life sciences)  . Smokeless tobacco: Never Used  . Alcohol use 0.0 oz/week     Comment: occasioanlly  . Drug use: No  . Sexual activity: Yes    Birth control/ protection: None   Other Topics Concern  . Not on file   Social History Narrative  . No narrative on file   Any pets in the household: no  Allergy: No Known Allergies  Current Outpatient Medications:  Current Outpatient Prescriptions:  .  buPROPion (WELLBUTRIN XL) 150 MG 24 hr tablet, Take 1 tablet (150 mg total) by mouth daily., Disp: 30 tablet, Rfl: 11 .  AMITRIPTYLINE HCL PO, Apply to affected areas BID, Disp: , Rfl:  .  amoxicillin-clavulanate (AUGMENTIN) 875-125 MG tablet, Take 1 tablet by mouth 2 (two) times daily. (Patient not taking: Reported  on 03/28/2017), Disp: 20 tablet, Rfl: 0 .  calcipotriene-betamethasone (TACLONEX SCALP) external suspension, Apply to the rough areas on the  hands and feet each night., Disp: , Rfl:  .  clobetasol cream (TEMOVATE) 0.05 %, Apply to the hands and feet each morning., Disp: , Rfl:  .  famotidine (PEPCID) 20 MG tablet, Take 1 tablet (20 mg total) by mouth 2 (two) times daily. (Patient not taking: Reported on 03/28/2017), Disp: 60 tablet, Rfl: 1 .  Multiple Vitamins-Minerals (MULTIVITAMIN WITH MINERALS) tablet, Take 1 tablet by mouth daily., Disp: , Rfl:  .  SUMAtriptan (IMITREX) 25 MG tablet, TAKE 1 TABLET (25 MG TOTAL) BY MOUTH ONCE. MAY REPEAT IN 2 HOURS IF HEADACHE PERSISTS OR RECURS., Disp: , Rfl: 2 .  Urea 39 % CREA, APPLY TO PALMS AND SOLES EACH AFTERNOON. 40% LOTION NO LONGER ON MKT, Disp: , Rfl: 3   Physical Exam:   BP  100/60   Wt 214 lb (97.1 kg)   LMP 02/16/2017   BMI 35.61 kg/m  Body mass index is 35.61 kg/m. Constitutional: Well nourished, well developed female in no acute distress.  Neck:  Supple, normal appearance, and no thyromegaly  Cardiovascular: S1, S2 normal, no murmur, rub or gallop, regular rate and rhythm Respiratory:  Clear to auscultation bilateral. Normal respiratory effort Abdomen: positive bowel sounds and no masses, hernias; diffusely non tender to palpation, non distended Breasts: breasts appear normal, no suspicious masses, no skin or nipple changes or axillary nodes. Neuro/Psych:  Normal mood and affect.  Skin:  Warm and dry.  Lymphatic:  No inguinal lymphadenopathy.   Pelvic exam: is not limited by body habitus EGBUS: within normal limits, Vagina: within normal limits and with no blood in the vault, Cervix: normal appearing cervix without discharge or lesions, closed/long/high, Uterus:  enlarged: 8 weeks, and Adnexa:  normal adnexa  Assessment: [name] is a 31 y.o. G3P1011 [redacted]w[redacted]d based on Patient's last menstrual period was 02/16/2017. with an Estimated Date of Delivery: 11/23/17,  for prenatal care.  Plan:  1) Avoid alcoholic beverages. 2) Patient encouraged not to smoke.  3) Discontinue the use of all non-medicinal drugs and chemicals.  4) Take prenatal vitamins daily.  5) Seatbelt use advised 6) Nutrition, food safety (fish, cheese advisories, and high nitrite foods) and exercise discussed. 7) Hospital and practice style delivering at [facility] discussed  8) Patient is asked about travel to areas at risk for the [place] virus, and counseled to avoid travel and exposure to mosquitoes or sexual partners who may have themselves been exposed to the virus. Testing is discussed, and will be ordered as appropriate.  9) Childbirth classes at [facility] advised 10) Genetic Screening, such as with 1st Trimester Screening, cell free fetal DNA, AFP testing, and Ultrasound, as well as with  amniocentesis and CVS as appropriate, is discussed with patient. She plans to have genetic testing this pregnancy. 11) H/O PTL/PTD at 36 weeks, thinks it was not related to her mild preeclampsia at the time.  Progesterone injections ([place]) discussed as prevention of PTL, to consider.  May also help to have records of antepartum PTL last pregnancy to help determine true need for prophylaxis.  Delivery 10 years ago at [facility]. 12) [place] nv and if viable then labs at that time. Urine and Aptima today, PAP 06/2015 normal.  Problem list reviewed and updated.  Barnett Applebaum, MD, [name], [facility] 03/28/2017  5:07 PM

## 2017-04-01 ENCOUNTER — Encounter: Payer: Self-pay | Admitting: Obstetrics & Gynecology

## 2017-04-02 LAB — URINE CULTURE

## 2017-04-02 LAB — GC/CHLAMYDIA PROBE AMP
CHLAMYDIA, DNA PROBE: NEGATIVE
NEISSERIA GONORRHOEAE BY PCR: NEGATIVE

## 2017-04-05 ENCOUNTER — Ambulatory Visit (INDEPENDENT_AMBULATORY_CARE_PROVIDER_SITE_OTHER): Payer: 59 | Admitting: Obstetrics and Gynecology

## 2017-04-05 ENCOUNTER — Ambulatory Visit (INDEPENDENT_AMBULATORY_CARE_PROVIDER_SITE_OTHER): Payer: 59

## 2017-04-05 VITALS — BP 114/70 | Wt 222.0 lb

## 2017-04-05 DIAGNOSIS — O09219 Supervision of pregnancy with history of pre-term labor, unspecified trimester: Secondary | ICD-10-CM

## 2017-04-05 DIAGNOSIS — O09899 Supervision of other high risk pregnancies, unspecified trimester: Secondary | ICD-10-CM

## 2017-04-05 DIAGNOSIS — R8271 Bacteriuria: Secondary | ICD-10-CM | POA: Insufficient documentation

## 2017-04-05 DIAGNOSIS — O99210 Obesity complicating pregnancy, unspecified trimester: Secondary | ICD-10-CM

## 2017-04-05 DIAGNOSIS — F329 Major depressive disorder, single episode, unspecified: Secondary | ICD-10-CM

## 2017-04-05 DIAGNOSIS — Z3A01 Less than 8 weeks gestation of pregnancy: Secondary | ICD-10-CM

## 2017-04-05 DIAGNOSIS — Z362 Encounter for other antenatal screening follow-up: Secondary | ICD-10-CM | POA: Diagnosis not present

## 2017-04-05 DIAGNOSIS — Z3201 Encounter for pregnancy test, result positive: Secondary | ICD-10-CM

## 2017-04-05 DIAGNOSIS — F32A Depression, unspecified: Secondary | ICD-10-CM

## 2017-04-05 DIAGNOSIS — O0990 Supervision of high risk pregnancy, unspecified, unspecified trimester: Secondary | ICD-10-CM | POA: Insufficient documentation

## 2017-04-05 DIAGNOSIS — Z6835 Body mass index (BMI) 35.0-35.9, adult: Secondary | ICD-10-CM | POA: Insufficient documentation

## 2017-04-05 DIAGNOSIS — O99211 Obesity complicating pregnancy, first trimester: Secondary | ICD-10-CM

## 2017-04-05 HISTORY — DX: Supervision of high risk pregnancy, unspecified, unspecified trimester: O09.90

## 2017-04-05 HISTORY — DX: Bacteriuria: R82.71

## 2017-04-05 HISTORY — DX: Supervision of other high risk pregnancies, unspecified trimester: O09.899

## 2017-04-05 HISTORY — DX: Obesity complicating pregnancy, unspecified trimester: O99.210

## 2017-04-05 MED ORDER — AMOXICILLIN 875 MG PO TABS: 875.0000 mg | ORAL_TABLET | Freq: Two times a day (BID) | ORAL | 0 refills | Status: AC

## 2017-04-05 NOTE — Progress Notes (Signed)
Routine Prenatal Care Visit  Subjective  Laura Wiggins is a 31 y.o. G3P1011 at [redacted]w[redacted]d being seen today for ongoing prenatal care.  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); Lower extremity edema; Chest pain; Supervision of high risk pregnancy, antepartum; History of preterm delivery, currently pregnant; Obesity complicating pregnancy; BMI 35.0-35.9,adult; and GBS bacteriuria on her problem list.  ----------------------------------------------------------------------------------- Patient reports no complaints.    . Vag. Bleeding: None.   . Denies leaking of fluid.  Some constipation.  U/S confirms EDD.  ----------------------------------------------------------------------------------- The following portions of the patient's history were reviewed and updated as appropriate: allergies, current medications, past family history, past medical history, past social history, past surgical history and problem list. Problem list updated.  Objective  Blood pressure 114/70, weight 222 lb (100.7 kg), last menstrual period 02/16/2017. Pregravid weight 214 lb (97.1 kg) Total Weight Gain 8 lb (3.629 kg) Urinalysis:      Fetal Status: Fetal Heart Rate (bpm): Present         General:  Alert, oriented and cooperative. Patient is in no acute distress.  Skin: Skin is warm and dry. No rash noted.   Cardiovascular: Normal heart rate noted  Respiratory: Normal respiratory effort, no problems with respiration noted  Abdomen: Soft, gravid, appropriate for gestational age. Pain/Pressure: Absent     Pelvic:  Cervical exam deferred        Extremities: Normal range of motion.     Mental Status: Normal mood and affect. Normal behavior. Normal judgment and thought content.   Assessment   31 y.o. G3P1011 at [redacted]w[redacted]d by  11/23/2017, by Last Menstrual Period presenting for routine prenatal visit  Plan   pregnancy3 Problems (from  02/16/17 to present)    Problem Noted Resolved   Supervision of high risk pregnancy, antepartum 04/05/2017 by Will Bonnet, MD No   Overview Signed 04/05/2017 12:39 PM by Will Bonnet, MD    [facility]inic [facility] Prenatal Labs  Dating L=6 Blood type:     Genetic Screen 1 Screen: [ ]  deciding   AFP:     Quad:     NIPS: Antibody:   Anatomic [place]  Rubella:   Varicella: @VZVIGG @  GTT Early: []  order at nv              Third trimester:  RPR:     Rhogam  HBsAg:     TDaP vaccine                       Flu Shot: HIV:     Baby Food                                GBS:   Contraception  Pap:  CBB     CS/VBAC    Support Person         History of preterm delivery, currently pregnant 04/05/2017 by Will Bonnet, MD No   Overview Signed 04/05/2017 12:39 PM by Will Bonnet, MD    [ ]  Discussed 17 OHPC. Patient deciding      Obesity complicating pregnancy 01/08/5783 by Will Bonnet, MD No   Overview Signed 04/05/2017 12:40 PM by Will Bonnet, MD    [ ]  early 1h gtt - needs to be ordered      BMI 35.0-35.9,adult 04/05/2017 by Will Bonnet, MD No  GBS bacteriuria 04/05/2017 by Will Bonnet, MD No   Overview Addendum 04/05/2017 12:41 PM by Will Bonnet, MD    [ ]  treated with amoxicillin in early pregnancy (11/2) [ ]  tx for GBS in labor      Depression 12/05/2015 by Coral Spikes, DO No   Overview Signed 04/05/2017 12:40 PM by Will Bonnet, MD    [ ]  Needs baseline EPDS       - NOB labs ordered today - treated GBS bacteriuria today - Samples give for PNV with docusate today (Citrinatal) - discussed Down screening today. Patient deciding between standard NT screen vs NIPT - Discussed 28 OHPC treatment with her today. She is considering.   - discussed not taking imitrex in pregnancy  Please refer to After Visit Summary for other counseling recommendations.   Return in about 4 weeks (around 05/03/2017) for Routine Prenatal Appointment.  Prentice Docker, MD  04/05/2017 12:44 PM

## 2017-04-06 LAB — RPR+RH+ABO+RUB AB+AB SCR+CB...
Antibody Screen: NEGATIVE
HEMATOCRIT: 35.9 % (ref 34.0–46.6)
HEMOGLOBIN: 12.2 g/dL (ref 11.1–15.9)
HIV Screen 4th Generation wRfx: NONREACTIVE
Hepatitis B Surface Ag: NEGATIVE
MCH: 30.6 pg (ref 26.6–33.0)
MCHC: 34 g/dL (ref 31.5–35.7)
MCV: 90 fL (ref 79–97)
Platelets: 243 10*3/uL (ref 150–379)
RBC: 3.99 x10E6/uL (ref 3.77–5.28)
RDW: 13 % (ref 12.3–15.4)
RH TYPE: POSITIVE
RPR Ser Ql: NONREACTIVE
Rubella Antibodies, IGG: <0.9 index — ABNORMAL LOW (ref >0.99)
Varicella zoster IgG: 485 index (ref >165)
WBC: 5.4 10*3/uL (ref 3.4–10.8)

## 2017-04-07 ENCOUNTER — Encounter: Payer: Self-pay | Admitting: Obstetrics and Gynecology

## 2017-04-09 ENCOUNTER — Encounter: Payer: Self-pay | Admitting: Obstetrics and Gynecology

## 2017-04-09 ENCOUNTER — Ambulatory Visit (INDEPENDENT_AMBULATORY_CARE_PROVIDER_SITE_OTHER): Payer: 59 | Admitting: Obstetrics and Gynecology

## 2017-04-09 VITALS — BP 118/70 | Wt 213.0 lb

## 2017-04-09 DIAGNOSIS — O09899 Supervision of other high risk pregnancies, unspecified trimester: Secondary | ICD-10-CM

## 2017-04-09 DIAGNOSIS — Z6835 Body mass index (BMI) 35.0-35.9, adult: Secondary | ICD-10-CM

## 2017-04-09 DIAGNOSIS — O99211 Obesity complicating pregnancy, first trimester: Secondary | ICD-10-CM

## 2017-04-09 DIAGNOSIS — O200 Threatened abortion: Secondary | ICD-10-CM

## 2017-04-09 DIAGNOSIS — O09219 Supervision of pregnancy with history of pre-term labor, unspecified trimester: Secondary | ICD-10-CM

## 2017-04-09 DIAGNOSIS — R8271 Bacteriuria: Secondary | ICD-10-CM

## 2017-04-09 DIAGNOSIS — O0990 Supervision of high risk pregnancy, unspecified, unspecified trimester: Secondary | ICD-10-CM

## 2017-04-09 NOTE — Progress Notes (Signed)
Routine Prenatal Care Visit  Subjective  Laura Wiggins is a 31 y.o. G3P1011 at [redacted]w[redacted]d being seen today for ongoing prenatal care.  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  ----------------------------------------------------------------------------------- Patient reports bleeding.  Scant small amount of bleeding yesterday, thin line on a pad. Small brown discharge today.  Uterine cramping yesterday and today.    .  .   . Denies leaking of fluid.  ----------------------------------------------------------------------------------- The following portions of the patient's history were reviewed and updated as appropriate: allergies, current medications, past family history, past medical history, past social history, past surgical history and problem list. Problem list updated.   Objective  Blood pressure 118/70, weight 213 lb (96.6 kg), last menstrual period 02/16/2017. Pregravid weight 214 lb (97.1 kg) Total Weight Gain  (-0.454 kg) Urinalysis: Urine Protein: Negative Urine Glucose: Negative  Fetal Status:           General:  Alert, oriented and cooperative. Patient is in no acute distress.  Skin: Skin is warm and dry. No rash noted.   Cardiovascular: Normal heart rate noted  Respiratory: Normal respiratory effort, no problems with respiration noted  Abdomen: Soft, gravid, appropriate for gestational age.       Pelvic:  Cervical exam deferred        Extremities: Normal range of motion.     ental Status: Normal mood and affect. Normal behavior. Normal judgment and thought content.     Assessment   31 y.o. G3P1011 at [redacted]w[redacted]d by  11/23/2017, by Last Menstrual Period  presenting for work-in prenatal visit  Plan   pregnancy3 Problems (from 02/16/17 to present)    Problem Noted Resolved   Supervision of high risk pregnancy, antepartum 04/05/2017 by Will Bonnet, MD No   Overview Addendum 04/07/2017  1:38 PM by Will Bonnet, MD    [facility] Prenatal Labs  Dating L=6 Blood type: A/Positive/-- (11/02 1034)   Genetic Screen 1 Screen: [ ]  deciding   AFP:     Quad:     NIPS: Antibody:Negative (11/02 1034)  Anatomic [place]  Rubella: <0.90 (11/02 1034)  Varicella: Immune  GTT Early: []  order at nv              Third trimester:  RPR: Non Reactive (11/02 1034)   Rhogam n/a HBsAg: Negative (11/02 1034)   TDaP vaccine                       Flu Shot: HIV: negative  Baby Food                                GBS:  Pos GBS bacteriuria at [place] -txed. Will need ppx for labor  Contraception  Pap:  CBB     CS/VBAC    Support Person            History of preterm delivery, currently pregnant 04/05/2017 by Will Bonnet, MD No   Overview Signed 04/05/2017 12:39 PM by Will Bonnet, MD    [ ]  Discussed 17 OHPC. Patient deciding      Obesity complicating pregnancy 69/11/7891 by Will Bonnet, MD No   Overview Signed 04/05/2017  12:40 PM by Will Bonnet, MD    [ ]  early 1h gtt - needs to be ordered      BMI 35.0-35.9,adult 04/05/2017 by Will Bonnet, MD No   GBS bacteriuria 04/05/2017 by Will Bonnet, MD No   Overview Addendum 04/05/2017 12:41 PM by Will Bonnet, MD    [ ]  treated with amoxicillin in early pregnancy [ ]  tx for GBS in labor      Depression 12/05/2015 by Coral Spikes, DO No   Overview Signed 04/05/2017 12:40 PM by Will Bonnet, MD    [ ]  Needs baseline EPDS        Bedside US showed fetus with heartbeat.  Reassurance given. Discussed miscarriage precautions. Will order transvaginal US for next week to follow up.  Return in about 1 week (around 04/16/2017) for trans vag [place] in 1 week, ROB 3 weeks.

## 2017-04-15 ENCOUNTER — Ambulatory Visit (INDEPENDENT_AMBULATORY_CARE_PROVIDER_SITE_OTHER): Payer: 59 | Admitting: Advanced Practice Midwife

## 2017-04-15 ENCOUNTER — Ambulatory Visit (INDEPENDENT_AMBULATORY_CARE_PROVIDER_SITE_OTHER): Payer: 59

## 2017-04-15 ENCOUNTER — Other Ambulatory Visit: Payer: Self-pay | Admitting: Obstetrics and Gynecology

## 2017-04-15 ENCOUNTER — Encounter: Payer: Self-pay | Admitting: Advanced Practice Midwife

## 2017-04-15 VITALS — BP 120/74 | Wt 212.0 lb

## 2017-04-15 DIAGNOSIS — O200 Threatened abortion: Secondary | ICD-10-CM | POA: Diagnosis not present

## 2017-04-15 DIAGNOSIS — O0990 Supervision of high risk pregnancy, unspecified, unspecified trimester: Secondary | ICD-10-CM

## 2017-04-15 DIAGNOSIS — Z3A08 8 weeks gestation of pregnancy: Secondary | ICD-10-CM

## 2017-04-15 MED ORDER — CITRANATAL 90 DHA 90-1 & 300 MG PO MISC: 1.0000 | Freq: Every day | ORAL | 11 refills | Status: DC

## 2017-04-15 NOTE — Progress Notes (Signed)
Routine Prenatal Care Visit  Subjective  Laura Wiggins is a 31 y.o. G3P1011 at [redacted]w[redacted]d being seen today for ongoing prenatal care.  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  ----------------------------------------------------------------------------------- Patient reports nausea.  She is requesting medication. She has not had any additional bleeding since the episode last week. She has had some occasional mild cramping. Denies leaking of fluid.  ----------------------------------------------------------------------------------- The following portions of the patient's history were reviewed and updated as appropriate: allergies, current medications, past family history, past medical history, past social history, past surgical history and problem list. Problem list updated.   Objective  Blood pressure 120/74, weight 212 lb (96.2 kg), last menstrual period 02/16/2017. Pregravid weight 214 lb (97.1 kg) Total Weight Gain  (-0.907 kg) Urinalysis: Urine Protein: Negative Urine Glucose: Negative  Fetal Status: FHTs 170s on u/s today Unable to see report at the time of this note  General:  Alert, oriented and cooperative. Patient is in no acute distress.  Skin: Skin is warm and dry. No rash noted.   Cardiovascular: Normal heart rate noted  Respiratory: Normal respiratory effort, no problems with respiration noted  Abdomen: Soft, gravid, appropriate for gestational age. Pain/Pressure: Absent     Pelvic:  Cervical exam deferred        Extremities: Normal range of motion.     Mental Status: Normal mood and affect. Normal behavior. Normal judgment and thought content.   Assessment    31 y.o. G3P1011 at [redacted]w[redacted]d by  11/23/2017, by Last Menstrual Period presenting for routine prenatal visit  Plan   pregnancy3 Problems (from 02/16/17 to present)    Problem Noted Resolved   Supervision of high risk pregnancy, antepartum 04/05/2017 by Will Bonnet, MD No   Overview Addendum 04/07/2017  1:38 PM by Will Bonnet, MD    [facility] Prenatal Labs  Dating L=6 Blood type: A/Positive/-- (11/02 1034)   Genetic Screen 1 Screen: [ ]  deciding   AFP:     Quad:     NIPS: Antibody:Negative (11/02 1034)  Anatomic [place]  Rubella: <0.90 (11/02 1034)  Varicella: Immune  GTT Early: []  order at nv              Third trimester:  RPR: Non Reactive (11/02 1034)   Rhogam n/a HBsAg: Negative (11/02 1034)   TDaP vaccine                       Flu Shot: HIV: negative  Baby Food                                GBS:  Pos GBS bacteriuria at [place] -txed. Will need ppx for labor  Contraception  Pap:  CBB     CS/VBAC    Support Person John           History of preterm delivery, currently pregnant 04/05/2017 by Will Bonnet, MD No   Overview Signed 04/05/2017 12:39 PM by Will Bonnet, MD    [ ]  Discussed 64 OHPC. Patient deciding      Obesity complicating pregnancy 69/11/7891 by Will Bonnet, MD No   Overview Signed 04/05/2017 12:40 PM by Prentice Docker  D, MD    [ ]  early 1h gtt - needs to be ordered      BMI 35.0-35.9,adult 04/05/2017 by Will Bonnet, MD No   GBS bacteriuria 04/05/2017 by Will Bonnet, MD No   Overview Addendum 04/05/2017 12:41 PM by Will Bonnet, MD    [ ]  treated with amoxicillin in early pregnancy [ ]  tx for GBS in labor      Depression 12/05/2015 by Coral Spikes, DO No   Overview Signed 04/05/2017 12:40 PM by Will Bonnet, MD    [ ]  Needs baseline EPDS          Preterm labor symptoms and general obstetric precautions including but not limited to vaginal bleeding, contractions, leaking of fluid and fetal movement were  reviewed in detail with the patient.  Sample of [place] given Rx sent for CitraNatal [place] 90 Return for has next appt already scheduled.  Rod Can, CNM  04/15/2017 4:06 PM

## 2017-04-15 NOTE — Progress Notes (Signed)
U/s today. A lot of nausea.

## 2017-04-18 ENCOUNTER — Telehealth: Payer: Self-pay

## 2017-04-18 NOTE — Telephone Encounter (Signed)
Pt states she was seen on Monday for spotting and had u/s. She started bleeding again last night and it was a little more than spotting and brighter red but not heavy. Pt also started cramping last night and still cramping this morning. Unsure is she needs to be seen or wait until next appt. Please advise. Thank you.

## 2017-04-18 NOTE — Telephone Encounter (Signed)
FMLA/DISABILITY form for [employer] filled out and given to [place] for processing.

## 2017-04-18 NOTE — Telephone Encounter (Signed)
Ultrasound reviewed and normal Monday. Rec appt tomorrow w [place] if cont to bleed. If stops then can wait for next appt.  If quickly worsens today or tonight then consider ER but would prefer to see her here.

## 2017-04-18 NOTE — Telephone Encounter (Signed)
Pt states bleeding is only slight spotting this morning. Last night experienced a total amount of about a tablespoon of bleeding. Pt to call if sxs worsen or persist.

## 2017-04-18 NOTE — Telephone Encounter (Signed)
Left msg for pt to call back

## 2017-05-02 ENCOUNTER — Encounter: Payer: Self-pay | Admitting: Advanced Practice Midwife

## 2017-05-02 ENCOUNTER — Ambulatory Visit (INDEPENDENT_AMBULATORY_CARE_PROVIDER_SITE_OTHER): Payer: 59 | Admitting: Advanced Practice Midwife

## 2017-05-02 VITALS — BP 124/72 | Wt 216.0 lb

## 2017-05-02 DIAGNOSIS — O0990 Supervision of high risk pregnancy, unspecified, unspecified trimester: Secondary | ICD-10-CM

## 2017-05-02 DIAGNOSIS — Z3A10 10 weeks gestation of pregnancy: Secondary | ICD-10-CM

## 2017-05-02 DIAGNOSIS — O219 Vomiting of pregnancy, unspecified: Secondary | ICD-10-CM

## 2017-05-02 MED ORDER — ONDANSETRON 4 MG PO TBDP: 4.0000 mg | ORAL_TABLET | Freq: Four times a day (QID) | ORAL | 2 refills | Status: DC | PRN

## 2017-05-02 NOTE — Progress Notes (Signed)
No vb.no lof. Needs nausea medication

## 2017-05-02 NOTE — Progress Notes (Signed)
Routine Prenatal Care Visit  Subjective  Laura Wiggins is a 31 y.o. G3P1011 at [redacted]w[redacted]d being seen today for ongoing prenatal care.  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  ----------------------------------------------------------------------------------- Patient reports nausea.   Denies contractions.  Vag. Bleeding: None.  Denies leaking of fluid.  ----------------------------------------------------------------------------------- The following portions of the patient's history were reviewed and updated as appropriate: allergies, current medications, past family history, past medical history, past social history, past surgical history and problem list. Problem list updated.   Objective  Blood pressure 124/72, weight 216 lb (98 kg), last menstrual period 02/16/2017. Pregravid weight 214 lb (97.1 kg) Total Weight Gain 2 lb (0.907 kg) Urinalysis: Urine Protein: Negative Urine Glucose: Negative  Fetal Status: positive fetal heart tones  General:  Alert, oriented and cooperative. Patient is in no acute distress.  Skin: Skin is warm and dry. No rash noted.   Cardiovascular: Normal heart rate noted  Respiratory: Normal respiratory effort, no problems with respiration noted  Abdomen: Soft, gravid, appropriate for gestational age. Pain/Pressure: Absent     Pelvic:  Cervical exam deferred        Extremities: Normal range of motion.     Mental Status: Normal mood and affect. Normal behavior. Normal judgment and thought content.   Assessment   31 y.o. G3P1011 at [redacted]w[redacted]d by  11/23/2017, by Last Menstrual Period presenting for routine prenatal visit  Plan   pregnancy3 Problems  (from 02/16/17 to present)    Problem Noted Resolved   Supervision of high risk pregnancy, antepartum 04/05/2017 by Will Bonnet, MD No   Overview Addendum 04/07/2017  1:38 PM by Will Bonnet, MD    [facility] Prenatal Labs  Dating L=6 Blood type: A/Positive/-- (11/02 1034)   Genetic Screen 1 Screen: [ ]  deciding   AFP: Quad: NIPS: Antibody:Negative (11/02 1034)  Anatomic [place]  Rubella: <0.90 (11/02 1034)  Varicella: Immune  GTT Early: []  order at [facility]              Third trimester:  RPR: Non Reactive (11/02 1034)   Rhogam n/a HBsAg: Negative (11/02 1034)   TDaP vaccine                       Flu Shot: HIV: negative  Baby Food                                GBS:  Pos GBS bacteriuria at [place] -txed. Will need ppx for labor  Contraception  Pap:  CBB     CS/VBAC  Hx PTD at 36 wks, 17P start at 16 wks  Support Person John           History of preterm delivery, currently pregnant 04/05/2017 by Will Bonnet, MD No   Overview Signed 04/05/2017 12:39 PM by Will Bonnet, MD    [ ]  Patient would like to have 17P      Obesity complicating pregnancy 76/07/8313 by Will Bonnet, MD No   Overview Signed 04/05/2017 12:40 PM by Will Bonnet, MD    [ ]  early 1h gtt - ordered for 14 wk visit      BMI 35.0-35.9,adult  04/05/2017 by Will Bonnet, MD No   GBS bacteriuria 04/05/2017 by Will Bonnet, MD No   Overview Addendum 04/05/2017 12:41 PM by Will Bonnet, MD    [x]  treated with amoxicillin in early pregnancy [ ]  tx for GBS in labor      Depression 12/05/2015 by Coral Spikes, DO No   Overview Signed 04/05/2017 12:40 PM by Will Bonnet, MD    [ ]  Needs baseline EPDS          Preterm labor symptoms and general obstetric precautions including but not limited to vaginal bleeding, contractions, leaking of fluid and fetal movement were reviewed in detail with the patient. Informaseq today. Patient wants results to be left at front for pick up by  her mother for gender reveal.  Return in about 4 weeks (around 05/30/2017) for rob.  Rod Can, CNM  05/02/2017 2:28 PM

## 2017-05-03 ENCOUNTER — Telehealth: Payer: Self-pay

## 2017-05-03 ENCOUNTER — Encounter: Payer: Self-pay | Admitting: Obstetrics and Gynecology

## 2017-05-03 NOTE — Telephone Encounter (Signed)
FMLA/DISABILITY additional for for [employer] filled out and given to [place] for processing.

## 2017-05-08 ENCOUNTER — Other Ambulatory Visit: Payer: Self-pay | Admitting: Obstetrics & Gynecology

## 2017-05-08 MED ORDER — BUPROPION HCL ER (XL) 300 MG PO TB24: 300.0000 mg | ORAL_TABLET | Freq: Every day | ORAL | 3 refills | Status: DC

## 2017-05-08 MED ORDER — BUPROPION HCL ER (XL) 300 MG PO TB24: 300.0000 mg | ORAL_TABLET | Freq: Every day | ORAL | 1 refills | Status: DC

## 2017-05-08 NOTE — Telephone Encounter (Signed)
ERx done for [facility] (#7) and [facility] (#90)

## 2017-05-10 LAB — INFORMASEQ(SM) WITH XY ANALYSIS
FETAL FRACTION (%): 6.3
Fetal Number: 1
GESTATIONAL AGE AT COLLECTION: 10.5 wk
WEIGHT: 216 [lb_av]

## 2017-05-15 ENCOUNTER — Encounter: Payer: Self-pay | Admitting: Obstetrics & Gynecology

## 2017-05-15 NOTE — Telephone Encounter (Signed)
Can you go over results since [facility] is not here, or let me know and ill tell the pt

## 2017-05-31 ENCOUNTER — Encounter: Payer: 59 | Admitting: Obstetrics and Gynecology

## 2017-05-31 ENCOUNTER — Encounter: Payer: Self-pay | Admitting: Obstetrics and Gynecology

## 2017-05-31 ENCOUNTER — Ambulatory Visit (INDEPENDENT_AMBULATORY_CARE_PROVIDER_SITE_OTHER): Payer: 59 | Admitting: Obstetrics and Gynecology

## 2017-05-31 ENCOUNTER — Other Ambulatory Visit: Payer: 59

## 2017-05-31 VITALS — BP 126/80 | Wt 218.0 lb

## 2017-05-31 DIAGNOSIS — F32A Depression, unspecified: Secondary | ICD-10-CM

## 2017-05-31 DIAGNOSIS — O09219 Supervision of pregnancy with history of pre-term labor, unspecified trimester: Secondary | ICD-10-CM

## 2017-05-31 DIAGNOSIS — O0990 Supervision of high risk pregnancy, unspecified, unspecified trimester: Secondary | ICD-10-CM

## 2017-05-31 DIAGNOSIS — O99212 Obesity complicating pregnancy, second trimester: Secondary | ICD-10-CM

## 2017-05-31 DIAGNOSIS — Z6835 Body mass index (BMI) 35.0-35.9, adult: Secondary | ICD-10-CM

## 2017-05-31 DIAGNOSIS — F329 Major depressive disorder, single episode, unspecified: Secondary | ICD-10-CM

## 2017-05-31 DIAGNOSIS — O09899 Supervision of other high risk pregnancies, unspecified trimester: Secondary | ICD-10-CM

## 2017-05-31 DIAGNOSIS — Z3A14 14 weeks gestation of pregnancy: Secondary | ICD-10-CM

## 2017-05-31 DIAGNOSIS — R8271 Bacteriuria: Secondary | ICD-10-CM

## 2017-05-31 MED ORDER — HYDROXYPROGESTERONE CAPROATE 250 MG/ML IM OIL: 250.0000 mg | TOPICAL_OIL | INTRAMUSCULAR | 3 refills | Status: DC

## 2017-05-31 NOTE — Progress Notes (Signed)
Routine Prenatal Care Visit  Subjective  Laura Wiggins is a 31 y.o. G3P1011 at [redacted]w[redacted]d being seen today for ongoing prenatal care.  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  ----------------------------------------------------------------------------------- Patient reports no complaints.    . Vag. Bleeding: None.   . Denies leaking of fluid.  1h gtt today.  ----------------------------------------------------------------------------------- The following portions of the patient's history were reviewed and updated as appropriate: allergies, current medications, past family history, past medical history, past social history, past surgical history and problem list. Problem list updated.  Objective  Blood pressure 126/80, weight 218 lb (98.9 kg), last menstrual period 02/16/2017. Pregravid weight 214 lb (97.1 kg) Total Weight Gain 4 lb (1.814 kg) Urinalysis: Urine Protein: Negative Urine Glucose: Negative  Fetal Status: Fetal Heart Rate (bpm): 152         General:  Alert, oriented and cooperative. Patient is in no acute distress.  Skin: Skin is warm and dry. No rash noted.   Cardiovascular: Normal heart rate noted  Respiratory: Normal respiratory effort, no problems with respiration noted  Abdomen: Soft, gravid, appropriate for gestational age. Pain/Pressure: Absent     Pelvic:  Cervical exam deferred        Extremities: Normal range of motion.     Mental Status: Normal mood and affect. Normal behavior. Normal judgment and thought content.   Assessment   31 y.o. G3P1011 at [redacted]w[redacted]d by  11/23/2017, by Last Menstrual Period presenting for routine prenatal visit  Plan    pregnancy3 Problems (from 02/16/17 to present)    Problem Noted Resolved   Supervision of high risk pregnancy, antepartum 04/05/2017 by Will Bonnet, MD No   Overview Addendum 04/07/2017  1:38 PM by Will Bonnet, MD    [facility] Prenatal Labs  Dating L=6 Blood type: A/Positive/-- (11/02 1034)   Genetic Screen 1 Screen: [ ]  deciding   AFP:     Quad:     NIPS: Antibody:Negative (11/02 1034)  Anatomic [place]  Rubella: <0.90 (11/02 1034)  Varicella: Immune  GTT Early: []  order at [facility]              Third trimester:  RPR: Non Reactive (11/02 1034)   Rhogam n/a HBsAg: Negative (11/02 1034)   TDaP vaccine                       Flu Shot: HIV: negative  Baby Food                                GBS:  Pos GBS bacteriuria at [place] -txed. Will need ppx for labor  Contraception  Pap:  CBB     CS/VBAC    Support Person            History of preterm delivery, currently pregnant 04/05/2017 by Will Bonnet, MD No   Overview Signed 04/05/2017 12:39 PM by Will Bonnet, MD    [ ]  17 OHPC. - ordered today      Obesity complicating pregnancy 38/12/5641 by Will Bonnet, MD No   Overview Signed 04/05/2017 12:40 PM by Will Bonnet, MD    [x]  early 1h gtt - today  BMI 35.0-35.9,adult 04/05/2017 by Will Bonnet, MD No   GBS bacteriuria 04/05/2017 by Will Bonnet, MD No   Overview Addendum 04/05/2017 12:41 PM by Will Bonnet, MD    [x]  treated with amoxicillin in early pregnancy [ ]  tx for GBS in labor      Depression 12/05/2015 by Coral Spikes, DO No   Overview Signed 04/05/2017 12:40 PM by Will Bonnet, MD    [ ]  Needs baseline EPDS         Preterm labor symptoms and general obstetric precautions including but not limited to vaginal bleeding, contractions, leaking of fluid and fetal movement were reviewed in detail with the patient. Please refer to After Visit Summary for other counseling recommendations.   Return in about 4 weeks (around  06/28/2017) for schedule anatomy u/s and routine prenatal, have patient return for injection appt 2 weeks (makena).   -1 h gtt today - ordered 17 OHPC today. F/u 2 weeks for 1st injection - anatomy u/s 4 weeks  Prentice Docker, MD  05/31/2017 3:17 PM

## 2017-06-01 ENCOUNTER — Other Ambulatory Visit: Payer: Self-pay | Admitting: Advanced Practice Midwife

## 2017-06-01 DIAGNOSIS — R7309 Other abnormal glucose: Secondary | ICD-10-CM

## 2017-06-01 LAB — GLUCOSE, 1 HOUR GESTATIONAL: Gestational Diabetes Screen: 147 mg/dL — ABNORMAL HIGH (ref 65–139)

## 2017-06-07 ENCOUNTER — Other Ambulatory Visit: Payer: BLUE CROSS/BLUE SHIELD

## 2017-06-07 DIAGNOSIS — R7309 Other abnormal glucose: Secondary | ICD-10-CM

## 2017-06-08 LAB — GESTATIONAL GLUCOSE TOLERANCE
Glucose, Fasting: 84 mg/dL (ref 65–94)
Glucose, GTT - 1 Hour: 118 mg/dL (ref 65–179)
Glucose, GTT - 2 Hour: 107 mg/dL (ref 65–154)
Glucose, GTT - 3 Hour: 65 mg/dL (ref 65–139)

## 2017-06-14 ENCOUNTER — Ambulatory Visit (INDEPENDENT_AMBULATORY_CARE_PROVIDER_SITE_OTHER): Payer: BLUE CROSS/BLUE SHIELD

## 2017-06-14 DIAGNOSIS — O09899 Supervision of other high risk pregnancies, unspecified trimester: Secondary | ICD-10-CM

## 2017-06-14 DIAGNOSIS — O09219 Supervision of pregnancy with history of pre-term labor, unspecified trimester: Secondary | ICD-10-CM | POA: Diagnosis not present

## 2017-06-14 MED ORDER — HYDROXYPROGESTERONE CAPROATE 250 MG/ML IM OIL
250.0000 mg | TOPICAL_OIL | Freq: Once | INTRAMUSCULAR | Status: AC
  Administered 2017-06-14: 250 mg via INTRAMUSCULAR

## 2017-06-21 ENCOUNTER — Ambulatory Visit (INDEPENDENT_AMBULATORY_CARE_PROVIDER_SITE_OTHER): Payer: BLUE CROSS/BLUE SHIELD

## 2017-06-21 DIAGNOSIS — O09892 Supervision of other high risk pregnancies, second trimester: Secondary | ICD-10-CM

## 2017-06-21 DIAGNOSIS — Z3A17 17 weeks gestation of pregnancy: Secondary | ICD-10-CM | POA: Diagnosis not present

## 2017-06-21 DIAGNOSIS — O09212 Supervision of pregnancy with history of pre-term labor, second trimester: Secondary | ICD-10-CM

## 2017-06-21 MED ORDER — HYDROXYPROGESTERONE CAPROATE 250 MG/ML IM OIL
250.0000 mg | TOPICAL_OIL | Freq: Once | INTRAMUSCULAR | Status: AC
  Administered 2017-06-21: 250 mg via INTRAMUSCULAR

## 2017-06-28 ENCOUNTER — Ambulatory Visit (INDEPENDENT_AMBULATORY_CARE_PROVIDER_SITE_OTHER): Payer: BLUE CROSS/BLUE SHIELD

## 2017-06-28 ENCOUNTER — Encounter: Payer: Self-pay | Admitting: Obstetrics and Gynecology

## 2017-06-28 ENCOUNTER — Ambulatory Visit (INDEPENDENT_AMBULATORY_CARE_PROVIDER_SITE_OTHER): Payer: BLUE CROSS/BLUE SHIELD | Admitting: Obstetrics and Gynecology

## 2017-06-28 VITALS — BP 120/68 | Wt 222.0 lb

## 2017-06-28 DIAGNOSIS — Z362 Encounter for other antenatal screening follow-up: Secondary | ICD-10-CM

## 2017-06-28 DIAGNOSIS — F32A Depression, unspecified: Secondary | ICD-10-CM

## 2017-06-28 DIAGNOSIS — Z6835 Body mass index (BMI) 35.0-35.9, adult: Secondary | ICD-10-CM

## 2017-06-28 DIAGNOSIS — O0990 Supervision of high risk pregnancy, unspecified, unspecified trimester: Secondary | ICD-10-CM | POA: Diagnosis not present

## 2017-06-28 DIAGNOSIS — Z3A18 18 weeks gestation of pregnancy: Secondary | ICD-10-CM

## 2017-06-28 DIAGNOSIS — O09899 Supervision of other high risk pregnancies, unspecified trimester: Secondary | ICD-10-CM

## 2017-06-28 DIAGNOSIS — O99212 Obesity complicating pregnancy, second trimester: Secondary | ICD-10-CM

## 2017-06-28 DIAGNOSIS — O09219 Supervision of pregnancy with history of pre-term labor, unspecified trimester: Secondary | ICD-10-CM

## 2017-06-28 DIAGNOSIS — R8271 Bacteriuria: Secondary | ICD-10-CM

## 2017-06-28 DIAGNOSIS — F329 Major depressive disorder, single episode, unspecified: Secondary | ICD-10-CM

## 2017-06-28 MED ORDER — HYDROXYPROGESTERONE CAPROATE 250 MG/ML IM OIL
250.0000 mg | TOPICAL_OIL | Freq: Once | INTRAMUSCULAR | Status: AC
  Administered 2017-06-28: 250 mg via INTRAMUSCULAR

## 2017-06-28 NOTE — Progress Notes (Signed)
Routine Prenatal Care Visit  Subjective  Laura Wiggins is a 32 y.o. G3P1011 at [redacted]w[redacted]d being seen today for ongoing prenatal care.  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  ----------------------------------------------------------------------------------- Patient reports no complaints.    . Vag. Bleeding: None.  Movement: Present. Denies leaking of fluid.  ----------------------------------------------------------------------------------- The following portions of the patient's history were reviewed and updated as appropriate: allergies, current medications, past family history, past medical history, past social history, past surgical history and problem list. Problem list updated.   Objective  Blood pressure 120/68, weight 222 lb (100.7 kg), last menstrual period 02/16/2017. Pregravid weight 214 lb (97.1 kg) Total Weight Gain 8 lb (3.629 kg) Urinalysis:      Fetal Status: Fetal Heart Rate (bpm): present   Movement: Present     General:  Alert, oriented and cooperative. Patient is in no acute distress.  Skin: Skin is warm and dry. No rash noted.   Cardiovascular: Normal heart rate noted  Respiratory: Normal respiratory effort, no problems with respiration noted  Abdomen: Soft, gravid, appropriate for gestational age. Pain/Pressure: Absent     Pelvic:  Cervical exam deferred        Extremities: Normal range of motion.     Mental Status: Normal mood and affect. Normal behavior. Normal judgment and thought content.   Assessment   32 y.o. G3P1011 at [redacted]w[redacted]d by  11/23/2017, by Last Menstrual Period presenting for routine prenatal visit  Plan   pregnancy3 Problems (from  02/16/17 to present)    Problem Noted Resolved   Supervision of high risk pregnancy, antepartum 04/05/2017 by Will Bonnet, MD No   Overview Addendum 05/31/2017  3:19 PM by Will Bonnet, MD    [facility] Prenatal Labs  Dating L=6 Blood type: A/Positive/-- (11/02 1034)   Genetic Screen NIPS: diploid nml XX Antibody:Negative (11/02 1034)  Anatomic [place] Incomplete today, f/u nv 2wks Rubella: <0.90 (11/02 1034)  Varicella: Immune  GTT Early: 147, 3h all nml        Third trimester:  RPR: Non Reactive (11/02 1034)   Rhogam n/a HBsAg: Negative (11/02 1034)   TDaP vaccine                       Flu Shot: HIV: negative  Baby Food                                GBS:  Pos GBS bacteriuria at [place] -txed. Will need ppx for labor  Contraception  Pap:  CBB     CS/VBAC    Support Person            History of preterm delivery, currently pregnant 04/05/2017 by Will Bonnet, MD No   Overview Addendum 05/31/2017  3:19 PM by Will Bonnet, MD    [x]  17 OHPC. - ordered 12/28     Obesity complicating pregnancy 56/09/3327 by Will Bonnet, MD No   Overview Signed 04/05/2017 12:40 PM by Will Bonnet, MD    [x]  early 1h gtt - needs to be ordered     BMI 35.0-35.9,adult 04/05/2017 by Will Bonnet, MD No   GBS bacteriuria 04/05/2017 by Glennon Mac,  Estill Bamberg, MD No   Overview Addendum 04/05/2017 12:41 PM by Will Bonnet, MD    - treated with amoxicillin in early pregnancy [ ]  tx for GBS in labor     Depression 12/05/2015 by Coral Spikes, DO No   Overview Signed 04/05/2017 12:40 PM by Will Bonnet, MD    [ ]  Needs baseline EPDS         Preterm labor symptoms and general obstetric precautions including but not limited to vaginal bleeding, contractions, leaking of fluid and fetal movement were reviewed in detail with the patient. Please refer to After Visit Summary for other counseling recommendations.   Return in about 2 weeks (around 07/12/2017) for schedule  anatomy completion u/s and Routine Prenatal Appointment.  Prentice Docker, MD  06/28/2017 5:11 PM

## 2017-07-05 ENCOUNTER — Ambulatory Visit (INDEPENDENT_AMBULATORY_CARE_PROVIDER_SITE_OTHER): Payer: BLUE CROSS/BLUE SHIELD

## 2017-07-05 DIAGNOSIS — O09892 Supervision of other high risk pregnancies, second trimester: Secondary | ICD-10-CM

## 2017-07-05 DIAGNOSIS — O09212 Supervision of pregnancy with history of pre-term labor, second trimester: Secondary | ICD-10-CM | POA: Diagnosis not present

## 2017-07-05 MED ORDER — HYDROXYPROGESTERONE CAPROATE 250 MG/ML IM OIL
250.0000 mg | TOPICAL_OIL | Freq: Once | INTRAMUSCULAR | Status: AC
  Administered 2017-07-05: 250 mg via INTRAMUSCULAR

## 2017-07-05 NOTE — Progress Notes (Signed)
Pt here for hydroxyprogesterone inj which was given IM left glut.  NDC# [phone]

## 2017-07-08 ENCOUNTER — Telehealth: Payer: Self-pay

## 2017-07-08 NOTE — Telephone Encounter (Signed)
Refills called in for hydroxyprogesterone coproate 250mg /ml, 16 total, will send four at a time.  They will call to arrange delivery after clears ins.

## 2017-07-12 ENCOUNTER — Ambulatory Visit (INDEPENDENT_AMBULATORY_CARE_PROVIDER_SITE_OTHER): Payer: BLUE CROSS/BLUE SHIELD | Admitting: Obstetrics & Gynecology

## 2017-07-12 ENCOUNTER — Ambulatory Visit (INDEPENDENT_AMBULATORY_CARE_PROVIDER_SITE_OTHER): Payer: BLUE CROSS/BLUE SHIELD

## 2017-07-12 VITALS — BP 120/70 | Wt 223.0 lb

## 2017-07-12 DIAGNOSIS — O09219 Supervision of pregnancy with history of pre-term labor, unspecified trimester: Secondary | ICD-10-CM

## 2017-07-12 DIAGNOSIS — Z3A20 20 weeks gestation of pregnancy: Secondary | ICD-10-CM

## 2017-07-12 DIAGNOSIS — O09899 Supervision of other high risk pregnancies, unspecified trimester: Secondary | ICD-10-CM

## 2017-07-12 DIAGNOSIS — O99212 Obesity complicating pregnancy, second trimester: Secondary | ICD-10-CM

## 2017-07-12 DIAGNOSIS — O0990 Supervision of high risk pregnancy, unspecified, unspecified trimester: Secondary | ICD-10-CM | POA: Diagnosis not present

## 2017-07-12 MED ORDER — BUTALBITAL-APAP-CAFFEINE 50-325-40 MG PO CAPS: 1.0000 | ORAL_CAPSULE | Freq: Four times a day (QID) | ORAL | 3 refills | Status: DC | PRN

## 2017-07-12 MED ORDER — HYDROXYPROGESTERONE CAPROATE 250 MG/ML IM OIL
250.0000 mg | TOPICAL_OIL | Freq: Once | INTRAMUSCULAR | Status: AC
  Administered 2017-07-12: 250 mg via INTRAMUSCULAR

## 2017-07-12 NOTE — Progress Notes (Signed)
Review of ULTRASOUND.    I have personally reviewed images and report of recent ultrasound done at [facility].    Plan of management to be discussed with patient. PNV Makena weekly for PTL prevention  Barnett Applebaum, MD, [name], [facility] 07/12/2017  5:10 PM

## 2017-07-19 ENCOUNTER — Ambulatory Visit (INDEPENDENT_AMBULATORY_CARE_PROVIDER_SITE_OTHER): Payer: BLUE CROSS/BLUE SHIELD

## 2017-07-19 ENCOUNTER — Telehealth: Payer: Self-pay

## 2017-07-19 DIAGNOSIS — O09212 Supervision of pregnancy with history of pre-term labor, second trimester: Secondary | ICD-10-CM | POA: Diagnosis not present

## 2017-07-19 MED ORDER — HYDROXYPROGESTERONE CAPROATE 250 MG/ML IM OIL
250.0000 mg | TOPICAL_OIL | Freq: Once | INTRAMUSCULAR | Status: AC
  Administered 2017-07-19: 250 mg via INTRAMUSCULAR

## 2017-07-19 NOTE — Telephone Encounter (Signed)
Pt called after hour nurse 6:43pm 07/18/17 stating that [facility] did not have the injection when she went to p/u and that they need dr's order.  After hour nurse spoke c AMS who said to keep appt as we usually have extra to give.  [phone]  I called [facility], rx should be here Tues 07/23/17. Left msg for pt to keep appt this pm.

## 2017-07-19 NOTE — Progress Notes (Signed)
Pt here for hydroxyprogesterone 250mg  inj which was given IM left glut.  NDC# [phone]

## 2017-07-26 ENCOUNTER — Ambulatory Visit (INDEPENDENT_AMBULATORY_CARE_PROVIDER_SITE_OTHER): Payer: BLUE CROSS/BLUE SHIELD | Admitting: Obstetrics & Gynecology

## 2017-07-26 VITALS — BP 110/70 | Wt 228.0 lb

## 2017-07-26 DIAGNOSIS — Z3A22 22 weeks gestation of pregnancy: Secondary | ICD-10-CM | POA: Diagnosis not present

## 2017-07-26 DIAGNOSIS — O09899 Supervision of other high risk pregnancies, unspecified trimester: Secondary | ICD-10-CM

## 2017-07-26 DIAGNOSIS — O0990 Supervision of high risk pregnancy, unspecified, unspecified trimester: Secondary | ICD-10-CM

## 2017-07-26 DIAGNOSIS — O09219 Supervision of pregnancy with history of pre-term labor, unspecified trimester: Secondary | ICD-10-CM

## 2017-07-26 DIAGNOSIS — O99212 Obesity complicating pregnancy, second trimester: Secondary | ICD-10-CM

## 2017-07-26 MED ORDER — HYDROXYPROGESTERONE CAPROATE 250 MG/ML IM OIL
250.0000 mg | TOPICAL_OIL | Freq: Once | INTRAMUSCULAR | Status: AC
  Administered 2017-07-26: 250 mg via INTRAMUSCULAR

## 2017-07-26 NOTE — Patient Instructions (Signed)

## 2017-07-26 NOTE — Progress Notes (Signed)
  Subjective  Fetal Movement? yes Contractions? no Leaking Fluid? no Vaginal Bleeding? No Some crampiness at times  Objective  BP 110/70   Wt 228 lb (103.4 kg)   LMP 02/16/2017   BMI 37.94 kg/m  General: NAD Pumonary: no increased work of breathing Abdomen: gravid, non-tender Extremities: no edema Psychiatric: mood appropriate, affect full  Assessment  32 y.o. G3P1011 at [redacted]w[redacted]d by  11/23/2017, by Last Menstrual Period presenting for routine prenatal visit  Plan   Problem List Items Addressed This Visit      Other   Supervision of high risk pregnancy, antepartum   History of preterm delivery, currently pregnant   Obesity complicating pregnancy    Other Visit Diagnoses    [redacted] weeks gestation of pregnancy    -  Primary    Weekly 17P [place] 2 weeks f/u.  Barnett Applebaum, MD, [name], [facility] 07/26/2017  5:11 PM

## 2017-08-02 ENCOUNTER — Ambulatory Visit (INDEPENDENT_AMBULATORY_CARE_PROVIDER_SITE_OTHER): Payer: BLUE CROSS/BLUE SHIELD

## 2017-08-02 DIAGNOSIS — O09212 Supervision of pregnancy with history of pre-term labor, second trimester: Secondary | ICD-10-CM | POA: Diagnosis not present

## 2017-08-02 DIAGNOSIS — O09892 Supervision of other high risk pregnancies, second trimester: Secondary | ICD-10-CM

## 2017-08-02 MED ORDER — HYDROXYPROGESTERONE CAPROATE 250 MG/ML IM OIL
250.0000 mg | TOPICAL_OIL | Freq: Once | INTRAMUSCULAR | Status: AC
  Administered 2017-08-02: 250 mg via INTRAMUSCULAR

## 2017-08-02 NOTE — Progress Notes (Signed)
Pt here for hydroxyprogesterone inj which was given IM left glut.  NDC# [phone]

## 2017-08-09 ENCOUNTER — Ambulatory Visit (INDEPENDENT_AMBULATORY_CARE_PROVIDER_SITE_OTHER): Payer: BLUE CROSS/BLUE SHIELD

## 2017-08-09 ENCOUNTER — Ambulatory Visit (INDEPENDENT_AMBULATORY_CARE_PROVIDER_SITE_OTHER): Payer: BLUE CROSS/BLUE SHIELD | Admitting: Obstetrics & Gynecology

## 2017-08-09 VITALS — BP 100/70 | Wt 230.0 lb

## 2017-08-09 DIAGNOSIS — Z3A20 20 weeks gestation of pregnancy: Secondary | ICD-10-CM

## 2017-08-09 DIAGNOSIS — Z3A24 24 weeks gestation of pregnancy: Secondary | ICD-10-CM

## 2017-08-09 DIAGNOSIS — O09899 Supervision of other high risk pregnancies, unspecified trimester: Secondary | ICD-10-CM

## 2017-08-09 DIAGNOSIS — O99212 Obesity complicating pregnancy, second trimester: Secondary | ICD-10-CM

## 2017-08-09 DIAGNOSIS — O0990 Supervision of high risk pregnancy, unspecified, unspecified trimester: Secondary | ICD-10-CM

## 2017-08-09 DIAGNOSIS — O09219 Supervision of pregnancy with history of pre-term labor, unspecified trimester: Secondary | ICD-10-CM

## 2017-08-09 MED ORDER — HYDROXYPROGESTERONE CAPROATE 250 MG/ML IM OIL
250.0000 mg | TOPICAL_OIL | Freq: Once | INTRAMUSCULAR | Status: AC
  Administered 2017-08-09: 250 mg via INTRAMUSCULAR

## 2017-08-09 NOTE — Progress Notes (Signed)
  Subjective  Fetal Movement? yes Contractions? no Leaking Fluid? no Vaginal Bleeding? no Crampy abd pains occas. Objective  BP 100/70   Wt 230 lb (104.3 kg)   LMP 02/16/2017   BMI 38.27 kg/m  General: NAD Pumonary: no increased work of breathing Abdomen: gravid, non-tender Extremities: no edema Psychiatric: mood appropriate, affect full  Assessment  32 y.o. G3P1011 at [redacted]w[redacted]d by  11/23/2017, by Last Menstrual Period presenting for routine prenatal visit  Plan   Problem List Items Addressed This Visit      Other   Supervision of high risk pregnancy, antepartum   Relevant Orders   28 Week RH+Panel   History of preterm delivery, currently pregnant   Obesity complicating pregnancy   Relevant Orders   28 Week RH+Panel    Other Visit Diagnoses    [redacted] weeks gestation of pregnancy    -  Primary    Weekly Makena Plan am Glucola April 5 (last one was abnormal, feels it may have been bc it was late in day; normal 3 hour then)  Barnett Applebaum, MD, [place] 08/09/2017  4:52 PM

## 2017-08-14 ENCOUNTER — Other Ambulatory Visit: Payer: Self-pay | Admitting: Family Medicine

## 2017-08-16 ENCOUNTER — Ambulatory Visit: Payer: BLUE CROSS/BLUE SHIELD

## 2017-08-16 DIAGNOSIS — Z8751 Personal history of pre-term labor: Secondary | ICD-10-CM

## 2017-08-16 MED ORDER — HYDROXYPROGESTERONE CAPROATE 275 MG/1.1ML ~~LOC~~ SOAJ
275.0000 mg | Freq: Once | SUBCUTANEOUS | Status: AC
  Administered 2017-08-16: 275 mg via SUBCUTANEOUS

## 2017-08-23 ENCOUNTER — Ambulatory Visit (INDEPENDENT_AMBULATORY_CARE_PROVIDER_SITE_OTHER): Payer: BLUE CROSS/BLUE SHIELD | Admitting: Obstetrics and Gynecology

## 2017-08-23 ENCOUNTER — Encounter: Payer: Self-pay | Admitting: Obstetrics and Gynecology

## 2017-08-23 VITALS — BP 118/74 | Wt 230.0 lb

## 2017-08-23 DIAGNOSIS — Z3A26 26 weeks gestation of pregnancy: Secondary | ICD-10-CM | POA: Diagnosis not present

## 2017-08-23 DIAGNOSIS — Z113 Encounter for screening for infections with a predominantly sexual mode of transmission: Secondary | ICD-10-CM | POA: Diagnosis not present

## 2017-08-23 DIAGNOSIS — R8271 Bacteriuria: Secondary | ICD-10-CM | POA: Diagnosis not present

## 2017-08-23 DIAGNOSIS — Z6835 Body mass index (BMI) 35.0-35.9, adult: Secondary | ICD-10-CM

## 2017-08-23 DIAGNOSIS — O09219 Supervision of pregnancy with history of pre-term labor, unspecified trimester: Secondary | ICD-10-CM | POA: Diagnosis not present

## 2017-08-23 DIAGNOSIS — Z131 Encounter for screening for diabetes mellitus: Secondary | ICD-10-CM

## 2017-08-23 DIAGNOSIS — O0990 Supervision of high risk pregnancy, unspecified, unspecified trimester: Secondary | ICD-10-CM

## 2017-08-23 DIAGNOSIS — O99212 Obesity complicating pregnancy, second trimester: Secondary | ICD-10-CM | POA: Diagnosis not present

## 2017-08-23 DIAGNOSIS — O09899 Supervision of other high risk pregnancies, unspecified trimester: Secondary | ICD-10-CM

## 2017-08-23 NOTE — Progress Notes (Incomplete)
Routine Prenatal Care Visit  Subjective  Laura Wiggins is a 32 y.o. G3P1011 at [redacted]w[redacted]d being seen today for ongoing prenatal care.  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  ----------------------------------------------------------------------------------- Patient reports {sx:14538}.   Contractions: Not present. Vag. Bleeding: None.  Movement: Present. Denies leaking of fluid.  ----------------------------------------------------------------------------------- The following portions of the patient's history were reviewed and updated as appropriate: allergies, current medications, past family history, past medical history, past social history, past surgical history and problem list. Problem list updated.   Objective  Blood pressure 118/74, weight 230 lb (104.3 kg), last menstrual period 02/16/2017. Pregravid weight 214 lb (97.1 kg) Total Weight Gain 16 lb (7.258 kg) Urinalysis: Urine Protein: Negative Urine Glucose: Negative  Fetal Status: Fetal Heart Rate (bpm): 145 Fundal Height: 28 cm Movement: Present     General:  Alert, oriented and cooperative. Patient is in no acute distress.  Skin: Skin is warm and dry. No rash noted.   Cardiovascular: Normal heart rate noted  Respiratory: Normal respiratory effort, no problems with respiration noted  Abdomen: Soft, gravid, appropriate for gestational age. Pain/Pressure: Present     Pelvic:  {Blank single:19197::"Cervical exam performed","Cervical exam deferred"}        Extremities: Normal range of motion.     Mental Status: Normal mood and affect. Normal behavior. Normal judgment and thought  content.   Assessment   32 y.o. G3P1011 at [redacted]w[redacted]d by  11/23/2017, by Last Menstrual Period presenting for {Blank single:19197::"routine","work-in"} prenatal visit  Plan   pregnancy3 Problems (from 02/16/17 to present)    Problem Noted Resolved   Supervision of high risk pregnancy, antepartum 04/05/2017 by Will Bonnet, MD No   Overview Addendum 06/28/2017  5:14 PM by Will Bonnet, MD    [facility] Prenatal Labs  Dating L=6 Blood type: A/Positive/-- (11/02 1034)   Genetic Screen NIPS: diploid nml XX Antibody:Negative (11/02 1034)  Anatomic [place] Incomplete @ 18wks, [ ] f/u ord'd 1/25 Rubella: <0.90 (11/02 1034)  Varicella: Immune  GTT Early:147, 3h all nml        Third trimester: [] needs 3hr RPR: Non Reactive (11/02 1034)   Rhogam n/a HBsAg: Negative (11/02 1034)   TDaP vaccine                       Flu Shot: HIV: negative  Baby Food                                GBS:  Pos GBS bacteriuria at [place] -txed. Will need ppx for labor  Contraception  Pap:  CBB     CS/VBAC    Support Person            History of preterm delivery, currently pregnant 04/05/2017 by Will Bonnet, MD No   Overview Addendum 05/31/2017  3:19 PM by Will Bonnet, MD    [x]  17 OHPC. - ordered 12/28      Obesity complicating pregnancy 87/10/6431 by Will Bonnet, MD No   Overview Addendum 06/28/2017  5:12 PM by Will Bonnet, MD    [x]  early 1h gtt - 91, passed early 3h gtt all values  BMI 35.0-35.9,adult 04/05/2017 by Will Bonnet, MD No   GBS bacteriuria 04/05/2017 by Will Bonnet, MD No   Overview Addendum 04/05/2017 12:41 PM by Will Bonnet, MD    [ ]  treated with amoxicillin in early pregnancy [ ]  tx for GBS in labor      Depression 12/05/2015 by Coral Spikes, DO No   Overview Signed 04/05/2017 12:40 PM by Will Bonnet, MD    [ ]  Needs baseline EPDS          {Blank single:19197::"Term","Preterm"} labor symptoms and general obstetric  precautions including but not limited to vaginal bleeding, contractions, leaking of fluid and fetal movement were reviewed in detail with the patient. Please refer to After Visit Summary for other counseling recommendations.   Return in about 2 weeks (around 09/06/2017) for schedule 3 hour gtt and 28 wk labs with Routine Prenatal Appointment (may make a few 2wk rob appts).  Prentice Docker, MD, [name], [facility] 08/23/2017 4:34 PM

## 2017-08-23 NOTE — Progress Notes (Signed)
Routine Prenatal Care Visit  Subjective  Laura Wiggins is a 31 y.o. G3P1011 at [redacted]w[redacted]d being seen today for ongoing prenatal care.  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  ----------------------------------------------------------------------------------- Patient reports backache.  Mid back, no contractions, but has no fevers, chills, urinary or vaginal symptoms. Contractions: Not present. Vag. Bleeding: None.  Movement: Present. Denies leaking of fluid.  ----------------------------------------------------------------------------------- The following portions of the patient's history were reviewed and updated as appropriate: allergies, current medications, past family history, past medical history, past social history, past surgical history and problem list. Problem list updated.   Objective  Blood pressure 118/74, weight 230 lb (104.3 kg), last menstrual period 02/16/2017. Pregravid weight 214 lb (97.1 kg) Total Weight Gain 16 lb (7.258 kg) Urinalysis: Urine Protein: Negative Urine Glucose: Negative  Fetal Status: Fetal Heart Rate (bpm): 145 Fundal Height: 28 cm Movement: Present     General:  Alert, oriented and cooperative. Patient is in no acute distress.  Skin: Skin is warm and dry. No rash noted.   Cardiovascular: Normal heart rate noted  Respiratory: Normal respiratory effort, no problems with respiration noted  Abdomen: Soft, gravid, appropriate for gestational age. Pain/Pressure: Present   No CVAT  Pelvic:  Cervical exam deferred        Extremities: Normal range of motion.     Mental Status: Normal mood and affect. Normal behavior. Normal judgment and thought  content.   Assessment   32 y.o. G3P1011 at [redacted]w[redacted]d by  11/23/2017, by Last Menstrual Period presenting for routine prenatal visit  Plan   pregnancy3 Problems (from 02/16/17 to present)    Problem Noted Resolved   Supervision of high risk pregnancy, antepartum 04/05/2017 by Will Bonnet, MD No   Overview Addendum 06/28/2017  5:14 PM by Will Bonnet, MD    [facility] Prenatal Labs  Dating L=6 Blood type: A/Positive/-- (11/02 1034)   Genetic Screen NIPS: diploid nml XX Antibody:Negative (11/02 1034)  Anatomic [place] Incomplete @ 18wks, [ ] f/u ord'd 1/25 Rubella: <0.90 (11/02 1034)  Varicella: Immune  GTT Early:147, 3h all nml        Third trimester: [] needs 3hr RPR: Non Reactive (11/02 1034)   Rhogam n/a HBsAg: Negative (11/02 1034)   TDaP vaccine                       Flu Shot: HIV: negative  Baby Food                                GBS:  Pos GBS bacteriuria at [place] -txed. Will need ppx for labor  Contraception  Pap:  CBB     CS/VBAC    Support Person            History of preterm delivery, currently pregnant 04/05/2017 by Will Bonnet, MD No   Overview Addendum 05/31/2017  3:19 PM by Will Bonnet, MD    [x]  17 OHPC. - ordered 12/28      Obesity complicating pregnancy 56/07/1306 by Will Bonnet, MD No   Overview Addendum 06/28/2017  5:12 PM by Will Bonnet, MD    [x]  early 1h gtt - 147, passed early  3h gtt all values      BMI 35.0-35.9,adult 04/05/2017 by Will Bonnet, MD No   GBS bacteriuria 04/05/2017 by Will Bonnet, MD No   Overview Addendum 04/05/2017 12:41 PM by Will Bonnet, MD    [ ]  treated with amoxicillin in early pregnancy [ ]  tx for GBS in labor      Depression 12/05/2015 by Coral Spikes, DO No   Overview Signed 04/05/2017 12:40 PM by Will Bonnet, MD    [ ]  Needs baseline EPDS          Preterm labor symptoms and general obstetric precautions including but not limited to vaginal bleeding, contractions,  leaking of fluid and fetal movement were reviewed in detail with the patient. Please refer to After Visit Summary for other counseling recommendations.   -17OHP today and weekly until 36 weeks -3h gtt in 2 weeks (failed 1h early in pregnancy and passed 3h gtt) with 28 week labs. Already scheduled.  Return in about 2 weeks (around 09/06/2017) for schedule 3 hour gtt and 28 wk labs with Routine Prenatal Appointment (may make a few 2wk rob appts).  Prentice Docker, MD, [name], [facility] 08/23/2017 4:34 PM

## 2017-08-26 MED ORDER — HYDROXYPROGESTERONE CAPROATE 250 MG/ML IM OIL
250.0000 mg | TOPICAL_OIL | Freq: Once | INTRAMUSCULAR | Status: AC
  Administered 2017-08-23: 250 mg via INTRAMUSCULAR

## 2017-08-26 NOTE — Addendum Note (Signed)
Addended by: Brien Few on: 08/26/2017 09:56 AM   Modules accepted: Orders

## 2017-08-30 ENCOUNTER — Ambulatory Visit (INDEPENDENT_AMBULATORY_CARE_PROVIDER_SITE_OTHER): Payer: BLUE CROSS/BLUE SHIELD

## 2017-08-30 DIAGNOSIS — O0990 Supervision of high risk pregnancy, unspecified, unspecified trimester: Secondary | ICD-10-CM | POA: Diagnosis not present

## 2017-08-30 MED ORDER — HYDROXYPROGESTERONE CAPROATE 250 MG/ML IM OIL
250.0000 mg | TOPICAL_OIL | Freq: Once | INTRAMUSCULAR | Status: AC
  Administered 2017-08-30: 250 mg via INTRAMUSCULAR

## 2017-09-06 ENCOUNTER — Ambulatory Visit (INDEPENDENT_AMBULATORY_CARE_PROVIDER_SITE_OTHER): Payer: BLUE CROSS/BLUE SHIELD | Admitting: Obstetrics & Gynecology

## 2017-09-06 ENCOUNTER — Other Ambulatory Visit: Payer: BLUE CROSS/BLUE SHIELD

## 2017-09-06 VITALS — BP 120/80 | Wt 232.0 lb

## 2017-09-06 DIAGNOSIS — O09219 Supervision of pregnancy with history of pre-term labor, unspecified trimester: Secondary | ICD-10-CM | POA: Diagnosis not present

## 2017-09-06 DIAGNOSIS — O0990 Supervision of high risk pregnancy, unspecified, unspecified trimester: Secondary | ICD-10-CM

## 2017-09-06 DIAGNOSIS — O99213 Obesity complicating pregnancy, third trimester: Secondary | ICD-10-CM

## 2017-09-06 DIAGNOSIS — Z113 Encounter for screening for infections with a predominantly sexual mode of transmission: Secondary | ICD-10-CM

## 2017-09-06 DIAGNOSIS — Z8751 Personal history of pre-term labor: Secondary | ICD-10-CM

## 2017-09-06 DIAGNOSIS — O0993 Supervision of high risk pregnancy, unspecified, third trimester: Secondary | ICD-10-CM

## 2017-09-06 DIAGNOSIS — Z3A28 28 weeks gestation of pregnancy: Secondary | ICD-10-CM

## 2017-09-06 DIAGNOSIS — Z131 Encounter for screening for diabetes mellitus: Secondary | ICD-10-CM

## 2017-09-06 DIAGNOSIS — O09899 Supervision of other high risk pregnancies, unspecified trimester: Secondary | ICD-10-CM

## 2017-09-06 MED ORDER — HYDROXYPROGESTERONE CAPROATE 250 MG/ML IM OIL
250.0000 mg | TOPICAL_OIL | Freq: Once | INTRAMUSCULAR | Status: AC
  Administered 2017-09-06: 250 mg via INTRAMUSCULAR

## 2017-09-06 NOTE — Patient Instructions (Signed)
Third Trimester of Pregnancy The third trimester is from week 28 through week 40 (months 7 through 9). The third trimester is a time when the unborn baby (fetus) is growing rapidly. At the end of the ninth month, the fetus is about 20 inches in length and weighs 6-10 pounds. Body changes during your third trimester Your body will continue to go through many changes during pregnancy. The changes vary from woman to woman. During the third trimester:  Your weight will continue to increase. You can expect to gain 25-35 pounds (11-16 kg) by the end of the pregnancy.  You may begin to get stretch marks on your hips, abdomen, and breasts.  You may urinate more often because the fetus is moving lower into your pelvis and pressing on your bladder.  You may develop or continue to have heartburn. This is caused by increased hormones that slow down muscles in the digestive tract.  You may develop or continue to have constipation because increased hormones slow digestion and cause the muscles that push waste through your intestines to relax.  You may develop hemorrhoids. These are swollen veins (varicose veins) in the rectum that can itch or be painful.  You may develop swollen, bulging veins (varicose veins) in your legs.  You may have increased body aches in the pelvis, back, or thighs. This is due to weight gain and increased hormones that are relaxing your joints.  You may have changes in your hair. These can include thickening of your hair, rapid growth, and changes in texture. Some women also have hair loss during or after pregnancy, or hair that feels dry or thin. Your hair will most likely return to normal after your baby is born.  Your breasts will continue to grow and they will continue to become tender. A yellow fluid (colostrum) may leak from your breasts. This is the first milk you are producing for your baby.  Your belly button may stick out.  You may notice more swelling in your hands,  face, or ankles.  You may have increased tingling or numbness in your hands, arms, and legs. The skin on your belly may also feel numb.  You may feel short of breath because of your expanding uterus.  You may have more problems sleeping. This can be caused by the size of your belly, increased need to urinate, and an increase in your body's metabolism.  You may notice the fetus "dropping," or moving lower in your abdomen (lightening).  You may have increased vaginal discharge.  You may notice your joints feel loose and you may have pain around your pelvic bone.  What to expect at prenatal visits You will have prenatal exams every 2 weeks until week 36. Then you will have weekly prenatal exams. During a routine prenatal visit:  You will be weighed to make sure you and the baby are growing normally.  Your blood pressure will be taken.  Your abdomen will be measured to track your baby's growth.  The fetal heartbeat will be listened to.  Any test results from the previous visit will be discussed.  You may have a cervical check near your due date to see if your cervix has softened or thinned (effaced).  You will be tested for Group B streptococcus. This happens between 35 and 37 weeks.  Your health care provider may ask you:  What your birth plan is.  How you are feeling.  If you are feeling the baby move.  If you have had   any abnormal symptoms, such as leaking fluid, bleeding, severe headaches, or abdominal cramping.  If you are using any tobacco products, including cigarettes, chewing tobacco, and electronic cigarettes.  If you have any questions.  Other tests or screenings that may be performed during your third trimester include:  Blood tests that check for low iron levels (anemia).  Fetal testing to check the health, activity level, and growth of the fetus. Testing is done if you have certain medical conditions or if there are problems during the  pregnancy.  Nonstress test (NST). This test checks the health of your baby to make sure there are no signs of problems, such as the baby not getting enough oxygen. During this test, a belt is placed around your belly. The baby is made to move, and its heart rate is monitored during movement.  What is false labor? False labor is a condition in which you feel small, irregular tightenings of the muscles in the womb (contractions) that usually go away with rest, changing position, or drinking water. These are called Braxton Hicks contractions. Contractions may last for hours, days, or even weeks before true labor sets in. If contractions come at regular intervals, become more frequent, increase in intensity, or become painful, you should see your health care provider. What are the signs of labor?  Abdominal cramps.  Regular contractions that start at 10 minutes apart and become stronger and more frequent with time.  Contractions that start on the top of the uterus and spread down to the lower abdomen and back.  Increased pelvic pressure and dull back pain.  A watery or bloody mucus discharge that comes from the vagina.  Leaking of amniotic fluid. This is also known as your "water breaking." It could be a slow trickle or a gush. Let your health care provider know if it has a color or strange odor. If you have any of these signs, call your health care provider right away, even if it is before your due date. Follow these instructions at home: Medicines  Follow your health care provider's instructions regarding medicine use. Specific medicines may be either safe or unsafe to take during pregnancy.  Take a prenatal vitamin that contains at least 600 micrograms (mcg) of folic acid.  If you develop constipation, try taking a stool softener if your health care provider approves. Eating and drinking  Eat a balanced diet that includes fresh fruits and vegetables, whole grains, good sources of protein  such as meat, eggs, or tofu, and low-fat dairy. Your health care provider will help you determine the amount of weight gain that is right for you.  Avoid raw meat and uncooked cheese. These carry germs that can cause birth defects in the baby.  If you have low calcium intake from food, talk to your health care provider about whether you should take a daily calcium supplement.  Eat four or five small meals rather than three large meals a day.  Limit foods that are high in fat and processed sugars, such as fried and sweet foods.  To prevent constipation: ? Drink enough fluid to keep your urine clear or pale yellow. ? Eat foods that are high in fiber, such as fresh fruits and vegetables, whole grains, and beans. Activity  Exercise only as directed by your health care provider. Most women can continue their usual exercise routine during pregnancy. Try to exercise for 30 minutes at least 5 days a week. Stop exercising if you experience uterine contractions.  Avoid heavy   lifting.  Do not exercise in extreme heat or humidity, or at high altitudes.  Wear low-heel, comfortable shoes.  Practice good posture.  You may continue to have sex unless your health care provider tells you otherwise. Relieving pain and discomfort  Take frequent breaks and rest with your legs elevated if you have leg cramps or low back pain.  Take warm sitz baths to soothe any pain or discomfort caused by hemorrhoids. Use hemorrhoid cream if your health care provider approves.  Wear a good support bra to prevent discomfort from breast tenderness.  If you develop varicose veins: ? Wear support pantyhose or compression stockings as told by your healthcare provider. ? Elevate your feet for 15 minutes, 3-4 times a day. Prenatal care  Write down your questions. Take them to your prenatal visits.  Keep all your prenatal visits as told by your health care provider. This is important. Safety  Wear your seat belt at  all times when driving.  Make a list of emergency phone numbers, including numbers for family, friends, the hospital, and police and fire departments. General instructions  Avoid cat litter boxes and soil used by cats. These carry germs that can cause birth defects in the baby. If you have a cat, ask someone to clean the litter box for you.  Do not travel far distances unless it is absolutely necessary and only with the approval of your health care provider.  Do not use hot tubs, steam rooms, or saunas.  Do not drink alcohol.  Do not use any products that contain nicotine or tobacco, such as cigarettes and e-cigarettes. If you need help quitting, ask your health care provider.  Do not use any medicinal herbs or unprescribed drugs. These chemicals affect the formation and growth of the baby.  Do not douche or use tampons or scented sanitary pads.  Do not cross your legs for long periods of time.  To prepare for the arrival of your baby: ? Take prenatal classes to understand, practice, and ask questions about labor and delivery. ? Make a trial run to the hospital. ? Visit the hospital and tour the maternity area. ? Arrange for maternity or paternity leave through employers. ? Arrange for family and friends to take care of pets while you are in the hospital. ? Purchase a rear-facing car seat and make sure you know how to install it in your car. ? Pack your hospital bag. ? Prepare the baby's nursery. Make sure to remove all pillows and stuffed animals from the baby's crib to prevent suffocation.  Visit your dentist if you have not gone during your pregnancy. Use a soft toothbrush to brush your teeth and be gentle when you floss. Contact a health care provider if:  You are unsure if you are in labor or if your water has broken.  You become dizzy.  You have mild pelvic cramps, pelvic pressure, or nagging pain in your abdominal area.  You have lower back pain.  You have persistent  nausea, vomiting, or diarrhea.  You have an unusual or bad smelling vaginal discharge.  You have pain when you urinate. Get help right away if:  Your water breaks before 37 weeks.  You have regular contractions less than 5 minutes apart before 37 weeks.  You have a fever.  You are leaking fluid from your vagina.  You have spotting or bleeding from your vagina.  You have severe abdominal pain or cramping.  You have rapid weight loss or weight gain.    You have shortness of breath with chest pain.  You notice sudden or extreme swelling of your face, hands, ankles, feet, or legs.  Your baby makes fewer than 10 movements in 2 hours.  You have severe headaches that do not go away when you take medicine.  You have vision changes. Summary  The third trimester is from week 28 through week 40, months 7 through 9. The third trimester is a time when the unborn baby (fetus) is growing rapidly.  During the third trimester, your discomfort may increase as you and your baby continue to gain weight. You may have abdominal, leg, and back pain, sleeping problems, and an increased need to urinate.  During the third trimester your breasts will keep growing and they will continue to become tender. A yellow fluid (colostrum) may leak from your breasts. This is the first milk you are producing for your baby.  False labor is a condition in which you feel small, irregular tightenings of the muscles in the womb (contractions) that eventually go away. These are called Braxton Hicks contractions. Contractions may last for hours, days, or even weeks before true labor sets in.  Signs of labor can include: abdominal cramps; regular contractions that start at 10 minutes apart and become stronger and more frequent with time; watery or bloody mucus discharge that comes from the vagina; increased pelvic pressure and dull back pain; and leaking of amniotic fluid. This information is not intended to replace advice  given to you by your health care provider. Make sure you discuss any questions you have with your health care provider. Document Released: 05/15/2001 Document Revised: 10/27/2015 Document Reviewed: 07/22/2012 Elsevier Interactive Patient Education  2017 [employer]  

## 2017-09-06 NOTE — Progress Notes (Signed)
  Subjective  Fetal Movement? yes Contractions? no Leaking Fluid? no Vaginal Bleeding? no No s/sx PTL  Objective  BP 120/80   Wt 232 lb (105.2 kg)   LMP 02/16/2017   BMI 38.61 kg/m  General: NAD Pumonary: no increased work of breathing Abdomen: gravid, non-tender Extremities: no edema Psychiatric: mood appropriate, affect full  Assessment  32 y.o. G3P1011 at [redacted]w[redacted]d by  11/23/2017, by Last Menstrual Period presenting for routine prenatal visit  Plan   Problem List Items Addressed This Visit      Other   Supervision of high risk pregnancy, antepartum   History of preterm delivery, currently pregnant   Obesity complicating pregnancy    Other Visit Diagnoses    History of preterm labor    -  Primary   [redacted] weeks gestation of pregnancy        3 hour today Makena weekly  Barnett Applebaum, MD, [name], [facility] 09/06/2017  9:37 AM

## 2017-09-07 LAB — GESTATIONAL GLUCOSE TOLERANCE
GLUCOSE 3 HOUR GTT: 56 mg/dL — AB (ref 65–139)
GLUCOSE FASTING: 83 mg/dL (ref 65–94)
Glucose, GTT - 1 Hour: 164 mg/dL (ref 65–179)
Glucose, GTT - 2 Hour: 159 mg/dL — ABNORMAL HIGH (ref 65–154)

## 2017-09-13 ENCOUNTER — Ambulatory Visit (INDEPENDENT_AMBULATORY_CARE_PROVIDER_SITE_OTHER): Payer: BLUE CROSS/BLUE SHIELD

## 2017-09-13 DIAGNOSIS — O09899 Supervision of other high risk pregnancies, unspecified trimester: Secondary | ICD-10-CM

## 2017-09-13 DIAGNOSIS — O09219 Supervision of pregnancy with history of pre-term labor, unspecified trimester: Secondary | ICD-10-CM | POA: Diagnosis not present

## 2017-09-13 MED ORDER — HYDROXYPROGESTERONE CAPROATE 250 MG/ML IM OIL
250.0000 mg | TOPICAL_OIL | Freq: Once | INTRAMUSCULAR | Status: AC
  Administered 2017-09-13: 250 mg via INTRAMUSCULAR

## 2017-09-19 ENCOUNTER — Encounter: Payer: Self-pay | Admitting: Obstetrics and Gynecology

## 2017-09-19 ENCOUNTER — Ambulatory Visit (INDEPENDENT_AMBULATORY_CARE_PROVIDER_SITE_OTHER): Payer: BLUE CROSS/BLUE SHIELD | Admitting: Obstetrics and Gynecology

## 2017-09-19 VITALS — BP 124/74 | Wt 236.0 lb

## 2017-09-19 DIAGNOSIS — Z23 Encounter for immunization: Secondary | ICD-10-CM

## 2017-09-19 DIAGNOSIS — O0990 Supervision of high risk pregnancy, unspecified, unspecified trimester: Secondary | ICD-10-CM

## 2017-09-19 DIAGNOSIS — O09219 Supervision of pregnancy with history of pre-term labor, unspecified trimester: Secondary | ICD-10-CM | POA: Diagnosis not present

## 2017-09-19 DIAGNOSIS — F32A Depression, unspecified: Secondary | ICD-10-CM

## 2017-09-19 DIAGNOSIS — O09899 Supervision of other high risk pregnancies, unspecified trimester: Secondary | ICD-10-CM

## 2017-09-19 DIAGNOSIS — Z6835 Body mass index (BMI) 35.0-35.9, adult: Secondary | ICD-10-CM

## 2017-09-19 DIAGNOSIS — R8271 Bacteriuria: Secondary | ICD-10-CM

## 2017-09-19 DIAGNOSIS — O99213 Obesity complicating pregnancy, third trimester: Secondary | ICD-10-CM

## 2017-09-19 DIAGNOSIS — F329 Major depressive disorder, single episode, unspecified: Secondary | ICD-10-CM

## 2017-09-19 DIAGNOSIS — Z3A30 30 weeks gestation of pregnancy: Secondary | ICD-10-CM

## 2017-09-19 MED ORDER — TETANUS-DIPHTH-ACELL PERTUSSIS 5-2.5-18.5 LF-MCG/0.5 IM SUSP
0.5000 mL | Freq: Once | INTRAMUSCULAR | Status: AC
  Administered 2017-09-19: 0.5 mL via INTRAMUSCULAR

## 2017-09-19 MED ORDER — HYDROXYPROGESTERONE CAPROATE 250 MG/ML IM OIL
250.0000 mg | TOPICAL_OIL | Freq: Once | INTRAMUSCULAR | Status: AC
  Administered 2017-09-19: 250 mg via INTRAMUSCULAR

## 2017-09-19 NOTE — Progress Notes (Signed)
Routine Prenatal Care Visit  Subjective  Laura Wiggins is a 32 y.o. G3P1011 at [redacted]w[redacted]d being seen today for ongoing prenatal care.  She is currently monitored for the following issues for this high-risk pregnancy and has Obesity; Depression; Erythromelalgia ([place]); Lower extremity edema; Chest pain; Supervision of high risk pregnancy, antepartum; History of preterm delivery, currently pregnant; Obesity complicating pregnancy; BMI 35.0-35.9,adult; and GBS bacteriuria on their problem list.  ----------------------------------------------------------------------------------- Patient reports no complaints.   Contractions: Not present. Vag. Bleeding: None.  Movement: Present. Denies leaking of fluid.  ----------------------------------------------------------------------------------- The following portions of the patient's history were reviewed and updated as appropriate: allergies, current medications, past family history, past medical history, past social history, past surgical history and problem list. Problem list updated.   Objective  Blood pressure 124/74, weight 236 lb (107 kg), last menstrual period 02/16/2017. Pregravid weight 214 lb (97.1 kg) Total Weight Gain 22 lb (9.979 kg) Urinalysis: Urine Protein: Negative Urine Glucose: Negative  Fetal Status: Fetal Heart Rate (bpm): 145 Fundal Height: 30 cm Movement: Present     General:  Alert, oriented and cooperative. Patient is in no acute distress.  Skin: Skin is warm and dry. No rash noted.   Cardiovascular: Normal heart rate noted  Respiratory: Normal respiratory effort, no problems with respiration noted  Abdomen: Soft, gravid, appropriate for gestational age. Pain/Pressure: Absent     Pelvic:  Cervical exam deferred        Extremities: Normal range of motion.     Mental Status: Normal mood and affect. Normal behavior. Normal judgment and thought content.   Assessment   32 y.o. G3P1011 at [redacted]w[redacted]d by  11/23/2017, by Last Menstrual  Period presenting for routine prenatal visit  Plan   pregnancy3 Problems (from 02/16/17 to present)    Problem Noted Resolved   Supervision of high risk pregnancy, antepartum 04/05/2017 by Will Bonnet, MD No   Overview Addendum 06/28/2017  5:14 PM by Will Bonnet, MD    [facility]inic [facility] Prenatal Labs  Dating L=6 Blood type: A/Positive/-- (11/02 1034)   Genetic Screen NIPS: diploid nml XX Antibody:Negative (11/02 1034)  Anatomic [place] Incomplete @ 18wks, [ ] f/u ord'd 1/25 Rubella: <0.90 (11/02 1034)  Varicella: Immune  GTT Early:147, 3h all nml        Third trimester: [] needs 3hr RPR: Non Reactive (11/02 1034)   Rhogam n/a HBsAg: Negative (11/02 1034)   TDaP vaccine                       Flu Shot: HIV: negative  Baby Food                                GBS:  Pos GBS bacteriuria at [place] -txed. Will need ppx for labor  Contraception  Pap:  CBB     CS/VBAC    Support Person            History of preterm delivery, currently pregnant 04/05/2017 by Will Bonnet, MD No   Overview Addendum 05/31/2017  3:19 PM by Will Bonnet, MD    [x]  17 [facility]. - ordered 12/28      Obesity complicating pregnancy 88/10/275 by Will Bonnet, MD No   Overview Addendum 06/28/2017  5:12 PM by Will Bonnet, MD    [x]  early 1h gtt - 147, passed early 3h gtt all values      BMI 35.0-35.9,adult 04/05/2017  by Will Bonnet, MD No   GBS bacteriuria 04/05/2017 by Will Bonnet, MD No   Overview Addendum 04/05/2017 12:41 PM by Will Bonnet, MD    [ ]  treated with amoxicillin in early pregnancy [ ]  tx for GBS in labor      Depression 12/05/2015 by Coral Spikes, DO No   Overview Signed 04/05/2017 12:40 PM by Will Bonnet, MD    [ ]  Needs baseline EPDS          Preterm labor symptoms and general obstetric precautions including but not limited to vaginal bleeding, contractions, leaking of fluid and fetal movement were reviewed in detail with the patient. Please  refer to After Visit Summary for other counseling recommendations.   -TDaP today - Blood transfusion consent signed today - [facility] today and weekly  Return in about 2 weeks (around 10/03/2017) for Routine Prenatal Appointment.  Prentice Docker, MD, [name], [facility] 09/19/2017 5:26 PM

## 2017-09-19 NOTE — Patient Instructions (Signed)

## 2017-09-19 NOTE — Progress Notes (Incomplete)
Routine Prenatal Care Visit  Subjective  Laura Wiggins is a 32 y.o. G3P1011 at [redacted]w[redacted]d being seen today for ongoing prenatal care.  She is currently monitored for the following issues for this {Blank single:19197::"high-risk","low-risk"} pregnancy and has Obesity; Depression; Erythromelalgia ([place]); Lower extremity edema; Chest pain; Supervision of high risk pregnancy, antepartum; History of preterm delivery, currently pregnant; Obesity complicating pregnancy; BMI 35.0-35.9,adult; and GBS bacteriuria on their problem list.  ----------------------------------------------------------------------------------- Patient reports {sx:14538}.   Contractions: Not present. Vag. Bleeding: None.  Movement: Present. Denies leaking of fluid.  ----------------------------------------------------------------------------------- The following portions of the patient's history were reviewed and updated as appropriate: allergies, current medications, past family history, past medical history, past social history, past surgical history and problem list. Problem list updated.   Objective  Blood pressure 124/74, weight 236 lb (107 kg), last menstrual period 02/16/2017. Pregravid weight 214 lb (97.1 kg) Total Weight Gain 22 lb (9.979 kg) Urinalysis: Urine Protein: Negative Urine Glucose: Negative  Fetal Status: Fetal Heart Rate (bpm): 145 Fundal Height: 30 cm Movement: Present     General:  Alert, oriented and cooperative. Patient is in no acute distress.  Skin: Skin is warm and dry. No rash noted.   Cardiovascular: Normal heart rate noted  Respiratory: Normal respiratory effort, no problems with respiration noted  Abdomen: Soft, gravid, appropriate for gestational age. Pain/Pressure: Absent     Pelvic:  {Blank single:19197::"Cervical exam performed","Cervical exam deferred"}        Extremities: Normal range of motion.     Mental Status: Normal mood and affect. Normal behavior. Normal judgment and thought content.    Assessment   32 y.o. G3P1011 at [redacted]w[redacted]d by  11/23/2017, by Last Menstrual Period presenting for {Blank single:19197::"routine","work-in"} prenatal visit  Plan   pregnancy3 Problems (from 02/16/17 to present)    Problem Noted Resolved   Supervision of high risk pregnancy, antepartum 04/05/2017 by Will Bonnet, MD No   Overview Addendum 06/28/2017  5:14 PM by Will Bonnet, MD    [facility]inic [facility] Prenatal Labs  Dating L=6 Blood type: A/Positive/-- (11/02 1034)   Genetic Screen NIPS: diploid nml XX Antibody:Negative (11/02 1034)  Anatomic [place] Incomplete @ 18wks, [ ] f/u ord'd 1/25 Rubella: <0.90 (11/02 1034)  Varicella: Immune  GTT Early:147, 3h all nml        Third trimester: [] needs 3hr RPR: Non Reactive (11/02 1034)   Rhogam n/a HBsAg: Negative (11/02 1034)   TDaP vaccine                       Flu Shot: HIV: negative  Baby Food                                GBS:  Pos GBS bacteriuria at [place] -txed. Will need ppx for labor  Contraception  Pap:  CBB     CS/VBAC    Support Person            History of preterm delivery, currently pregnant 04/05/2017 by Will Bonnet, MD No   Overview Addendum 05/31/2017  3:19 PM by Will Bonnet, MD    [x]  17 [facility]. - ordered 12/28      Obesity complicating pregnancy 07/11/7410 by Will Bonnet, MD No   Overview Addendum 06/28/2017  5:12 PM by Will Bonnet, MD    [x]  early 1h gtt - 68, passed early 3h gtt all values  BMI 35.0-35.9,adult 04/05/2017 by Will Bonnet, MD No   GBS bacteriuria 04/05/2017 by Will Bonnet, MD No   Overview Addendum 04/05/2017 12:41 PM by Will Bonnet, MD    [ ]  treated with amoxicillin in early pregnancy [ ]  tx for GBS in labor      Depression 12/05/2015 by Coral Spikes, DO No   Overview Signed 04/05/2017 12:40 PM by Will Bonnet, MD    [ ]  Needs baseline EPDS          {Blank single:19197::"Term","Preterm"} labor symptoms and general obstetric precautions  including but not limited to vaginal bleeding, contractions, leaking of fluid and fetal movement were reviewed in detail with the patient. Please refer to After Visit Summary for other counseling recommendations.   Return in about 2 weeks (around 10/03/2017) for Routine Prenatal Appointment.  Prentice Docker, MD, [name], [facility] 09/19/2017 5:26 PM

## 2017-09-19 NOTE — Addendum Note (Signed)
Addended by: Cleophas Dunker D on: 09/19/2017 05:54 PM   Modules accepted: Orders

## 2017-09-27 ENCOUNTER — Ambulatory Visit (INDEPENDENT_AMBULATORY_CARE_PROVIDER_SITE_OTHER): Payer: BLUE CROSS/BLUE SHIELD

## 2017-09-27 DIAGNOSIS — O09899 Supervision of other high risk pregnancies, unspecified trimester: Secondary | ICD-10-CM

## 2017-09-27 DIAGNOSIS — O09219 Supervision of pregnancy with history of pre-term labor, unspecified trimester: Secondary | ICD-10-CM

## 2017-09-27 MED ORDER — HYDROXYPROGESTERONE CAPROATE 250 MG/ML IM OIL
250.0000 mg | TOPICAL_OIL | Freq: Once | INTRAMUSCULAR | Status: AC
  Administered 2017-09-27: 250 mg via INTRAMUSCULAR

## 2017-10-04 ENCOUNTER — Ambulatory Visit (INDEPENDENT_AMBULATORY_CARE_PROVIDER_SITE_OTHER): Payer: BLUE CROSS/BLUE SHIELD | Admitting: Obstetrics & Gynecology

## 2017-10-04 VITALS — BP 120/80 | Wt 240.0 lb

## 2017-10-04 DIAGNOSIS — Z8751 Personal history of pre-term labor: Secondary | ICD-10-CM | POA: Diagnosis not present

## 2017-10-04 DIAGNOSIS — O0990 Supervision of high risk pregnancy, unspecified, unspecified trimester: Secondary | ICD-10-CM

## 2017-10-04 MED ORDER — HYDROXYPROGESTERONE CAPROATE 250 MG/ML IM OIL
250.0000 mg | TOPICAL_OIL | Freq: Once | INTRAMUSCULAR | Status: AC
  Administered 2017-10-04: 250 mg via INTRAMUSCULAR

## 2017-10-04 NOTE — Patient Instructions (Signed)
Braxton Hicks Contractions °Contractions of the uterus can occur throughout pregnancy, but they are not always a sign that you are in labor. You may have practice contractions called Braxton Hicks contractions. These false labor contractions are sometimes confused with true labor. °What are Braxton Hicks contractions? °Braxton Hicks contractions are tightening movements that occur in the muscles of the uterus before labor. Unlike true labor contractions, these contractions do not result in opening (dilation) and thinning of the cervix. Toward the end of pregnancy (32-34 weeks), Braxton Hicks contractions can happen more often and may become stronger. These contractions are sometimes difficult to tell apart from true labor because they can be very uncomfortable. You should not feel embarrassed if you go to the hospital with false labor. °Sometimes, the only way to tell if you are in true labor is for your health care provider to look for changes in the cervix. The health care provider will do a physical exam and may monitor your contractions. If you are not in true labor, the exam should show that your cervix is not dilating and your water has not broken. °If there are other health problems associated with your pregnancy, it is completely safe for you to be sent home with false labor. You may continue to have Braxton Hicks contractions until you go into true labor. °How to tell the difference between true labor and false labor °True labor °· Contractions last 30-70 seconds. °· Contractions become very regular. °· Discomfort is usually felt in the top of the uterus, and it spreads to the lower abdomen and low back. °· Contractions do not go away with walking. °· Contractions usually become more intense and increase in frequency. °· The cervix dilates and gets thinner. °False labor °· Contractions are usually shorter and not as strong as true labor contractions. °· Contractions are usually irregular. °· Contractions  are often felt in the front of the lower abdomen and in the groin. °· Contractions may go away when you walk around or change positions while lying down. °· Contractions get weaker and are shorter-lasting as time goes on. °· The cervix usually does not dilate or become thin. °Follow these instructions at home: °· Take over-the-counter and prescription medicines only as told by your health care provider. °· Keep up with your usual exercises and follow other instructions from your health care provider. °· Eat and drink lightly if you think you are going into labor. °· If Braxton Hicks contractions are making you uncomfortable: °? Change your position from lying down or resting to walking, or change from walking to resting. °? Sit and rest in a tub of warm water. °? Drink enough fluid to keep your urine pale yellow. Dehydration may cause these contractions. °? Do slow and deep breathing several times an hour. °· Keep all follow-up prenatal visits as told by your health care provider. This is important. °Contact a health care provider if: °· You have a fever. °· You have continuous pain in your abdomen. °Get help right away if: °· Your contractions become stronger, more regular, and closer together. °· You have fluid leaking or gushing from your vagina. °· You pass blood-tinged mucus (bloody show). °· You have bleeding from your vagina. °· You have low back pain that you never had before. °· You feel your baby’s head pushing down and causing pelvic pressure. °· Your baby is not moving inside you as much as it used to. °Summary °· Contractions that occur before labor are called Braxton   Hicks contractions, false labor, or practice contractions. °· Braxton Hicks contractions are usually shorter, weaker, farther apart, and less regular than true labor contractions. True labor contractions usually become progressively stronger and regular and they become more frequent. °· Manage discomfort from Braxton Hicks contractions by  changing position, resting in a warm bath, drinking plenty of water, or practicing deep breathing. °This information is not intended to replace advice given to you by your health care provider. Make sure you discuss any questions you have with your health care provider. °Document Released: 10/04/2016 Document Revised: 10/04/2016 Document Reviewed: 10/04/2016 °Elsevier Interactive Patient Education © 2018 [employer] ° °

## 2017-10-04 NOTE — Progress Notes (Signed)
  Subjective  Fetal Movement? yes Contractions? no Leaking Fluid? no Vaginal Bleeding? no  Objective  BP 120/80   Wt 240 lb (108.9 kg)   LMP 02/16/2017   BMI 39.94 kg/m  General: NAD Pumonary: no increased work of breathing Abdomen: gravid, non-tender Extremities: no edema Psychiatric: mood appropriate, affect full  Assessment  32 y.o. G3P1011 at [redacted]w[redacted]d by  11/23/2017, by Last Menstrual Period presenting for routine prenatal visit  Plan   Problem List Items Addressed This Visit      Other   Supervision of high risk pregnancy, antepartum    Other Visit Diagnoses    History of preterm labor    -  Primary   32 week prematurity        17 P today  Barnett Applebaum, MD, [name], [facility] 10/04/2017  4:41 PM

## 2017-10-11 ENCOUNTER — Ambulatory Visit (INDEPENDENT_AMBULATORY_CARE_PROVIDER_SITE_OTHER): Payer: BLUE CROSS/BLUE SHIELD

## 2017-10-11 DIAGNOSIS — Z8751 Personal history of pre-term labor: Secondary | ICD-10-CM

## 2017-10-11 MED ORDER — HYDROXYPROGESTERONE CAPROATE 250 MG/ML IM OIL
250.0000 mg | TOPICAL_OIL | Freq: Once | INTRAMUSCULAR | Status: AC
  Administered 2017-10-11: 250 mg via INTRAMUSCULAR

## 2017-10-13 ENCOUNTER — Encounter: Payer: Self-pay | Admitting: Obstetrics & Gynecology

## 2017-10-14 NOTE — Telephone Encounter (Signed)
Please advise 

## 2017-10-18 ENCOUNTER — Ambulatory Visit (INDEPENDENT_AMBULATORY_CARE_PROVIDER_SITE_OTHER): Payer: BLUE CROSS/BLUE SHIELD | Admitting: Obstetrics and Gynecology

## 2017-10-18 ENCOUNTER — Encounter: Payer: Self-pay | Admitting: Obstetrics and Gynecology

## 2017-10-18 VITALS — BP 110/70 | Wt 242.0 lb

## 2017-10-18 DIAGNOSIS — Z6835 Body mass index (BMI) 35.0-35.9, adult: Secondary | ICD-10-CM

## 2017-10-18 DIAGNOSIS — R8271 Bacteriuria: Secondary | ICD-10-CM

## 2017-10-18 DIAGNOSIS — O09893 Supervision of other high risk pregnancies, third trimester: Secondary | ICD-10-CM

## 2017-10-18 DIAGNOSIS — O09213 Supervision of pregnancy with history of pre-term labor, third trimester: Secondary | ICD-10-CM

## 2017-10-18 DIAGNOSIS — F32A Depression, unspecified: Secondary | ICD-10-CM

## 2017-10-18 DIAGNOSIS — F329 Major depressive disorder, single episode, unspecified: Secondary | ICD-10-CM

## 2017-10-18 DIAGNOSIS — O99213 Obesity complicating pregnancy, third trimester: Secondary | ICD-10-CM

## 2017-10-18 DIAGNOSIS — O09899 Supervision of other high risk pregnancies, unspecified trimester: Secondary | ICD-10-CM

## 2017-10-18 DIAGNOSIS — O0990 Supervision of high risk pregnancy, unspecified, unspecified trimester: Secondary | ICD-10-CM

## 2017-10-18 DIAGNOSIS — O09219 Supervision of pregnancy with history of pre-term labor, unspecified trimester: Secondary | ICD-10-CM

## 2017-10-18 DIAGNOSIS — Z3A34 34 weeks gestation of pregnancy: Secondary | ICD-10-CM

## 2017-10-18 MED ORDER — HYDROXYPROGESTERONE CAPROATE 250 MG/ML IM OIL
250.0000 mg | TOPICAL_OIL | Freq: Once | INTRAMUSCULAR | Status: AC
  Administered 2017-10-18: 250 mg via INTRAMUSCULAR

## 2017-10-18 NOTE — Telephone Encounter (Signed)
This encounter was created in error - please disregard.

## 2017-10-18 NOTE — Progress Notes (Signed)
Routine Prenatal Care Visit  Subjective  Laura Wiggins is a 32 y.o. G3P1011 at [redacted]w[redacted]d being seen today for ongoing prenatal care.  She is currently monitored for the following issues for this high-risk pregnancy and has Obesity; Depression; Erythromelalgia ([place]); Lower extremity edema; Chest pain; Supervision of high risk pregnancy, antepartum; History of preterm delivery, currently pregnant; Obesity affecting pregnancy; BMI 35.0-35.9,adult; and GBS bacteriuria on their problem list.  ----------------------------------------------------------------------------------- Patient reports no complaints.   Contractions: Not present. Vag. Bleeding: None.  Movement: Present. Denies leaking of fluid.  ----------------------------------------------------------------------------------- The following portions of the patient's history were reviewed and updated as appropriate: allergies, current medications, past family history, past medical history, past social history, past surgical history and problem list. Problem list updated.   Objective  Blood pressure 110/70, weight 242 lb (109.8 kg), last menstrual period 02/16/2017. Pregravid weight 214 lb (97.1 kg) Total Weight Gain 28 lb (12.7 kg) Urinalysis: Urine Protein: Negative Urine Glucose: Negative  Fetal Status: Fetal Heart Rate (bpm): 145 Fundal Height: 35 cm Movement: Present  Presentation: Vertex  General:  Alert, oriented and cooperative. Patient is in no acute distress.  Skin: Skin is warm and dry. No rash noted.   Cardiovascular: Normal heart rate noted  Respiratory: Normal respiratory effort, no problems with respiration noted  Abdomen: Soft, gravid, appropriate for gestational age. Pain/Pressure: Absent     Pelvic:  Cervical exam deferred        Extremities: Normal range of motion.     Mental Status: Normal mood and affect. Normal behavior. Normal judgment and thought content.   Assessment   32 y.o. G3P1011 at [redacted]w[redacted]d by  11/23/2017, by  Last Menstrual Period presenting for routine prenatal visit  Plan   pregnancy3 Problems (from 02/16/17 to present)    Problem Noted Resolved   Supervision of high risk pregnancy, antepartum 04/05/2017 by Will Bonnet, MD No   Overview Addendum 06/28/2017  5:14 PM by Will Bonnet, MD    Clinic [facility] Prenatal Labs  Dating L=6 Blood type: A/Positive/-- (11/02 1034)   Genetic Screen NIPS: diploid nml XX Antibody:Negative (11/02 1034)  Anatomic [place] Incomplete @ 18wks, [ ] f/u ord'd 1/25 Rubella: <0.90 (11/02 1034)  Varicella: Immune  GTT Early:147, 3h all nml        Third trimester: [] needs 3hr RPR: Non Reactive (11/02 1034)   Rhogam n/a HBsAg: Negative (11/02 1034)   TDaP vaccine                       Flu Shot: HIV: negative  Baby Food                                GBS:  Pos GBS bacteriuria at [place] -txed. Will need ppx for labor  Contraception  Pap:  CBB     CS/VBAC    Support Person         History of preterm delivery, currently pregnant 04/05/2017 by Will Bonnet, MD No   Overview Addendum 05/31/2017  3:19 PM by Will Bonnet, MD    [x]  17 OHPC. - ordered 12/28      Obesity affecting pregnancy 04/05/2017 by Will Bonnet, MD No   Overview Addendum 06/28/2017  5:12 PM by Will Bonnet, MD    [x]  early 1h gtt - 147, passed early 3h gtt all values      BMI 35.0-35.9,adult 04/05/2017 by Prentice Docker  D, MD No   GBS bacteriuria 04/05/2017 by Will Bonnet, MD No   Overview Addendum 04/05/2017 12:41 PM by Will Bonnet, MD    - treated with amoxicillin in early pregnancy [ ]  tx for GBS in labor      Depression 12/05/2015 by Coral Spikes, DO No   Overview Signed 04/05/2017 12:40 PM by Will Bonnet, MD    [ ]  Needs baseline EPDS        Preterm labor symptoms and general obstetric precautions including but not limited to vaginal bleeding, contractions, leaking of fluid and fetal movement were reviewed in detail with the patient. Please  refer to After Visit Summary for other counseling recommendations.   -17 OHP today - Breast/IUD  Return in about 2 weeks (around 11/01/2017) for Routine Prenatal Appointment, keep weekly 17OHP injection appointments.  Prentice Docker, MD, [name], [facility] 10/18/2017 5:10 PM

## 2017-10-25 ENCOUNTER — Ambulatory Visit (INDEPENDENT_AMBULATORY_CARE_PROVIDER_SITE_OTHER): Payer: BLUE CROSS/BLUE SHIELD

## 2017-10-25 DIAGNOSIS — O09213 Supervision of pregnancy with history of pre-term labor, third trimester: Secondary | ICD-10-CM | POA: Diagnosis not present

## 2017-10-25 MED ORDER — HYDROXYPROGESTERONE CAPROATE 250 MG/ML IM OIL: 250.0000 mg | TOPICAL_OIL | Freq: Once | INTRAMUSCULAR | Status: DC

## 2017-10-25 NOTE — Progress Notes (Signed)
Hydroxyprogesterone didn't arrive. Pt aware to arrive Wed 10/30/17

## 2017-11-01 ENCOUNTER — Ambulatory Visit (INDEPENDENT_AMBULATORY_CARE_PROVIDER_SITE_OTHER): Payer: BLUE CROSS/BLUE SHIELD | Admitting: Obstetrics & Gynecology

## 2017-11-01 ENCOUNTER — Encounter: Payer: BLUE CROSS/BLUE SHIELD | Admitting: Obstetrics & Gynecology

## 2017-11-01 VITALS — BP 120/80 | Wt 240.0 lb

## 2017-11-01 DIAGNOSIS — O0990 Supervision of high risk pregnancy, unspecified, unspecified trimester: Secondary | ICD-10-CM

## 2017-11-01 DIAGNOSIS — Z3A36 36 weeks gestation of pregnancy: Secondary | ICD-10-CM

## 2017-11-01 NOTE — Progress Notes (Signed)
  Subjective  Fetal Movement? yes Contractions? no Leaking Fluid? no Vaginal Bleeding? no  Objective  BP 120/80   Wt 240 lb (108.9 kg)   LMP 02/16/2017   BMI 39.94 kg/m  General: NAD Pumonary: no increased work of breathing Abdomen: gravid, non-tender Extremities: no edema Psychiatric: mood appropriate, affect full SVE 0/30/-3 Assessment  32 y.o. G3P1011 at 109w6d by  11/23/2017, by Last Menstrual Period presenting for routine prenatal visit  Plan   Problem List Items Addressed This Visit    None    Visit Diagnoses    [redacted] weeks gestation of pregnancy    -  Primary   Relevant Orders   Culture, beta strep (group b only)    h/o PTL, no more 17OHP Monitor FM PNV Labor precuations  Barnett Applebaum, MD, [name], [facility] 11/01/2017  2:47 PM

## 2017-11-04 ENCOUNTER — Encounter: Payer: Self-pay | Admitting: Obstetrics and Gynecology

## 2017-11-04 ENCOUNTER — Encounter: Payer: Self-pay | Admitting: Obstetrics & Gynecology

## 2017-11-04 ENCOUNTER — Ambulatory Visit (INDEPENDENT_AMBULATORY_CARE_PROVIDER_SITE_OTHER): Payer: BLUE CROSS/BLUE SHIELD | Admitting: Obstetrics and Gynecology

## 2017-11-04 VITALS — BP 118/72 | Wt 238.0 lb

## 2017-11-04 DIAGNOSIS — O0990 Supervision of high risk pregnancy, unspecified, unspecified trimester: Secondary | ICD-10-CM

## 2017-11-04 DIAGNOSIS — O0993 Supervision of high risk pregnancy, unspecified, third trimester: Secondary | ICD-10-CM

## 2017-11-04 DIAGNOSIS — O09219 Supervision of pregnancy with history of pre-term labor, unspecified trimester: Secondary | ICD-10-CM

## 2017-11-04 DIAGNOSIS — O99213 Obesity complicating pregnancy, third trimester: Secondary | ICD-10-CM | POA: Diagnosis not present

## 2017-11-04 DIAGNOSIS — O09899 Supervision of other high risk pregnancies, unspecified trimester: Secondary | ICD-10-CM

## 2017-11-04 DIAGNOSIS — O4693 Antepartum hemorrhage, unspecified, third trimester: Secondary | ICD-10-CM

## 2017-11-04 NOTE — Patient Instructions (Signed)

## 2017-11-04 NOTE — Progress Notes (Signed)
Work in [employer] pt c/o mild vaginal bleeding off and on since cervical check on Friday 5/31. Pt has cramping also. Baby moving well, braxton hicks ctx.

## 2017-11-04 NOTE — Progress Notes (Signed)
Routine Prenatal Care Visit  Subjective  Laura Wiggins is a 32 y.o. G3P1011 at [redacted]w[redacted]d being seen today for ongoing prenatal care.  She is currently monitored for the following issues for this high-risk pregnancy and has Obesity; Depression; Erythromelalgia ([place]); Lower extremity edema; Chest pain; Supervision of high risk pregnancy, antepartum; History of preterm delivery, currently pregnant; Obesity affecting pregnancy; BMI 35.0-35.9,adult; and GBS bacteriuria on their problem list.  ----------------------------------------------------------------------------------- Patient reports small vaginal spotting on and off sicne she was digitally examined by Dr. Kenton Kingfisher. Spotting of brown or bright red blood on and off for several days. No heavy bleeding. No passage of blood clots. Still percieves fetal movement, but diminished which has been going on since before her 36 week appointment..   Contractions: Irregular. Vag. Bleeding: None.  Movement: Present. Denies leaking of fluid.  ----------------------------------------------------------------------------------- The following portions of the patient's history were reviewed and updated as appropriate: allergies, current medications, past family history, past medical history, past social history, past surgical history and problem list. Problem list updated.   Objective  Blood pressure 118/72, weight 238 lb (108 kg), last menstrual period 02/16/2017. Pregravid weight 214 lb (97.1 kg) Total Weight Gain 24 lb (10.9 kg) Urinalysis: Urine Protein: Negative Urine Glucose: Negative  Fetal Status: Fetal Heart Rate (bpm): 140 Fundal Height: 37 cm Movement: Present  Presentation: Vertex  General:  Alert, oriented and cooperative. Patient is in no acute distress.  Skin: Skin is warm and dry. No rash noted.   Cardiovascular: Normal heart rate noted  Respiratory: Normal respiratory effort, no problems with respiration noted  Abdomen: Soft, gravid,  appropriate for gestational age. Pain/Pressure: Present     Pelvic:  Cervical exam performed Dilation: Fingertip Effacement (%): 30 Station: -3  Extremities: Normal range of motion.     ental Status: Normal mood and affect. Normal behavior. Normal judgment and thought content.   NST: 15 bpm baseline, moderate variability, more than 2 15x15 accelerations, no decelerations. Reactive, Category I tracing.    Assessment   32 y.o. G3P1011 at [redacted]w[redacted]d by  11/23/2017, by Last Menstrual Period presenting for work-in prenatal visit  Plan   pregnancy3 Problems (from 02/16/17 to present)    Problem Noted Resolved   Supervision of high risk pregnancy, antepartum 04/05/2017 by Will Bonnet, MD No   Overview Addendum 06/28/2017  5:14 PM by Will Bonnet, MD    [facility]inic [facility] Prenatal Labs  Dating L=6 Blood type: A/Positive/-- (11/02 1034)   Genetic Screen NIPS: diploid nml XX Antibody:Negative (11/02 1034)  Anatomic [place] Incomplete @ 18wks, [ ] f/u ord'd 1/25 Rubella: <0.90 (11/02 1034)  Varicella: Immune  GTT Early:147, 3h all nml        Third trimester: [] needs 3hr RPR: Non Reactive (11/02 1034)   Rhogam n/a HBsAg: Negative (11/02 1034)   TDaP vaccine                       Flu Shot: HIV: negative  Baby Food                                GBS:  Pos GBS bacteriuria at [place] -txed. Will need ppx for labor  Contraception  Pap:  CBB     CS/VBAC    Support Person            History of preterm delivery, currently pregnant 04/05/2017 by Will Bonnet, MD No  Overview Addendum 05/31/2017  3:19 PM by Will Bonnet, MD    [x]  17 OHPC. - ordered 12/28      Obesity affecting pregnancy 04/05/2017 by Will Bonnet, MD No   Overview Addendum 06/28/2017  5:12 PM by Will Bonnet, MD    [x]  early 1h gtt - 147, passed early 3h gtt all values      BMI 35.0-35.9,adult 04/05/2017 by Will Bonnet, MD No   GBS bacteriuria 04/05/2017 by Will Bonnet, MD No   Overview Addendum  04/05/2017 12:41 PM by Will Bonnet, MD    [ ]  treated with amoxicillin in early pregnancy [ ]  tx for GBS in labor      Depression 12/05/2015 by Coral Spikes, DO No   Overview Signed 04/05/2017 12:40 PM by Will Bonnet, MD    [ ]  Needs baseline EPDS          Gestational age appropriate obstetric precautions including but not limited to vaginal bleeding, contractions, leaking of fluid and fetal movement were reviewed in detail with the patient.    No active vaginal bleeding seen. No blood in vaginal vault. Reassurance given. Discussed fetal kick counts, patient given a form to complete kick counts at home.   Return in about 1 week (around 11/11/2017) for [place] .  Adrian Prows MD [facility], [facility] 11/04/17 2:08 PM

## 2017-11-05 LAB — CULTURE, BETA STREP (GROUP B ONLY): Strep Gp B Culture: NEGATIVE

## 2017-11-08 ENCOUNTER — Ambulatory Visit (INDEPENDENT_AMBULATORY_CARE_PROVIDER_SITE_OTHER): Payer: BLUE CROSS/BLUE SHIELD | Admitting: Obstetrics & Gynecology

## 2017-11-08 ENCOUNTER — Ambulatory Visit: Payer: BLUE CROSS/BLUE SHIELD

## 2017-11-08 VITALS — BP 120/80 | Wt 248.0 lb

## 2017-11-08 DIAGNOSIS — O09899 Supervision of other high risk pregnancies, unspecified trimester: Secondary | ICD-10-CM

## 2017-11-08 DIAGNOSIS — O99213 Obesity complicating pregnancy, third trimester: Secondary | ICD-10-CM

## 2017-11-08 DIAGNOSIS — O09219 Supervision of pregnancy with history of pre-term labor, unspecified trimester: Secondary | ICD-10-CM

## 2017-11-08 DIAGNOSIS — Z3A37 37 weeks gestation of pregnancy: Secondary | ICD-10-CM

## 2017-11-08 DIAGNOSIS — O0990 Supervision of high risk pregnancy, unspecified, unspecified trimester: Secondary | ICD-10-CM

## 2017-11-08 NOTE — Progress Notes (Signed)
  Subjective  Fetal Movement? yes Contractions? Yes, occas BHs Leaking Fluid? no Vaginal Bleeding? no DFM but still several times per day Objective  BP 120/80   Wt 248 lb (112.5 kg)   LMP 02/16/2017   BMI 41.27 kg/m  General: NAD Pumonary: no increased work of breathing Abdomen: gravid, non-tender Extremities: no edema Psychiatric: mood appropriate, affect full  Assessment  32 y.o. G3P1011 at [redacted]w[redacted]d by  11/23/2017, by Last Menstrual Period presenting for routine prenatal visit  Plan   Problem List Items Addressed This Visit      Other   Supervision of high risk pregnancy, antepartum   History of preterm delivery, currently pregnant   Obesity affecting pregnancy    Other Visit Diagnoses    [redacted] weeks gestation of pregnancy    -  Primary    Labor precauitions  Barnett Applebaum, MD, [name], [facility] 11/08/2017  3:18 PM

## 2017-11-12 ENCOUNTER — Encounter: Payer: Self-pay | Admitting: Obstetrics & Gynecology

## 2017-11-12 NOTE — Telephone Encounter (Signed)
Should she come in ?

## 2017-11-13 ENCOUNTER — Inpatient Hospital Stay
Admission: EM | Admit: 2017-11-13 | Discharge: 2017-11-16 | DRG: 787 | Disposition: A | Discharge status: Home / Self Care | Payer: BLUE CROSS/BLUE SHIELD | Attending: Maternal Newborn | Admitting: Maternal Newborn

## 2017-11-13 ENCOUNTER — Other Ambulatory Visit: Payer: Self-pay

## 2017-11-13 DIAGNOSIS — O4292 Full-term premature rupture of membranes, unspecified as to length of time between rupture and onset of labor: Secondary | ICD-10-CM | POA: Diagnosis present

## 2017-11-13 DIAGNOSIS — O4290 Premature rupture of membranes, unspecified as to length of time between rupture and onset of labor, unspecified weeks of gestation: Secondary | ICD-10-CM | POA: Diagnosis present

## 2017-11-13 DIAGNOSIS — O9081 Anemia of the puerperium: Secondary | ICD-10-CM | POA: Diagnosis not present

## 2017-11-13 DIAGNOSIS — O368130 Decreased fetal movements, third trimester, not applicable or unspecified: Secondary | ICD-10-CM | POA: Diagnosis present

## 2017-11-13 DIAGNOSIS — D62 Acute posthemorrhagic anemia: Secondary | ICD-10-CM | POA: Diagnosis not present

## 2017-11-13 DIAGNOSIS — Z3A38 38 weeks gestation of pregnancy: Secondary | ICD-10-CM | POA: Diagnosis not present

## 2017-11-13 DIAGNOSIS — Z87891 Personal history of nicotine dependence: Secondary | ICD-10-CM | POA: Diagnosis not present

## 2017-11-13 DIAGNOSIS — K219 Gastro-esophageal reflux disease without esophagitis: Secondary | ICD-10-CM | POA: Diagnosis present

## 2017-11-13 DIAGNOSIS — O99824 Streptococcus B carrier state complicating childbirth: Secondary | ICD-10-CM | POA: Diagnosis present

## 2017-11-13 DIAGNOSIS — O9962 Diseases of the digestive system complicating childbirth: Secondary | ICD-10-CM | POA: Diagnosis present

## 2017-11-13 HISTORY — DX: Premature rupture of membranes, unspecified as to length of time between rupture and onset of labor, unspecified weeks of gestation: O42.90

## 2017-11-13 LAB — CBC
HCT: 33.6 % — ABNORMAL LOW (ref 35.0–47.0)
Hemoglobin: 11.8 g/dL — ABNORMAL LOW (ref 12.0–16.0)
MCH: 30.2 pg (ref 26.0–34.0)
MCHC: 35.3 g/dL (ref 32.0–36.0)
MCV: 85.6 fL (ref 80.0–100.0)
Platelets: 212 10*3/uL (ref 150–440)
RBC: 3.92 MIL/uL (ref 3.80–5.20)
RDW: 15.9 % — ABNORMAL HIGH (ref 11.5–14.5)
WBC: 8 10*3/uL (ref 3.6–11.0)

## 2017-11-13 LAB — TYPE AND SCREEN
ABO/RH(D): A POS
Antibody Screen: NEGATIVE

## 2017-11-13 MED ORDER — ACETAMINOPHEN 325 MG PO TABS
650.0000 mg | ORAL_TABLET | ORAL | Status: DC | PRN
  Administered 2017-11-13: 650 mg via ORAL
  Filled 2017-11-13: qty 2

## 2017-11-13 MED ORDER — ONDANSETRON HCL 4 MG/2ML IJ SOLN: 4.0000 mg | Freq: Four times a day (QID) | INTRAMUSCULAR | Status: DC | PRN

## 2017-11-13 MED ORDER — OXYTOCIN 40 UNITS IN LACTATED RINGERS INFUSION - SIMPLE MED
2.5000 [IU]/h | INTRAVENOUS | Status: DC
  Administered 2017-11-14: 500 mL via INTRAVENOUS
  Filled 2017-11-13 (×2): qty 1000

## 2017-11-13 MED ORDER — OXYTOCIN 40 UNITS IN LACTATED RINGERS INFUSION - SIMPLE MED
1.0000 m[IU]/min | INTRAVENOUS | Status: DC
  Administered 2017-11-13: 1 m[IU]/min via INTRAVENOUS

## 2017-11-13 MED ORDER — OXYTOCIN BOLUS FROM INFUSION: 500.0000 mL | Freq: Once | INTRAVENOUS | Status: DC

## 2017-11-13 MED ORDER — FENTANYL CITRATE (PF) 100 MCG/2ML IJ SOLN
50.0000 ug | INTRAMUSCULAR | Status: DC | PRN
  Administered 2017-11-13: 50 ug via INTRAVENOUS
  Filled 2017-11-13: qty 2

## 2017-11-13 MED ORDER — MISOPROSTOL 200 MCG PO TABS
ORAL_TABLET | ORAL | Status: AC
  Filled 2017-11-13: qty 4

## 2017-11-13 MED ORDER — LACTATED RINGERS IV SOLN
INTRAVENOUS | Status: DC
  Administered 2017-11-14: 09:00:00 via INTRAVENOUS

## 2017-11-13 MED ORDER — PENICILLIN G POTASSIUM 5000000 UNITS IJ SOLR
5.0000 10*6.[IU] | Freq: Once | INTRAMUSCULAR | Status: AC
  Administered 2017-11-13: 5 10*6.[IU] via INTRAVENOUS
  Filled 2017-11-13: qty 5

## 2017-11-13 MED ORDER — LIDOCAINE HCL (PF) 1 % IJ SOLN
INTRAMUSCULAR | Status: AC
  Filled 2017-11-13: qty 30

## 2017-11-13 MED ORDER — BUPROPION HCL ER (XL) 300 MG PO TB24
300.0000 mg | ORAL_TABLET | Freq: Every day | ORAL | Status: DC
  Administered 2017-11-14: 300 mg via ORAL
  Filled 2017-11-13: qty 1

## 2017-11-13 MED ORDER — OXYTOCIN 10 UNIT/ML IJ SOLN
INTRAMUSCULAR | Status: AC
  Filled 2017-11-13: qty 2

## 2017-11-13 MED ORDER — AMMONIA AROMATIC IN INHA
RESPIRATORY_TRACT | Status: AC
  Filled 2017-11-13: qty 10

## 2017-11-13 MED ORDER — TERBUTALINE SULFATE 1 MG/ML IJ SOLN: 0.2500 mg | Freq: Once | INTRAMUSCULAR | Status: DC | PRN

## 2017-11-13 MED ORDER — PENICILLIN G POT IN DEXTROSE 60000 UNIT/ML IV SOLN
3.0000 10*6.[IU] | INTRAVENOUS | Status: DC
  Administered 2017-11-13 – 2017-11-14 (×5): 3 10*6.[IU] via INTRAVENOUS
  Filled 2017-11-13 (×15): qty 50

## 2017-11-13 MED ORDER — LACTATED RINGERS IV SOLN
500.0000 mL | INTRAVENOUS | Status: DC | PRN
  Administered 2017-11-13 – 2017-11-14 (×2): 500 mL via INTRAVENOUS

## 2017-11-13 NOTE — H&P (Addendum)
Obstetrics Admission History & Physical   PROM   HPI:  32 y.o. G3P1011 @ [redacted]w[redacted]d (11/23/2017, by Last Menstrual Period). Admitted on 11/13/2017:   Patient Active Problem List   Diagnosis Date Noted  . PROM (premature rupture of membranes) 11/13/2017  . Supervision of high risk pregnancy, antepartum 04/05/2017  . History of preterm delivery, currently pregnant 04/05/2017  . Obesity affecting pregnancy 04/05/2017  . BMI 35.0-35.9,adult 04/05/2017  . GBS bacteriuria 04/05/2017  . Chest pain 10/22/2016  . Lower extremity edema 09/11/2016  . Erythromelalgia ([place]) 08/07/2016  . Depression 12/05/2015  . Obesity 02/25/2015     Presents for rupture of membranes that occurred this morning around 0515. She had been having contractions yesterday ranging from 12-20 minutes apart. They stopped overnight. She felt a gush of fluid this morning, which was thin and clear. She has been having irregular contractions since then. She rates the intensity as 1/10.  No headaches or vaginal bleeding. Good fetal movement.  P[facility]atal care at: at [facility]. Pregnancy complicated by Group B strep.  ROS: A review of systems was performed and negative, except as stated in the above HPI.  PMHx:  Past Medical History:  Diagnosis Date  . Cancer ([place])    Melanoma  . Chicken pox   . Depression   . Endometriosis 2004  . Frequent headaches   . Hx of migraines   . Interstitial cystitis 2004  . Kidney stones   . Urinary incontinence    PSHx:  Past Surgical History:  Procedure Laterality Date  . TONSILLECTOMY AND ADENOIDECTOMY  2013   Medications:  Medications Prior to Admission  Medication Sig Dispense Refill Last Dose  . buPROPion (WELLBUTRIN XL) 300 MG 24 hr tablet Take 1 tablet (300 mg total) by mouth daily. 90 tablet 3 Taking  . Butalbital-APAP-Caffeine 50-325-40 MG capsule Take 1-2 capsules by mouth every 6 (six) hours as needed for headache. 30 capsule 3 Taking  . Prenat w/o A-FeCbGl-DSS-FA-DHA  (CITRANATAL 90 DHA) 90-1 & 300 MG MISC Take 1 tablet daily by mouth. 30 each 11 Taking   Allergies: has No Known Allergies. OBHx:  OB History  Gravida Para Term Preterm AB Living  3 1 1   1 1   SAB TAB Ectopic Multiple Live Births               # Outcome Date GA Lbr Len/2nd Weight Sex Delivery Anes PTL Lv  3 Current           2 AB           1 Term            EGB:TDVVO than listed in HPI remarkable for: Diabetes, hypertension, and hyperlipidemia in her mother.  No family history of birth defects. Soc Hx: Former smoker and Alcohol: occasionally prior to pregnancy. Substance use: None. Pregnancy welcomed.  Objective:  There were no vitals filed for this visit. Constitutional: Well nourished, well developed female in no acute distress.  HEENT: normal Skin: Warm and dry.  Cardiovascular:Regular rate and rhythm.   Extremity: 1+ bilateral pedal edema Respiratory: Clear to auscultation bilateral. Normal respiratory effort Abdomen: mild Back: no CVAT Neuro: DTRs 2+, Cranial nerves grossly intact Psych: Alert and Oriented x3. No memory deficits. Normal mood and affect.  MS: normal gait, normal bilateral lower extremity ROM/strength/stability.  Pelvic exam: is not limited by body habitus External Genitalia, Bartholin's glands, Urethra, Skene's glands: within normal limits Vagina: within normal limits and with normal mucosa blood in the vault  Cervix: 1 cm Uterus: Spontaneous uterine activity  Adnexa: not evaluated  Clear fluid, nitrazine positive.  EFM:FHR: 140 bpm, variability: moderate, accelerations: Present,  decelerations: Absent Toco: Frequency: Every 4-7 minutes, duration 60-90 seconds, intensity, mild   Perinatal info:  Blood type: A positive Rubella - Not immune Varicella - Immune TDaP Given during third trimester of this pregnancy on 09/19/2017. RPR NR / HIV Neg/ HBsAg Neg   Assessment & Plan:   32 y.o. G3P1011 @ [redacted]w[redacted]d, Admitted on 11/13/2017: with premature rupture of  membranes at term.  Admit for labor, Antibiotics for GBS prophylaxis, Observe for cervical change, Fetal Wellbeing Reassuring, Epidural when ready and AROM when Appropriate, consider labor augmentation as needed.  Avel Sensor, CNM [facility], [facility] 11/13/2017  11:08 AM

## 2017-11-13 NOTE — Progress Notes (Signed)
  Labor Progress Note   32 y.o. [id number] @ [redacted]w[redacted]d , admitted for  Pregnancy, Labor Management. PROM  Subjective:  Uncomfortable with contractions, feeling them more as pressure, rating pain 5/10.  Objective:  BP 121/81   Pulse 81   Temp 98.1 F (36.7 C) (Oral)   Resp 16   Ht 5\' 4"  (1.626 m)   Wt 240 lb (108.9 kg)   LMP 02/16/2017   SpO2 100%   BMI 41.20 kg/m  Abd: moderate Extr: trace to 1+ bilateral pedal edema SVE: Cervix unchanged at 1/50/-3 (RN exam)  EFM: FHR: 135 bpm, variability: moderate,  accelerations:  Present,  decelerations:  Present Variable Toco: Frequency: Every 1.5-3 minutes, Duration: 40-90 seconds and Intensity: mild to moderate Labs: I have reviewed the patient's lab results.   Assessment & Plan:  G3P1011 @ [redacted]w[redacted]d, admitted for  Pregnancy and Labor/Delivery Management  1. Pain management: currently no pharmacological measures desired. 2. FWB: FHT category II.  3. ID: GBS positive, continue antibiotics 4. Labor management: Variable decelerations responded favorably when she was repositioned. Keep Pitocin at current level, consider a break/restarting.  Avel Sensor, CNM 11/13/2017

## 2017-11-13 NOTE — Progress Notes (Signed)
  Labor Progress Note   32 y.o. [id number] @ [redacted]w[redacted]d , admitted for  Pregnancy, Labor Management, PROM.  Subjective:  No increase in discomfort; contractions have been feeling about the same since arrival.  Objective:  BP 127/79 (BP Location: Left Arm)   Pulse 89   Temp 98.1 F (36.7 C) (Oral)   Resp 16   Ht 5\' 4"  (1.626 m)   Wt 240 lb (108.9 kg)   LMP 02/16/2017   SpO2 100%   BMI 41.20 kg/m  Abd: mild Extr: trace to 1+ bilateral pedal edema SVE: deferred  EFM: FHR: 135 bpm, variability: moderate, accelerations: Present,  decelerations: Absent Toco: irregular contractions Labs: I have reviewed the patient's lab results.   Assessment & Plan:  G3P1011 @ [redacted]w[redacted]d, admitted for  Pregnancy and Labor/Delivery Management  1. Pain management: epidural when ready, nitrous or IV pain medication as desired. 2. FWB: FHT category I. 3. ID: GBS positive 4. Labor management: Begin Pitocin augmentation due to inadequate uterine activity in the setting of PROM.  All discussed with patient, see orders.  Avel Sensor, CNM 11/13/2017  2:15 PM

## 2017-11-14 ENCOUNTER — Encounter
Admission: EM | Disposition: A | Discharge status: Home / Self Care | Payer: Self-pay | Source: Home / Self Care | Attending: Maternal Newborn

## 2017-11-14 ENCOUNTER — Inpatient Hospital Stay: Payer: BLUE CROSS/BLUE SHIELD | Admitting: Registered Nurse

## 2017-11-14 ENCOUNTER — Telehealth: Payer: Self-pay

## 2017-11-14 DIAGNOSIS — Z3A38 38 weeks gestation of pregnancy: Secondary | ICD-10-CM

## 2017-11-14 DIAGNOSIS — O4292 Full-term premature rupture of membranes, unspecified as to length of time between rupture and onset of labor: Secondary | ICD-10-CM

## 2017-11-14 LAB — RPR: RPR: NONREACTIVE

## 2017-11-14 SURGERY — Surgical Case
Anesthesia: Epidural | Wound class: Clean Contaminated

## 2017-11-14 MED ORDER — SOD CITRATE-CITRIC ACID 500-334 MG/5ML PO SOLN
ORAL | Status: AC
  Administered 2017-11-14: 30 mL via ORAL
  Filled 2017-11-14: qty 15

## 2017-11-14 MED ORDER — PRENATAL MULTIVITAMIN CH
1.0000 | ORAL_TABLET | Freq: Every day | ORAL | Status: DC
  Administered 2017-11-15 – 2017-11-16 (×2): 1 via ORAL
  Filled 2017-11-14 (×2): qty 1

## 2017-11-14 MED ORDER — NALOXONE HCL 0.4 MG/ML IJ SOLN: 0.4000 mg | INTRAMUSCULAR | Status: DC | PRN

## 2017-11-14 MED ORDER — KETOROLAC TROMETHAMINE 30 MG/ML IJ SOLN
INTRAMUSCULAR | Status: AC
  Administered 2017-11-14: 30 mg via INTRAVENOUS
  Filled 2017-11-14: qty 1

## 2017-11-14 MED ORDER — ONDANSETRON HCL 4 MG/2ML IJ SOLN: 4.0000 mg | Freq: Three times a day (TID) | INTRAMUSCULAR | Status: DC | PRN

## 2017-11-14 MED ORDER — DIPHENHYDRAMINE HCL 50 MG/ML IJ SOLN: 12.5000 mg | INTRAMUSCULAR | Status: DC | PRN

## 2017-11-14 MED ORDER — MORPHINE SULFATE (PF) 0.5 MG/ML IJ SOLN
INTRAMUSCULAR | Status: AC
  Filled 2017-11-14: qty 10

## 2017-11-14 MED ORDER — DIBUCAINE 1 % RE OINT: 1.0000 "application " | TOPICAL_OINTMENT | RECTAL | Status: DC | PRN

## 2017-11-14 MED ORDER — FENTANYL 2.5 MCG/ML W/ROPIVACAINE 0.15% IN NS 100 ML EPIDURAL (ARMC)
EPIDURAL | Status: AC
  Filled 2017-11-14: qty 100

## 2017-11-14 MED ORDER — PHENYLEPHRINE HCL 10 MG/ML IJ SOLN
INTRAMUSCULAR | Status: DC | PRN
  Administered 2017-11-14: 100 ug via INTRAVENOUS

## 2017-11-14 MED ORDER — BUPIVACAINE HCL (PF) 0.5 % IJ SOLN: 10.0000 mL | Freq: Once | INTRAMUSCULAR | Status: DC

## 2017-11-14 MED ORDER — FENTANYL 2.5 MCG/ML W/ROPIVACAINE 0.15% IN NS 100 ML EPIDURAL (ARMC): 12.0000 mL/h | EPIDURAL | Status: DC

## 2017-11-14 MED ORDER — WITCH HAZEL-GLYCERIN EX PADS: 1.0000 "application " | MEDICATED_PAD | CUTANEOUS | Status: DC | PRN

## 2017-11-14 MED ORDER — MORPHINE SULFATE (PF) 0.5 MG/ML IJ SOLN
INTRAMUSCULAR | Status: DC | PRN
  Administered 2017-11-14: 2 mg via EPIDURAL

## 2017-11-14 MED ORDER — BUPIVACAINE HCL (PF) 0.25 % IJ SOLN
INTRAMUSCULAR | Status: DC | PRN
  Administered 2017-11-14 (×2): 5 mL via EPIDURAL

## 2017-11-14 MED ORDER — BISACODYL 10 MG RE SUPP: 10.0000 mg | Freq: Every day | RECTAL | Status: DC | PRN

## 2017-11-14 MED ORDER — FENTANYL CITRATE (PF) 100 MCG/2ML IJ SOLN: 25.0000 ug | INTRAMUSCULAR | Status: DC | PRN

## 2017-11-14 MED ORDER — KETOROLAC TROMETHAMINE 30 MG/ML IJ SOLN
30.0000 mg | Freq: Four times a day (QID) | INTRAMUSCULAR | Status: AC | PRN
  Administered 2017-11-14 – 2017-11-15 (×3): 30 mg via INTRAVENOUS
  Filled 2017-11-14 (×2): qty 1

## 2017-11-14 MED ORDER — MENTHOL 3 MG MT LOZG
1.0000 | LOZENGE | OROMUCOSAL | Status: DC | PRN
  Filled 2017-11-14: qty 9

## 2017-11-14 MED ORDER — SODIUM CHLORIDE 0.9% FLUSH: 3.0000 mL | INTRAVENOUS | Status: DC | PRN

## 2017-11-14 MED ORDER — SENNOSIDES-DOCUSATE SODIUM 8.6-50 MG PO TABS
2.0000 | ORAL_TABLET | ORAL | Status: DC
  Administered 2017-11-15: 2 via ORAL
  Filled 2017-11-14 (×2): qty 2

## 2017-11-14 MED ORDER — NALOXONE HCL 4 MG/10ML IJ SOLN
1.0000 ug/kg/h | INTRAVENOUS | Status: DC | PRN
  Filled 2017-11-14: qty 5

## 2017-11-14 MED ORDER — ZOLPIDEM TARTRATE 5 MG PO TABS
5.0000 mg | ORAL_TABLET | Freq: Every evening | ORAL | Status: DC | PRN
  Administered 2017-11-14: 5 mg via ORAL
  Filled 2017-11-14: qty 1

## 2017-11-14 MED ORDER — PHENYLEPHRINE 40 MCG/ML (10ML) SYRINGE FOR IV PUSH (FOR BLOOD PRESSURE SUPPORT): 80.0000 ug | PREFILLED_SYRINGE | INTRAVENOUS | Status: DC | PRN

## 2017-11-14 MED ORDER — NALBUPHINE HCL 10 MG/ML IJ SOLN: 5.0000 mg | Freq: Once | INTRAMUSCULAR | Status: DC | PRN

## 2017-11-14 MED ORDER — LACTATED RINGERS IV SOLN
INTRAVENOUS | Status: DC
  Administered 2017-11-14 (×2): via INTRAVENOUS

## 2017-11-14 MED ORDER — LIDOCAINE 2% (20 MG/ML) 5 ML SYRINGE
INTRAMUSCULAR | Status: DC | PRN
  Administered 2017-11-14 (×2): 5 mg via INTRAVENOUS

## 2017-11-14 MED ORDER — DIPHENHYDRAMINE HCL 25 MG PO CAPS: 25.0000 mg | ORAL_CAPSULE | Freq: Four times a day (QID) | ORAL | Status: DC | PRN

## 2017-11-14 MED ORDER — OXYTOCIN 40 UNITS IN LACTATED RINGERS INFUSION - SIMPLE MED
2.5000 [IU]/h | INTRAVENOUS | Status: AC
  Filled 2017-11-14: qty 1000

## 2017-11-14 MED ORDER — EPHEDRINE 5 MG/ML INJ: 10.0000 mg | INTRAVENOUS | Status: DC | PRN

## 2017-11-14 MED ORDER — CEFAZOLIN SODIUM-DEXTROSE 2-4 GM/100ML-% IV SOLN
2.0000 g | INTRAVENOUS | Status: AC
  Administered 2017-11-14: 2 g via INTRAVENOUS
  Filled 2017-11-14: qty 100

## 2017-11-14 MED ORDER — LIDOCAINE HCL (PF) 1 % IJ SOLN
INTRAMUSCULAR | Status: DC | PRN
  Administered 2017-11-14: 3 mL

## 2017-11-14 MED ORDER — COCONUT OIL OIL: 1.0000 "application " | TOPICAL_OIL | Status: DC | PRN

## 2017-11-14 MED ORDER — ONDANSETRON HCL 4 MG/2ML IJ SOLN
INTRAMUSCULAR | Status: DC | PRN
  Administered 2017-11-14: 4 mg via INTRAVENOUS

## 2017-11-14 MED ORDER — MEPERIDINE HCL 25 MG/ML IJ SOLN: 6.2500 mg | INTRAMUSCULAR | Status: DC | PRN

## 2017-11-14 MED ORDER — FLEET ENEMA 7-19 GM/118ML RE ENEM: 1.0000 | ENEMA | Freq: Every day | RECTAL | Status: DC | PRN

## 2017-11-14 MED ORDER — IBUPROFEN 600 MG PO TABS
600.0000 mg | ORAL_TABLET | Freq: Four times a day (QID) | ORAL | Status: DC
  Administered 2017-11-15 – 2017-11-16 (×6): 600 mg via ORAL
  Filled 2017-11-14 (×6): qty 1

## 2017-11-14 MED ORDER — SOD CITRATE-CITRIC ACID 500-334 MG/5ML PO SOLN
30.0000 mL | ORAL | Status: AC
  Administered 2017-11-14: 30 mL via ORAL

## 2017-11-14 MED ORDER — OXYCODONE-ACETAMINOPHEN 5-325 MG PO TABS: 1.0000 | ORAL_TABLET | ORAL | Status: DC | PRN

## 2017-11-14 MED ORDER — FERROUS SULFATE 325 (65 FE) MG PO TABS
325.0000 mg | ORAL_TABLET | Freq: Two times a day (BID) | ORAL | Status: DC
  Administered 2017-11-15 – 2017-11-16 (×4): 325 mg via ORAL
  Filled 2017-11-14 (×4): qty 1

## 2017-11-14 MED ORDER — BUPIVACAINE HCL (PF) 0.5 % IJ SOLN
INTRAMUSCULAR | Status: AC
  Filled 2017-11-14: qty 30

## 2017-11-14 MED ORDER — LACTATED RINGERS IV SOLN: 500.0000 mL | Freq: Once | INTRAVENOUS | Status: DC

## 2017-11-14 MED ORDER — NALBUPHINE HCL 10 MG/ML IJ SOLN: 5.0000 mg | INTRAMUSCULAR | Status: DC | PRN

## 2017-11-14 MED ORDER — NALBUPHINE HCL 10 MG/ML IJ SOLN
5.0000 mg | INTRAMUSCULAR | Status: DC | PRN
  Administered 2017-11-14: 5 mg via INTRAVENOUS
  Filled 2017-11-14: qty 1

## 2017-11-14 MED ORDER — MEASLES, MUMPS & RUBELLA VAC ~~LOC~~ INJ
0.5000 mL | INJECTION | Freq: Once | SUBCUTANEOUS | Status: AC
  Administered 2017-11-16: 0.5 mL via SUBCUTANEOUS
  Filled 2017-11-14 (×2): qty 0.5

## 2017-11-14 MED ORDER — ONDANSETRON HCL 4 MG/2ML IJ SOLN: 4.0000 mg | Freq: Once | INTRAMUSCULAR | Status: DC | PRN

## 2017-11-14 MED ORDER — SIMETHICONE 80 MG PO CHEW
80.0000 mg | CHEWABLE_TABLET | ORAL | Status: DC | PRN
  Administered 2017-11-14 – 2017-11-15 (×2): 80 mg via ORAL
  Filled 2017-11-14 (×2): qty 1

## 2017-11-14 MED ORDER — LIDOCAINE-EPINEPHRINE (PF) 1.5 %-1:200000 IJ SOLN
INTRAMUSCULAR | Status: DC | PRN
  Administered 2017-11-14: 3 mL via PERINEURAL

## 2017-11-14 MED ORDER — ACETAMINOPHEN 325 MG PO TABS
650.0000 mg | ORAL_TABLET | ORAL | Status: DC | PRN
  Administered 2017-11-15 – 2017-11-16 (×5): 650 mg via ORAL
  Filled 2017-11-14 (×5): qty 2

## 2017-11-14 MED ORDER — BUPIVACAINE 0.25 % ON-Q PUMP DUAL CATH 400 ML
400.0000 mL | INJECTION | Status: DC
  Filled 2017-11-14: qty 400

## 2017-11-14 MED ORDER — SIMETHICONE 80 MG PO CHEW: 80.0000 mg | CHEWABLE_TABLET | ORAL | Status: DC

## 2017-11-14 MED ORDER — SIMETHICONE 80 MG PO CHEW
80.0000 mg | CHEWABLE_TABLET | Freq: Three times a day (TID) | ORAL | Status: DC
  Administered 2017-11-15 – 2017-11-16 (×6): 80 mg via ORAL
  Filled 2017-11-14 (×6): qty 1

## 2017-11-14 MED ORDER — FENTANYL 2.5 MCG/ML W/ROPIVACAINE 0.15% IN NS 100 ML EPIDURAL (ARMC)
EPIDURAL | Status: DC | PRN
  Administered 2017-11-14: 12 mL/h via EPIDURAL

## 2017-11-14 MED ORDER — KETOROLAC TROMETHAMINE 30 MG/ML IJ SOLN: 30.0000 mg | Freq: Four times a day (QID) | INTRAMUSCULAR | Status: AC | PRN

## 2017-11-14 MED ORDER — EPHEDRINE SULFATE-NACL 50-0.9 MG/10ML-% IV SOSY
PREFILLED_SYRINGE | INTRAVENOUS | Status: DC | PRN
  Administered 2017-11-14: 10 mg via INTRAVENOUS

## 2017-11-14 SURGICAL SUPPLY — 24 items
CANISTER SUCT 3000ML PPV (MISCELLANEOUS) ×3 IMPLANT
CHLORAPREP W/TINT 26ML (MISCELLANEOUS) ×6 IMPLANT
DERMABOND ADVANCED (GAUZE/BANDAGES/DRESSINGS) ×2
DERMABOND ADVANCED .7 DNX12 (GAUZE/BANDAGES/DRESSINGS) ×1 IMPLANT
DRSG OPSITE POSTOP 4X10 (GAUZE/BANDAGES/DRESSINGS) ×3 IMPLANT
ELECT CAUTERY BLADE 6.4 (BLADE) ×3 IMPLANT
ELECT REM PT RETURN 9FT ADLT (ELECTROSURGICAL) ×3
ELECTRODE REM PT RTRN 9FT ADLT (ELECTROSURGICAL) ×1 IMPLANT
GLOVE BIOGEL PI IND STRL 6.5 (GLOVE) ×8 IMPLANT
GLOVE BIOGEL PI INDICATOR 6.5 (GLOVE) ×16
GOWN STRL REUS W/ TWL LRG LVL3 (GOWN DISPOSABLE) ×1 IMPLANT
GOWN STRL REUS W/ TWL XL LVL3 (GOWN DISPOSABLE) ×2 IMPLANT
GOWN STRL REUS W/TWL LRG LVL3 (GOWN DISPOSABLE) ×2
GOWN STRL REUS W/TWL XL LVL3 (GOWN DISPOSABLE) ×4
NS IRRIG 1000ML POUR BTL (IV SOLUTION) ×3 IMPLANT
PACK C SECTION AR (MISCELLANEOUS) ×3 IMPLANT
PAD OB MATERNITY 4.3X12.25 (PERSONAL CARE ITEMS) ×3 IMPLANT
PAD PREP 24X41 OB/GYN DISP (PERSONAL CARE ITEMS) ×3 IMPLANT
SUT CHROMIC 0 CT 1 (SUTURE) ×3 IMPLANT
SUT MNCRL AB 4-0 PS2 18 (SUTURE) ×3 IMPLANT
SUT PLAIN 3-0 (SUTURE) IMPLANT
SUT VIC AB 0 CT1 36 (SUTURE) ×12 IMPLANT
SUT VIC AB 2-0 CT1 36 (SUTURE) ×3 IMPLANT
SYR 30ML LL (SYRINGE) ×6 IMPLANT

## 2017-11-14 NOTE — Plan of Care (Signed)
VS stable, fundus firm 1 below umbilicus, honeycomb dressing clean/dry/intact, On Q pump infusing without difficulty, IV infusing site clean/dry/intact, foley catheter draining clear straw colored urine, pain well controlled with IV toradol, breast feeding with assistance and bonding well with infant, family at bedside and very supportive.

## 2017-11-14 NOTE — Anesthesia Procedure Notes (Signed)
Epidural Patient location during procedure: OB Start time: 11/14/2017 8:04 AM End time: 11/14/2017 8:15 AM  Staffing Anesthesiologist: Alvin Critchley, MD Resident/CRNA: Hedda Slade, CRNA Performed: resident/CRNA   Preanesthetic Checklist Completed: patient identified, site marked, surgical consent, pre-op evaluation, timeout performed, IV checked, risks and benefits discussed and monitors and equipment checked  Epidural Patient position: sitting Prep: ChloraPrep Patient monitoring: heart rate, continuous pulse ox and blood pressure Approach: midline Location: L3-L4 Injection technique: LOR saline  Needle:  Needle type: Tuohy  Needle gauge: 17 G Needle length: 9 cm and 9 Needle insertion depth: 7 cm Catheter type: closed end flexible Catheter size: 19 Gauge Catheter at skin depth: 13 cm Test dose: negative and 1.5% lidocaine with Epi 1:200 K  Assessment Events: blood not aspirated, injection not painful, no injection resistance, negative IV test and no paresthesia  Additional Notes 1 attempt Pt. Evaluated and documentation done after procedure finished. Patient identified. Risks/Benefits/Options discussed with patient including but not limited to bleeding, infection, nerve damage, paralysis, failed block, incomplete pain control, headache, blood pressure changes, nausea, vomiting, reactions to medication both or allergic, itching and postpartum back pain. Confirmed with bedside nurse the patient's most recent platelet count. Confirmed with patient that they are not currently taking any anticoagulation, have any bleeding history or any family history of bleeding disorders. Patient expressed understanding and wished to proceed. All questions were answered. Sterile technique was used throughout the entire procedure. Please see nursing notes for vital signs. Test dose was given through epidural catheter and negative prior to continuing to dose epidural or start infusion. Warning signs  of high block given to the patient including shortness of breath, tingling/numbness in hands, complete motor block, or any concerning symptoms with instructions to call for help. Patient was given instructions on fall risk and not to get out of bed. All questions and concerns addressed with instructions to call with any issues or inadequate analgesia.   Patient tolerated the insertion well without immediate complications.Reason for block:procedure for pain

## 2017-11-14 NOTE — Anesthesia Post-op Follow-up Note (Signed)
Anesthesia QCDR form completed.        

## 2017-11-14 NOTE — Op Note (Signed)
Cesarean Section Procedure Note 11/14/17  Pre-operative Diagnosis:Non reassuring fetal heart tones Post-operative Diagnosis: same, delivered. Procedure: Primary Low Transverse Cesarean Section Surgeon: Adrian Prows MD   Assistant(s): Malachy Mood MD FACOG - No other skilled surgical assistant available. Anesthesia: Epidural Estimated Blood Loss: 144 cc Complications: None; patient tolerated the procedure well.   Disposition: PACU - hemodynamically stable. Condition: stable   Findings: A female infant in the cephalic presentation. Occiput posterior Amniotic fluid - clear   Birth weight: 6 lbs 12oz Apgars of 9 and 9.  Intact placenta with a three-vessel cord. Grossly normal uterus, tubes and ovaries bilaterally. No intraabdominal adhesions were noted.   Procedure Details    The patient was taken to operating room, identified as the correct patient and the procedure verified as C-Section Delivery. A time out was held and the above information confirmed. After induction of anesthesia, the patient was draped and prepped in the usual sterile manner. A Pfannenstiel incision was made and carried down through the subcutaneous tissue to the fascia. Fascial incision was made and extended transversely with the Mayo scissors. The fascia was separated from the underlying rectus tissue superiorly and inferiorly. The peritoneum was identified and entered bluntly. Peritoneal incision was extended longitudinally. A low transverse hysterotomy was made. The fetus was delivered atraumatically with assistance from below and delivery of posterior arm. The umbilical cord was clamped x2 and cut and the infant was handed to the awaiting pediatricians. The placenta was removed intact and appeared normal with a 3-vessel cord. The uterus was exteriorized and cleared of all clot and debris. There was an extension in the right side of the uterus. The extension was repaired with running 0-vicryl suture. The  hysterotomy was closed with running locked sutures of 0 Vicryl suture. A second imbricating layer was placed with the same suture. One 0-chromic stitch was placed on the right side to obtain hemostasis. Excellent hemostasis was observed. The uterus was returned to the abdomen. The pelvis was irrigated and again, excellent hemostasis was noted. The peritoneum was closed with a running stitch of 2-0 Vicryl. The On Q Pain pump System was then placed.  Trocars were placed through the abdominal wall into the subfascial space and these were used to thread the silver soaker cathaters into place.The rectus muscles were inspected and were hemostatic. The rectus fascia was then reapproximated with running sutures of 0-vicryl, with careful placement not to incorporate the cathaters. Subcutaneous tissues are then irrigated with saline and hemostasis assured with the bovie. The subcutaneous fat was approximated with 3-0 plain and a running stitch.  Skin wass then closed with 4-0 monocryl suture in a subcuticular fashion followed by skin adhesive. The cathaters are flushed each with 5 mL of Bupivicaine and stabilized into place with dressing. Instrument, sponge, and needle counts were correct prior to the abdominal closure and at the conclusion of the case.  The patient tolerated the procedure well and was transferred to the recovery room in stable condition.   Homero Fellers MD [facility], [facility] 11/14/17 12:17 PM

## 2017-11-14 NOTE — Progress Notes (Signed)
During cesarean delivery, request for vaginal assistance from delivering MD. With sterile glove, I assisted with support of fetal head until Dr. Gilman Schmidt was able to bring infant head toward incision.

## 2017-11-14 NOTE — Plan of Care (Signed)
  Problem: Education: Goal: Knowledge of General Education information will improve Outcome: Progressing   Problem: Health Behavior/Discharge Planning: Goal: Ability to manage health-related needs will improve Outcome: Progressing   Problem: Clinical Measurements: Goal: Ability to maintain clinical measurements within normal limits will improve Outcome: Progressing Goal: Will remain free from infection Outcome: Progressing Goal: Diagnostic test results will improve Outcome: Progressing Goal: Respiratory complications will improve Outcome: Progressing Goal: Cardiovascular complication will be avoided Outcome: Progressing   Problem: Activity: Goal: Risk for activity intolerance will decrease Outcome: Progressing   Problem: Nutrition: Goal: Adequate nutrition will be maintained Outcome: Progressing   Problem: Coping: Goal: Level of anxiety will decrease Outcome: Progressing   Problem: Elimination: Goal: Will not experience complications related to bowel motility Outcome: Progressing Goal: Will not experience complications related to urinary retention Outcome: Progressing   Problem: Pain Managment: Goal: General experience of comfort will improve Outcome: Progressing   Problem: Safety: Goal: Ability to remain free from injury will improve Outcome: Progressing   Problem: Skin Integrity: Goal: Risk for impaired skin integrity will decrease Outcome: Progressing   Problem: Life Cycle: Goal: Ability to make normal progression through stages of labor will improve Outcome: Progressing   Problem: Pain Management: Goal: Relief or control of pain from uterine contractions will improve Outcome: Progressing   Problem: Education: Goal: Knowledge of Childbirth will improve Outcome: Progressing Goal: Ability to make informed decisions regarding treatment and plan of care will improve Outcome: Progressing Goal: Ability to state and carry out methods to decrease the pain  will improve Outcome: Progressing   Problem: Coping: Goal: Ability to verbalize concerns and feelings about labor and delivery will improve Outcome: Progressing   Problem: Life Cycle: Goal: Ability to make normal progression through stages of labor will improve Outcome: Progressing   Problem: Safety: Goal: Risk of complications during labor and delivery will decrease Outcome: Progressing   Problem: Pain Management: Goal: Relief or control of pain from uterine contractions will improve Outcome: Progressing

## 2017-11-14 NOTE — Anesthesia Preprocedure Evaluation (Signed)
Anesthesia Evaluation  Patient identified by MRN, date of birth, ID band  Reviewed: Allergy & Precautions, H&P , NPO status , Patient's Chart, lab work & pertinent test results  Airway Mallampati: III       Dental  (+) Teeth Intact   Pulmonary former smoker,    Pulmonary exam normal        Cardiovascular negative cardio ROS Normal cardiovascular exam     Neuro/Psych  Headaches, Depression    GI/Hepatic GERD (takes TUMS prn)  Controlled,  Endo/Other    Renal/GU      Musculoskeletal   Abdominal   Peds  Hematology negative hematology ROS (+)   Anesthesia Other Findings   Reproductive/Obstetrics (+) Pregnancy                             Anesthesia Physical Anesthesia Plan  ASA: II  Anesthesia Plan: Epidural   Post-op Pain Management:    Induction:   PONV Risk Score and Plan:   Airway Management Planned:   Additional Equipment:   Intra-op Plan:   Post-operative Plan:   Informed Consent:   Dental Advisory Given  Plan Discussed with: CRNA and Anesthesiologist  Anesthesia Plan Comments:         Anesthesia Quick Evaluation

## 2017-11-14 NOTE — Plan of Care (Signed)
Progressing throughout recovery period, education completed for recovery

## 2017-11-14 NOTE — Telephone Encounter (Signed)
FMLA/DISABILITY additional info filled out and give to JP (front desk) to obtain signature and process or give to [place] to process.

## 2017-11-14 NOTE — Progress Notes (Signed)
L&D Progress Note  S: Feeling no pain or pressure. Good relief with epidural  O: BP 115/65   Pulse 74   Temp 98.1 F (36.7 C) (Oral)   Resp 16   Ht 5\' 4"  (1.626 m)   Wt 108.9 kg (240 lb)   LMP 02/16/2017   SpO2 100%   BMI 41.20 kg/m    Received epidural this AM with good relief. Pitocin was turned off for epidural. Began having FHR decelerations after epidural to 60s and 70s x 30-60 minutes with contractions and on cervical exam was found to be C/C and -1 to 0 station. Baby in OP presentation. Tried to push initially with contractions-but no movement with pushing as mother was no feeling any pressure. Contractions then spaced out and decision made to stop pushing and start Pitocin back to stimulate more contractions to help push the baby down. The Pitocin was increased to 3 miu/min and contractions although infrequent were accompanied by FHR decelerations to 48-70s. The FHR between contractions was 130s with accelerations and moderate variability. O2 was applied earlier with decelerations and remained on. Dr Gilman Schmidt was consulted regarding the fetal intolerance to labor and she was in to evaluate and discuss options and their risks. Mutual decision was made to proceed with Cesarean section.  Dalia Heading, CNM

## 2017-11-14 NOTE — Plan of Care (Signed)
Education complete for labor and delivery prior to c/s delivery

## 2017-11-14 NOTE — Transfer of Care (Signed)
Immediate Anesthesia Transfer of Care Note  Patient: Laura Wiggins  Procedure(s) Performed: CESAREAN SECTION (N/A )  Patient Location: PACU  Anesthesia Type:Epidural  Level of Consciousness: awake, alert  and oriented  Airway & Oxygen Therapy: Patient Spontanous Breathing  Post-op Assessment: Report given to RN and Post -op Vital signs reviewed and stable  Post vital signs: Reviewed and stable  Last Vitals:  Vitals Value Taken Time  BP 108/58 11/14/2017 12:11 PM  Temp 36.5 C 11/14/2017 12:11 PM  Pulse 71 11/14/2017 12:11 PM  Resp 23 11/14/2017 12:11 PM  SpO2 100 % 11/14/2017 12:11 PM    Last Pain:  Vitals:   11/14/17 0832  TempSrc: Oral  PainSc:          Complications: No apparent anesthesia complications

## 2017-11-14 NOTE — Discharge Summary (Signed)
OB Discharge Summary     Patient Name: Laura Wiggins DOB: 11-Nov-1985 MRN: [id number]  Date of admission: 11/13/2017 Delivering MD: Homero Fellers, MD  Date of Delivery: 11/14/2017  Date of discharge: 11/16/2017  Admitting diagnosis: no fetal movement 38 wks preg Intrauterine pregnancy: [redacted]w[redacted]d     Secondary diagnosis: None     Discharge diagnosis: Term Pregnancy Delivered, Non reassuring fetal heart tones with repetative deep variable decelerations, failed augmentation of labor.                         Hospital course:  Onset of Labor With Unplanned C/S  32 y.o. yo [id number] at [redacted]w[redacted]d was admitted in Latent Laboron 11/13/2017. Patient had a labor course significant for reaching 10cm, but she had nonreassuring fetal heart tones, baby was occiput posterior. Membrane Rupture Time/Date: 5:15 AM ,11/13/2017   The patient went for cesarean section due to Non-Reassuring FHR, and delivered a Viable infant,11/14/2017  Details of operation can be found in separate operative note. Patient had an uncomplicated postpartum course.  She is ambulating,tolerating a regular diet, passing flatus, and urinating well.  Patient is discharged home in stable condition 11/16/17.                                                                 Post partum procedures:none  Complications: None  Physical exam on 11/16/2017: Vitals:   11/15/17 1933 11/15/17 2343 11/16/17 0735 11/16/17 1541  BP: 120/86 110/68 128/71 109/69  Pulse: 99 (!) 102 92 88  Resp: 20 20 20 20   Temp: 98.2 F (36.8 C)  97.8 F (36.6 C) 98.5 F (36.9 C)  TempSrc: Oral  Oral   SpO2: 99%  98% 99%  Weight:      Height:       General: alert, cooperative and no distress Lochia: appropriate Uterine Fundus: firm Incision: Healing well with no significant drainage DVT Evaluation: No evidence of DVT seen on physical exam.  Labs: Lab Results  Component Value Date   WBC 8.4 11/15/2017   HGB 9.3 (L) 11/15/2017   HCT 27.0 (L) 11/15/2017   MCV  87.3 11/15/2017   PLT 175 11/15/2017   CMP Latest Ref Rng & Units 11/07/2016  Glucose 65 - 99 mg/dL 96  BUN 6 - 20 mg/dL 11  Creatinine 0.44 - 1.00 mg/dL 0.75  Sodium 135 - 145 mmol/L 141  Potassium 3.5 - 5.1 mmol/L 3.7  Chloride 101 - 111 mmol/L 109  CO2 22 - 32 mmol/L 25  Calcium 8.9 - 10.3 mg/dL 9.0    Discharge instruction: per After Visit Summary.  Medications:  Allergies as of 11/16/2017   No Known Allergies     Medication List    STOP taking these medications   Butalbital-APAP-Caffeine 50-325-40 MG capsule     TAKE these medications   buPROPion 300 MG 24 hr tablet Commonly known as:  WELLBUTRIN XL Take 1 tablet (300 mg total) by mouth daily.   CITRANATAL 90 DHA 90-1 & 300 MG Misc Take 1 tablet daily by mouth.   ibuprofen 600 MG tablet Commonly known as:  ADVIL,MOTRIN Take 1 tablet (600 mg total) by mouth every 6 (six) hours as needed for moderate pain.   oxyCODONE-acetaminophen 5-325 MG  tablet Commonly known as:  PERCOCET/ROXICET Take 1 tablet by mouth every 6 (six) hours as needed for up to 5 days for severe pain (pain scale 4-7).       Diet: routine diet  Activity: Advance as tolerated. Pelvic rest for 6 weeks.   Outpatient follow up: Follow-up Information    Schuman, Stefanie Libel, MD. Schedule an appointment as soon as possible for a visit in 1 week(s).   Specialty:  Obstetrics and Gynecology Contact information: [place] [address] [phone]             Postpartum contraception: IUD Rhogam Given postpartum: NA Rubella vaccine given postpartum: Yes Varicella vaccine given postpartum: No TDaP given antepartum or postpartum: Yes  Newborn Data: Live born female  Birth Weight: 6 lb 12.3 oz (3070 g) APGAR: 9, 9  Newborn Delivery   Birth date/time:  11/14/2017 11:04:00 Delivery type:  C-Section, Low Transverse C-section categorization:  Primary      Baby Feeding: Bottle/Breast  Disposition: home with  mother  SIGNED:  Rod Can, [facility] 11/16/2017 6:23 PM

## 2017-11-14 NOTE — Lactation Note (Signed)
This note was copied from a baby's chart. Lactation Consultation Note  Patient Name: Laura Wiggins [id number] Date: 11/14/2017 Reason for consult: Follow-up assessment Baby finally latched to the breast and had a good feed.    Maternal Data Does the patient have breastfeeding experience prior to this delivery?: Yes  Feeding Feeding Type: Breast Fed  LATCH Score Latch: Repeated attempts needed to sustain latch, nipple held in mouth throughout feeding, stimulation needed to elicit sucking reflex.  Audible Swallowing: A few with stimulation  Type of Nipple: Everted at rest and after stimulation  Comfort (Breast/Nipple): Soft / non-tender        Interventions    Lactation Tools Discussed/Used     Consult Status Consult Status: Follow-up    Daryel November 11/14/2017, 4:18 PM

## 2017-11-15 ENCOUNTER — Encounter: Payer: BLUE CROSS/BLUE SHIELD | Admitting: Obstetrics & Gynecology

## 2017-11-15 ENCOUNTER — Encounter: Payer: Self-pay | Admitting: Obstetrics and Gynecology

## 2017-11-15 LAB — CBC
HCT: 27 % — ABNORMAL LOW (ref 35.0–47.0)
Hemoglobin: 9.3 g/dL — ABNORMAL LOW (ref 12.0–16.0)
MCH: 30.1 pg (ref 26.0–34.0)
MCHC: 34.5 g/dL (ref 32.0–36.0)
MCV: 87.3 fL (ref 80.0–100.0)
PLATELETS: 175 10*3/uL (ref 150–440)
RBC: 3.09 MIL/uL — AB (ref 3.80–5.20)
RDW: 16.1 % — AB (ref 11.5–14.5)
WBC: 8.4 10*3/uL (ref 3.6–11.0)

## 2017-11-15 MED ORDER — BUPROPION HCL ER (XL) 300 MG PO TB24
300.0000 mg | ORAL_TABLET | Freq: Every day | ORAL | Status: DC
  Administered 2017-11-15 – 2017-11-16 (×2): 300 mg via ORAL
  Filled 2017-11-15 (×2): qty 1

## 2017-11-15 NOTE — Progress Notes (Signed)
POD#1 pLTCS Subjective:  Sitting up in bed, appears fatigued. Pain control is adequate with PRN medication. Voiding and ambulating without problems. Tolerating a regular diet.  Objective:  Blood pressure 116/60, pulse 95, temperature 98 F (36.7 C), temperature source Oral, resp. rate 18, height 5\' 4"  (1.626 m), weight 240 lb (108.9 kg), last menstrual period 02/16/2017, SpO2 99 %, currently breastfeeding.  General: NAD Pulmonary: no increased work of breathing Abdomen: non-distended, non-tender, fundus firm, lochia appropriate Incision: Dressing C/D/I, On-Q intact Extremities: no edema, no erythema, no tenderness  Results for orders placed or performed during the hospital encounter of 11/13/17 (from the past 72 hour(s))  CBC     Status: Abnormal   Collection Time: 11/13/17  9:09 AM  Result Value Ref Range   WBC 8.0 3.6 - 11.0 K/uL   RBC 3.92 3.80 - 5.20 MIL/uL   Hemoglobin 11.8 (L) 12.0 - 16.0 g/dL   HCT 33.6 (L) 35.0 - 47.0 %   MCV 85.6 80.0 - 100.0 fL   MCH 30.2 26.0 - 34.0 pg   MCHC 35.3 32.0 - 36.0 g/dL   RDW 15.9 (H) 11.5 - 14.5 %   Platelets 212 150 - 440 K/uL    Comment: Performed at [facility], [address] [place], [place] [address]  Type and screen [place]     Status: None   Collection Time: 11/13/17  9:09 AM  Result Value Ref Range   ABO/RH(D) A POS    Antibody Screen NEG    Sample Expiration      11/16/2017 Performed at [facility], [address] [place], [place] [address]   RPR     Status: None   Collection Time: 11/13/17  9:09 AM  Result Value Ref Range   RPR Ser Ql Non Reactive Non Reactive    Comment: (NOTE) Performed At: [facility] [place], [place] [id number] Rush Farmer MD [phone] Performed at [facility], [address] [place], [place] [address]   CBC     Status: Abnormal   Collection Time: 11/15/17  6:06 AM  Result Value Ref Range   WBC 8.4 3.6 -  11.0 K/uL   RBC 3.09 (L) 3.80 - 5.20 MIL/uL   Hemoglobin 9.3 (L) 12.0 - 16.0 g/dL   HCT 27.0 (L) 35.0 - 47.0 %   MCV 87.3 80.0 - 100.0 fL   MCH 30.1 26.0 - 34.0 pg   MCHC 34.5 32.0 - 36.0 g/dL   RDW 16.1 (H) 11.5 - 14.5 %   Platelets 175 150 - 440 K/uL    Comment: Performed at [facility], [address] [place], [place] [address]     Assessment:   32 y.o. [id number] post-operative day # 1 recovering well.  Plan:  1) Acute blood loss anemia - hemodynamically stable and asymptomatic - PO ferrous sulfate  2) Blood Type --/--/A POS (06/12 0086) / Rubella <0.90 (11/02 1034) / Varicella Immune. Rubella vaccine is ordered.  3) TDAP status: given 09/19/17  4) Breastfeeding  5) Contraception: IUD  5) Disposition: continue postpartum care. Anticipate discharge on POD#2-3.  Avel Sensor, CNM 11/15/2017

## 2017-11-15 NOTE — Anesthesia Postprocedure Evaluation (Signed)
Anesthesia Post Note  Patient: Laura Wiggins  Procedure(s) Performed: CESAREAN SECTION (N/A )  Patient location during evaluation: Mother Baby Anesthesia Type: Epidural Level of consciousness: awake and alert and oriented Pain management: pain level not controlled Vital Signs Assessment: post-procedure vital signs reviewed and stable Respiratory status: respiratory function stable Cardiovascular status: stable Postop Assessment: no headache, no backache, epidural receding, no apparent nausea or vomiting, patient able to bend at knees, adequate PO intake and able to ambulate Anesthetic complications: no     Last Vitals:  Vitals:   11/15/17 0407 11/15/17 0429  BP: (!) 84/56 (!) 96/59  Pulse: (!) 102 78  Resp: 20 20  Temp: 36.7 C 36.8 C  SpO2:  98%    Last Pain:  Vitals:   11/15/17 0429  TempSrc: Oral  PainSc: 0-No pain                 Blima Singer

## 2017-11-15 NOTE — Anesthesia Post-op Follow-up Note (Signed)
  Anesthesia Pain Follow-up Note  Patient: Laura Wiggins  Day #: 1  Date of Follow-up: 11/15/2017 Time: 7:17 AM  Last Vitals:  Vitals:   11/15/17 0407 11/15/17 0429  BP: (!) 84/56 (!) 96/59  Pulse: (!) 102 78  Resp: 20 20  Temp: 36.7 C 36.8 C  SpO2:  98%    Level of Consciousness: alert  Pain: mild   Side Effects:None  Catheter Site Exam:clean, dry     Plan: D/C from anesthesia care at surgeon's request  Blima Singer

## 2017-11-15 NOTE — Lactation Note (Signed)
This note was copied from a baby's chart. Assisted with latch on left side in cross cradle hold.  Mom's nipples are flatter but evert easily with stimulation or suction and remain everted.  Infant showing good feeding cues after diaper change, able to latch after a couple of attempts, adjusted mom's hand position so she compresses the nipple to aid latch.  Infant's lips flanged widely and audible swallows noted.  Some stimulation needed to elicit sucking.  Encouraged parents to keep baby actively feeding for 15-20 mins each side.  Pump kit given, mom plans to follow up at [facility] for pump rental.

## 2017-11-16 MED ORDER — OXYCODONE-ACETAMINOPHEN 5-325 MG PO TABS
1.0000 | ORAL_TABLET | Freq: Four times a day (QID) | ORAL | 0 refills | Status: AC | PRN
Start: 2017-11-16 — End: 2017-11-21

## 2017-11-16 MED ORDER — IBUPROFEN 600 MG PO TABS: 600.0000 mg | ORAL_TABLET | Freq: Four times a day (QID) | ORAL | 0 refills | Status: DC | PRN

## 2017-11-16 NOTE — Lactation Note (Signed)
This note was copied from a baby's chart. Lactation Consultation Note  Patient Name: Laura Wiggins [id number] Date: 11/16/2017  Mom has been struggling with breast feeding and is concerned with baby's bilirubin.  She is asking to pump instead and to just give bottles of formula for now to know exactly what [name] is taking in at a feeding.  Explained normal course of lactation and routine newborn feeding patterns.  Reviewed risks of giving lots of bottles early on while she is learning to breast feed. Discussed supply and demand and need to stimulate breasts frequently to bring in mature milk and ensure a plentiful milk supply.  Mom still declines breast feeding but agrees to pump in conjunction with bottlefeeding.  Symphony pump set up in room with instructions on pumping, collection, storage and handling of breast milk.  Mom pumped 14 ml which was given to [name] via bottle.  Lactation name and number written on white board and encouraged to call for questions, concerns or assistance.   Maternal Data    Feeding Feeding Type: Bottle Fed - Breast Milk Nipple Type: Slow - flow  LATCH Score                   Interventions    Lactation Tools Discussed/Used     Consult Status      Jarold Motto 11/16/2017, 6:07 PM

## 2017-11-16 NOTE — Progress Notes (Signed)
From 9:15 AM visit this morning   Subjective:   Post Op Day 2 Patient is doing well. She is ambulating and voiding without difficulty. She is tolerating PO intake and her pain is well controlled with PO medications. She is breastfeeding and supplementing with formula while the baby is under bili lights.  Objective:  Blood pressure 109/69, pulse 88, temperature 98.5 F (36.9 C), resp. rate 20, height 5\' 4"  (1.626 m), weight 240 lb (108.9 kg), last menstrual period 02/16/2017, SpO2 99 %, currently breastfeeding.  General: NAD Pulmonary: no increased work of breathing Abdomen: non-distended, non-tender, fundus firm at level of umbilicus Incision: honeycomb dressing is C/D/I, On Q pump is intact Extremities: no edema, no erythema, no tenderness   Intake/Output Summary (Last 24 hours) at 11/16/2017 1814 Last data filed at 11/16/2017 1340 Gross per 24 hour  Intake 120 ml  Output -  Net 120 ml     Assessment:   32 y.o. [id number] postoperativeday # 2   Plan:  1) Acute blood loss anemia - hemodynamically stable and asymptomatic - po ferrous sulfate  2) A positive, Rubella Not Immune, Varicella Immune  3) TDAP status: given antepartum  4) Breast/Bottle/Contraception: IUD  5) Disposition: possible discharge to home later today if baby is discharged  Rod Can, CNM

## 2017-11-16 NOTE — Discharge Instructions (Signed)
Please call your doctor or return to the ER if you experience any chest pains, shortness of breath, dizziness, visual changes, fever greater than 101, any heavy bleeding (saturating more than 1 pad per hour), large clots, or foul smelling discharge, any worsening abdominal pain and cramping that is not controlled by pain medication, or any signs of postpartum depression. No tampons, enemas, douches, or sexual intercourse for 6 weeks. Also avoid tub baths, hot tubs, or swimming for 6 weeks.  ° ° °Check your incision daily for any signs of infection such as redness, warmth, swelling, increased pain, or pus/foul smelling drainage ° ° °Activity: do not lift over 10 lbs for 6 weeks  °No driving for 2 weeks  °Pelvic rest for 6 weeks  °

## 2017-11-16 NOTE — Progress Notes (Signed)
MMR vaccine given prior to discharge.    Hilbert Bible, RN

## 2017-11-16 NOTE — Progress Notes (Signed)
pt discharged home with infant.  Discharge instructions, prescriptions and follow up appointment given to and reviewed with pt.  Pt verbalized understanding, all questions answered.  Escorted by auxiliary. 

## 2017-11-19 ENCOUNTER — Telehealth: Payer: Self-pay

## 2017-11-19 NOTE — Telephone Encounter (Signed)
FMLA/DISABILITY form for [employer] filled out, given to [place] (front desk) to obtain signature and give to [place] for processing.

## 2017-11-22 ENCOUNTER — Encounter: Payer: BLUE CROSS/BLUE SHIELD | Admitting: Obstetrics & Gynecology

## 2017-11-25 ENCOUNTER — Encounter: Payer: Self-pay | Admitting: Obstetrics and Gynecology

## 2017-11-25 ENCOUNTER — Ambulatory Visit (INDEPENDENT_AMBULATORY_CARE_PROVIDER_SITE_OTHER): Payer: BLUE CROSS/BLUE SHIELD | Admitting: Obstetrics and Gynecology

## 2017-11-25 VITALS — BP 106/76 | HR 91 | Wt 218.0 lb

## 2017-11-25 DIAGNOSIS — Z98891 History of uterine scar from previous surgery: Secondary | ICD-10-CM

## 2017-11-25 DIAGNOSIS — D62 Acute posthemorrhagic anemia: Secondary | ICD-10-CM

## 2017-11-25 DIAGNOSIS — O0990 Supervision of high risk pregnancy, unspecified, unspecified trimester: Secondary | ICD-10-CM

## 2017-11-25 MED ORDER — DOCUSATE SODIUM 100 MG PO CAPS: 100.0000 mg | ORAL_CAPSULE | Freq: Two times a day (BID) | ORAL | 2 refills | Status: DC | PRN

## 2017-11-25 MED ORDER — FERROUS SULFATE 325 (65 FE) MG PO TABS: 325.0000 mg | ORAL_TABLET | Freq: Two times a day (BID) | ORAL | 1 refills | Status: DC

## 2017-11-25 NOTE — Progress Notes (Signed)
  OBSTETRICS POSTPARTUM CLINIC PROGRESS NOTE  Subjective:     Laura Wiggins is a 32 y.o. G79P2012 female who presents for a postpartum visit. She is 2 weeks postpartum following a Term pregnancy and delivery by C-section.  I have fully reviewed the prenatal and intrapartum course. Anesthesia: epidural.  Postpartum course has been complicated by uncomplicated.  Baby is feeding by Bottle and Breast.  Bleeding: patient has not  resumed menses. She is still having bleeding on and off. She reports she is saturating pads and passing small clots. Some days are heavy and some are lighter.  Bowel function is normal. Bladder function is normal.  Patient is not sexually active.  Postpartum depression screening: negative.   The following portions of the patient's history were reviewed and updated as appropriate: allergies, current medications, past family history, past medical history, past social history, past surgical history and problem list.  Review of Systems Pertinent items are noted in HPI.  Objective:    BP 106/76 (BP Location: Left Arm, Patient Position: Sitting, Cuff Size: Large)   Pulse 91   Wt 218 lb (98.9 kg)   Breastfeeding? Yes   BMI 37.42 kg/m   General:  alert and no distress   Breasts:  inspection negative, no nipple discharge or bleeding, no masses or nodularity palpable  Lungs: clear to auscultation bilaterally  Heart:  regular rate and rhythm, S1, S2 normal, no murmur, click, rub or gallop  Abdomen: soft, non-tender; bowel sounds normal; no masses,  no organomegaly.   Well healed Pfannenstiel incision   Vulva:  normal  Vagina: normal vagina, no discharge, exudate, lesion, or erythema  Cervix:  no cervical motion tenderness and no lesions  Corpus: normal size, contour, position, consistency, mobility, non-tender  Adnexa:  normal adnexa and no mass, fullness, tenderness  Rectal Exam: Not performed.          Assessment:  Post Partum Care visit 1. Anemia due to acute  blood loss - ferrous sulfate (FERROUSUL) 325 (65 FE) MG tablet; Take 1 tablet (325 mg total) by mouth 2 (two) times daily.  Dispense: 60 tablet; Refill: 1 - docusate sodium (COLACE) 100 MG capsule; Take 1 capsule (100 mg total) by mouth 2 (two) times daily as needed.  Dispense: 30 capsule; Refill: 2  2. S/P cesarean section Healing well   Plan:  See orders and Patient Instructions.   Will start ferrous sulfate for anemia. Discussed iron supplementation during pregnancy. Reccommended that patient take iron supplements two times a day. Discussed Iron rich diet and foods such as broccoli, bean, whole grains like oatmeal, dark-green-leafy vegetables, molasses, red meat, and liver. Discussed taking iron supplementation with a source of vitamin C such as oranges, kiwi, grapefruit, or a supplement.  Patient having difficulty with supply despite regular breast feeding/pumping and hydration. She is pumping at night as well. Given information for the lactation consultants and breastfeeding support groups. Patient will contact us if her bleeding does not lessen in the next 2 weeks.   Follow up in: 4 weeks or as needed.   Adrian Prows MD [facility], [facility] 11/25/17 5:34 PM

## 2017-11-27 ENCOUNTER — Encounter: Payer: Self-pay | Admitting: Obstetrics & Gynecology

## 2017-12-24 ENCOUNTER — Other Ambulatory Visit (HOSPITAL_COMMUNITY)
Admission: RE | Admit: 2017-12-24 | Discharge: 2017-12-24 | Disposition: A | Discharge status: Home / Self Care | Payer: BLUE CROSS/BLUE SHIELD | Source: Ambulatory Visit | Attending: Obstetrics and Gynecology | Admitting: Obstetrics and Gynecology

## 2017-12-24 ENCOUNTER — Ambulatory Visit (INDEPENDENT_AMBULATORY_CARE_PROVIDER_SITE_OTHER): Payer: BLUE CROSS/BLUE SHIELD | Admitting: Obstetrics and Gynecology

## 2017-12-24 ENCOUNTER — Encounter: Payer: Self-pay | Admitting: Obstetrics and Gynecology

## 2017-12-24 VITALS — BP 110/70 | HR 89 | Ht 65.0 in | Wt 217.0 lb

## 2017-12-24 DIAGNOSIS — Z124 Encounter for screening for malignant neoplasm of cervix: Secondary | ICD-10-CM

## 2017-12-24 DIAGNOSIS — Z3009 Encounter for other general counseling and advice on contraception: Secondary | ICD-10-CM

## 2017-12-24 DIAGNOSIS — N870 Mild cervical dysplasia: Secondary | ICD-10-CM | POA: Diagnosis not present

## 2017-12-24 MED ORDER — DESOGESTREL-ETHINYL ESTRADIOL 0.15-30 MG-MCG PO TABS: 1.0000 | ORAL_TABLET | Freq: Every day | ORAL | 11 refills | Status: DC

## 2017-12-24 NOTE — Progress Notes (Signed)
  OBSTETRICS POSTPARTUM CLINIC PROGRESS NOTE  Subjective:     Laura Wiggins is a 32 y.o. G26P2012 female who presents for a postpartum visit. She is 6 weeks postpartum following a Term pregnancy and delivery by C-section.  I have fully reviewed the prenatal and intrapartum course. Anesthesia: epidural.  Postpartum course has been complicated by uncomplicated.  Baby is feeding by Bottle.  Bleeding: patient has not  resumed menses.  Bowel function is normal. Bladder function is normal.  Patient is not sexually active. Contraception method desired is OCP (estrogen/progesterone).  Postpartum depression screening: negative.   The following portions of the patient's history were reviewed and updated as appropriate: allergies, current medications, past family history, past medical history, past social history, past surgical history and problem list.  Review of Systems A comprehensive review of systems was negative.  Objective:    BP 110/70   Pulse 89   Ht 5\' 5"  (1.651 m)   Wt 217 lb (98.4 kg)   Breastfeeding? No   BMI 36.11 kg/m   General:  alert and no distress   Breasts:  inspection negative, no nipple discharge or bleeding, no masses or nodularity palpable  Lungs: clear to auscultation bilaterally  Heart:  regular rate and rhythm, S1, S2 normal, no murmur, click, rub or gallop  Abdomen: soft, non-tender; bowel sounds normal; no masses,  no organomegaly.   Well healed Pfannenstiel incision   Vulva:  normal  Vagina: normal vagina, no discharge, exudate, lesion, or erythema  Cervix:  no cervical motion tenderness and no lesions  Corpus: normal size, contour, position, consistency, mobility, non-tender  Adnexa:  normal adnexa and no mass, fullness, tenderness  Rectal Exam: Not performed.          Assessment:  Post Partum Care visit 1. Cervical cancer screening   - Cytology - PAP   Plan:  See orders and Patient Instructions May return to work. Has stopped breast feeding,  desires oral contraceptive pill.  Recommended 12 months at a minimum between pregnancies. Is considering a third child.  Is interested in weight loss assistance. Discussed dietary modifications. Recommended weight watchers. Discussed that Dr. Georgianne Fick was also available for medical assisted weight loss.    Follow up in: 1 year or as needed.   Adrian Prows MD [facility], [facility] 12/24/17 4:27 PM

## 2017-12-27 ENCOUNTER — Encounter: Payer: Self-pay | Admitting: Obstetrics and Gynecology

## 2017-12-27 LAB — CYTOLOGY - PAP
HPV 16/18/45 GENOTYPING: NEGATIVE
HPV: DETECTED — AB

## 2017-12-27 NOTE — Progress Notes (Signed)
No answer, left message asking patient to check mychart and call office to schedule follow up, LSIL HPV+, needs colposcopy.

## 2017-12-27 NOTE — Progress Notes (Signed)
Please call patient, she will need a colposcopy. Thank you,  Dr. Gilman Schmidt

## 2018-01-13 ENCOUNTER — Ambulatory Visit (INDEPENDENT_AMBULATORY_CARE_PROVIDER_SITE_OTHER): Payer: BLUE CROSS/BLUE SHIELD | Admitting: Obstetrics and Gynecology

## 2018-01-13 ENCOUNTER — Encounter: Payer: Self-pay | Admitting: Obstetrics and Gynecology

## 2018-01-13 VITALS — BP 100/60 | Ht 65.0 in | Wt 216.0 lb

## 2018-01-13 DIAGNOSIS — R87612 Low grade squamous intraepithelial lesion on cytologic smear of cervix (LGSIL): Secondary | ICD-10-CM | POA: Diagnosis not present

## 2018-01-13 NOTE — Progress Notes (Signed)
   GYNECOLOGY CLINIC COLPOSCOPY PROCEDURE NOTE  32 y.o. [id number] here for colposcopy for low-grade squamous intraepithelial neoplasia (LGSIL - encompassing HPV,mild dysplasia,CIN I)  pap smear on 12/24/17. Discussed underlying role for HPV infection in the development of cervical dysplasia, its natural history and progression/regression, need for surveillance.  Is the patient  pregnant: No LMP: Patient's last menstrual period was 12/31/2017. Smoking status:  reports that she has quit smoking. She has never used smokeless tobacco. Contraception: OCP (estrogen/progesterone) Future fertility desired:  Yes  Patient given informed consent, signed copy in the chart, time out was performed.  The patient was position in dorsal lithotomy position. Speculum was placed the cervix was visualized.   After application of acetic acid colposcopic inspection of the cervix was undertaken.   Colposcopy adequate, full visualization of transformation zone: Yes acetowhite lesion(s) noted at 12,8 o'clock and mosaicism noted at 10and 4 o'clock; corresponding biopsies obtained.   ECC specimen obtained:  Yes  All specimens were labeled and sent to pathology.   Patient was given post procedure instructions.  Will follow up pathology and manage accordingly.  Routine preventative health maintenance measures emphasized. She has a history of a LEEP at 1. If LEEP or cone is needed would likely have to be done in the OR because of her shortened cervix.   Physical Exam  Genitourinary:      Adrian Prows MD [facility], [facility] 01/13/18 4:07 PM

## 2018-01-16 LAB — PATHOLOGY

## 2018-01-16 NOTE — Progress Notes (Signed)
Discussed with patient on phone and released to mychart. Follow up in 1 year.

## 2018-01-17 ENCOUNTER — Ambulatory Visit (INDEPENDENT_AMBULATORY_CARE_PROVIDER_SITE_OTHER): Payer: BLUE CROSS/BLUE SHIELD | Admitting: Obstetrics and Gynecology

## 2018-01-17 ENCOUNTER — Encounter: Payer: Self-pay | Admitting: Obstetrics and Gynecology

## 2018-01-17 VITALS — BP 106/68 | HR 80 | Ht 65.0 in | Wt 215.0 lb

## 2018-01-17 DIAGNOSIS — N888 Other specified noninflammatory disorders of cervix uteri: Secondary | ICD-10-CM

## 2018-01-17 NOTE — Telephone Encounter (Signed)
Please call this patient and find a time that she can be added on to my schedule today. I am okay with double booking.  Thank you, Dr. Gilman Schmidt

## 2018-01-17 NOTE — Progress Notes (Signed)
Patient ID: Laura Wiggins, female   DOB: [birth year], 32 y.o.   MRN: [id number]  Reason for Consult: Follow-up (Bleeding heavy since monday when colpo happened with cramping )   Referred by Coral Spikes, DO  Subjective:     HPI:  Laura Wiggins is a 32 y.o. female. She reports heavy bleeding since Monday after colposcopy.   Past Medical History:  Diagnosis Date  . Cancer ([place])    Melanoma  . Chicken pox   . Depression   . Endometriosis 2004  . Frequent headaches   . Hx of migraines   . Interstitial cystitis 2004  . Kidney stones   . Urinary incontinence    Family History  Problem Relation Age of Onset  . Hyperlipidemia Mother   . Hypertension Mother   . Diabetes Mother    Past Surgical History:  Procedure Laterality Date  . CESAREAN SECTION N/A 11/14/2017   Procedure: CESAREAN SECTION;  Surgeon: Homero Fellers, MD;  Location: [facility] ORS;  Service: Obstetrics;  Laterality: N/A;  . TONSILLECTOMY AND ADENOIDECTOMY  2013    Short Social History:  Social History   Tobacco Use  . Smoking status: Former Research scientist (life sciences)  . Smokeless tobacco: Never Used  Substance Use Topics  . Alcohol use: Yes    Alcohol/week: 0.0 standard drinks    Comment: occasioanlly    No Known Allergies  Current Outpatient Medications  Medication Sig Dispense Refill  . buPROPion (WELLBUTRIN XL) 300 MG 24 hr tablet Take 1 tablet (300 mg total) by mouth daily. 90 tablet 3  . desogestrel-ethinyl estradiol (APRI) 0.15-30 MG-MCG tablet Take 1 tablet by mouth daily. 1 Package 11  . Multiple Vitamin (MULTIVITAMIN) capsule Take 1 capsule by mouth daily.    . SUMAtriptan (IMITREX) 25 MG tablet TAKE 1 TABLET (25 MG TOTAL) BY MOUTH ONCE. MAY REPEAT IN 2 HOURS IF HEADACHE PERSISTS OR RECURS.     No current facility-administered medications for this visit.     Review of Systems  Constitutional: Negative for chills, fatigue, fever and unexpected weight change.  HENT: Negative for trouble swallowing.   Eyes: Negative for loss of vision.  Respiratory: Negative for cough, shortness of breath and wheezing.  Cardiovascular: Negative for chest pain, leg swelling, palpitations and syncope.  GI: Negative for abdominal pain, blood in stool, diarrhea, nausea and vomiting.  GU: Negative for difficulty urinating, dysuria, frequency and hematuria.  Musculoskeletal: Negative for back pain, leg pain and joint pain.  Skin: Negative for rash.  Neurological: Negative for dizziness, headaches, light-headedness, numbness and seizures.  Psychiatric: Negative for behavioral problem, confusion, depressed mood and sleep disturbance.        Objective:  Objective   Vitals:   01/17/18 1508  BP: 106/68  Pulse: 80  Weight: 215 lb (97.5 kg)  Height: 5\' 5"  (1.651 m)   Body mass index is 35.78 kg/m.  Physical Exam  Constitutional: She is oriented to person, place, and time. She appears well-developed and well-nourished.  HENT:  Head: Normocephalic and atraumatic.  Eyes: Pupils are equal, round, and reactive to light. EOM are normal.  Cardiovascular: Normal rate and regular rhythm.  Pulmonary/Chest: Effort normal. No respiratory distress.  Neurological: She is alert and oriented to person, place, and time.  Skin: Skin is warm and dry.  Psychiatric: She has a normal mood and affect. Her behavior is normal. Judgment and thought content normal.  Nursing note and vitals reviewed.      Assessment/Plan:    32  yo with bleeding after colpscopy Reassurance, silver nitrate applied     Adrian Prows MD [place] 01/17/18 3:14 PM

## 2018-01-31 ENCOUNTER — Encounter: Payer: Self-pay | Admitting: Obstetrics & Gynecology

## 2018-01-31 NOTE — Telephone Encounter (Signed)
Please advise 

## 2018-03-26 ENCOUNTER — Other Ambulatory Visit: Payer: Self-pay | Admitting: Obstetrics & Gynecology

## 2019-07-29 DIAGNOSIS — U071 COVID-19: Secondary | ICD-10-CM

## 2019-07-29 HISTORY — DX: COVID-19: U07.1

## 2019-08-18 ENCOUNTER — Other Ambulatory Visit: Payer: Self-pay

## 2019-08-18 ENCOUNTER — Encounter: Payer: Self-pay | Admitting: Emergency Medicine

## 2019-08-18 DIAGNOSIS — N23 Unspecified renal colic: Secondary | ICD-10-CM | POA: Insufficient documentation

## 2019-08-18 DIAGNOSIS — N200 Calculus of kidney: Secondary | ICD-10-CM | POA: Insufficient documentation

## 2019-08-18 DIAGNOSIS — R319 Hematuria, unspecified: Secondary | ICD-10-CM | POA: Insufficient documentation

## 2019-08-18 DIAGNOSIS — Z8616 Personal history of COVID-19: Secondary | ICD-10-CM | POA: Insufficient documentation

## 2019-08-18 DIAGNOSIS — R519 Headache, unspecified: Secondary | ICD-10-CM | POA: Insufficient documentation

## 2019-08-18 DIAGNOSIS — R112 Nausea with vomiting, unspecified: Secondary | ICD-10-CM | POA: Insufficient documentation

## 2019-08-18 LAB — CBC WITH DIFFERENTIAL/PLATELET
Abs Immature Granulocytes: 0.02 10*3/uL (ref 0.00–0.07)
Basophils Absolute: 0 10*3/uL (ref 0.0–0.1)
Basophils Relative: 1 %
Eosinophils Absolute: 0.1 10*3/uL (ref 0.0–0.5)
Eosinophils Relative: 2 %
HCT: 35.6 % — ABNORMAL LOW (ref 36.0–46.0)
Hemoglobin: 11.7 g/dL — ABNORMAL LOW (ref 12.0–15.0)
Immature Granulocytes: 0 %
Lymphocytes Relative: 21 %
Lymphs Abs: 1.3 10*3/uL (ref 0.7–4.0)
MCH: 29.3 pg (ref 26.0–34.0)
MCHC: 32.9 g/dL (ref 30.0–36.0)
MCV: 89 fL (ref 80.0–100.0)
Monocytes Absolute: 0.6 10*3/uL (ref 0.1–1.0)
Monocytes Relative: 9 %
Neutro Abs: 4.2 10*3/uL (ref 1.7–7.7)
Neutrophils Relative %: 67 %
Platelets: 228 10*3/uL (ref 150–400)
RBC: 4 MIL/uL (ref 3.87–5.11)
RDW: 12.5 % (ref 11.5–15.5)
WBC: 6.3 10*3/uL (ref 4.0–10.5)
nRBC: 0 % (ref 0.0–0.2)

## 2019-08-18 LAB — COMPREHENSIVE METABOLIC PANEL
ALT: 15 U/L (ref 0–44)
AST: 16 U/L (ref 15–41)
Albumin: 4 g/dL (ref 3.5–5.0)
Alkaline Phosphatase: 30 U/L — ABNORMAL LOW (ref 38–126)
Anion gap: 9 (ref 5–15)
BUN: 17 mg/dL (ref 6–20)
CO2: 23 mmol/L (ref 22–32)
Calcium: 9 mg/dL (ref 8.9–10.3)
Chloride: 108 mmol/L (ref 98–111)
Creatinine, Ser: 1.07 mg/dL — ABNORMAL HIGH (ref 0.44–1.00)
GFR calc Af Amer: >60 mL/min (ref >60)
GFR calc non Af Amer: >60 mL/min (ref >60)
Glucose, Bld: 121 mg/dL — ABNORMAL HIGH (ref 70–99)
Potassium: 3.3 mmol/L — ABNORMAL LOW (ref 3.5–5.1)
Sodium: 140 mmol/L (ref 135–145)
Total Bilirubin: 0.5 mg/dL (ref 0.3–1.2)
Total Protein: 6.7 g/dL (ref 6.5–8.1)

## 2019-08-18 LAB — URINALYSIS, COMPLETE (UACMP) WITH MICROSCOPIC
Bilirubin Urine: NEGATIVE
Glucose, UA: NEGATIVE mg/dL
Ketones, ur: NEGATIVE mg/dL
Leukocytes,Ua: NEGATIVE
Nitrite: NEGATIVE
Protein, ur: NEGATIVE mg/dL
Specific Gravity, Urine: 1.002 — ABNORMAL LOW (ref 1.005–1.030)
pH: 6 (ref 5.0–8.0)

## 2019-08-18 LAB — POCT PREGNANCY, URINE: Preg Test, Ur: NEGATIVE

## 2019-08-18 LAB — LIPASE, BLOOD: Lipase: 34 U/L (ref 11–51)

## 2019-08-18 NOTE — ED Triage Notes (Signed)
Patient ambulatory to triage with steady gait, without difficulty or distress noted, mask in place; pt reports yesterday began having lower back pain accomp by HA, N/V, and hematuria

## 2019-08-19 ENCOUNTER — Emergency Department
Admission: EM | Admit: 2019-08-19 | Discharge: 2019-08-19 | Disposition: A | Discharge status: Home / Self Care | Payer: Self-pay | Attending: Emergency Medicine | Admitting: Emergency Medicine

## 2019-08-19 ENCOUNTER — Encounter: Payer: Self-pay | Admitting: Emergency Medicine

## 2019-08-19 ENCOUNTER — Emergency Department: Payer: Self-pay

## 2019-08-19 DIAGNOSIS — R519 Headache, unspecified: Secondary | ICD-10-CM

## 2019-08-19 DIAGNOSIS — N23 Unspecified renal colic: Secondary | ICD-10-CM

## 2019-08-19 MED ORDER — IBUPROFEN 600 MG PO TABS
600.0000 mg | ORAL_TABLET | Freq: Once | ORAL | Status: AC
  Administered 2019-08-19: 04:00:00 600 mg via ORAL
  Filled 2019-08-19: qty 1

## 2019-08-19 MED ORDER — BUTALBITAL-APAP-CAFFEINE 50-325-40 MG PO TABS: 1.0000 | ORAL_TABLET | Freq: Four times a day (QID) | ORAL | 0 refills | Status: DC | PRN

## 2019-08-19 NOTE — ED Provider Notes (Signed)
[facility] Emergency Department Provider Note  ____________________________________________   First MD Initiated Contact with Patient 08/19/19 [phone]     (approximate)  I have reviewed the triage vital signs and the nursing notes.   HISTORY  Chief Complaint Back Pain    HPI RONEE RISHER is a 34 y.o. female with medical history as listed below which includes a history of kidney stones, interstitial cystitis, and a relatively recent diagnosis of COVID-19 within the last month.  She presents tonight for evaluation of acute onset sharp severe right-sided low back pain that started yesterday.  Is better now but was severe initially and accompanied with nausea and vomiting.  She also noticed blood in her urine but no dysuria.  It feels similar to when she had kidney stones in the past.  She also has a generalized headache but has been having headaches since she was diagnosed with COVID-19.  She denies recent fever, sore throat, chest pain, shortness of breath, and abdominal pain.  She said that her COVID-19 symptoms have resolved within the last week or so other than the persistent headaches.  Nothing in particular makes the symptoms better or worse.         Past Medical History:  Diagnosis Date  . Cancer ([place])    Melanoma  . Chicken pox   . COVID-19 07/29/2019   diagnosed 07/29/2019 according to the patient  . Depression   . Endometriosis 2004  . Frequent headaches   . Hx of migraines   . Interstitial cystitis 2004  . Kidney stones   . Urinary incontinence     Patient Active Problem List   Diagnosis Date Noted  . Postpartum care following cesarean delivery 11/16/2017  . PROM (premature rupture of membranes) 11/13/2017  . Supervision of high risk pregnancy, antepartum 04/05/2017  . History of preterm delivery, currently pregnant 04/05/2017  . Obesity affecting pregnancy 04/05/2017  . BMI 35.0-35.9,adult 04/05/2017  . GBS bacteriuria 04/05/2017  .  Chest pain 10/22/2016  . Lower extremity edema 09/11/2016  . 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 Depression 12/05/2015  . Obesity 02/25/2015    Past Surgical History:  Procedure Laterality Date  . CESAREAN SECTION N/A 11/14/2017   Procedure: CESAREAN SECTION;  Surgeon: Homero Fellers, MD;  Location: [facility] ORS;  Service: Obstetrics;  Laterality: N/A;  . TONSILLECTOMY AND ADENOIDECTOMY  2013    Prior to Admission medications   Medication Sig Start Date End Date Taking? Authorizing Provider  buPROPion (WELLBUTRIN XL) 300 MG 24 hr tablet TAKE 1 TABLET BY MOUTH  DAILY 03/27/18   Schuman, Stefanie Libel, MD  butalbital-acetaminophen-caffeine (FIORICET) 50-325-40 MG tablet Take 1-2 tablets by mouth every 6 (six) hours as needed for headache. 08/19/19 08/18/20  Hinda Kehr, MD  desogestrel-ethinyl estradiol (APRI) 0.15-30 MG-MCG tablet Take 1 tablet by mouth daily. 12/24/17   Homero Fellers, MD  Multiple Vitamin (MULTIVITAMIN) capsule Take 1 capsule by mouth daily.    [provider]  SUMAtriptan (IMITREX) 25 MG tablet TAKE 1 TABLET (25 MG TOTAL) BY MOUTH ONCE. MAY REPEAT IN 2 HOURS IF HEADACHE PERSISTS OR RECURS. 10/07/16   [provider]    Allergies Patient has no known allergies.  Family History  Problem Relation Age of Onset  . Hyperlipidemia Mother   . Hypertension Mother   . Diabetes Mother     Social History Social History   Tobacco Use  . Smoking status: Former Research scientist (life sciences)  . Smokeless tobacco: Never Used  Substance Use  Topics  . Alcohol use: Yes    Alcohol/week: 0.0 standard drinks    Comment: occasioanlly  . Drug use: No    Review of Systems Constitutional: No fever/chills Eyes: No visual changes. ENT: No sore throat. Cardiovascular: Denies chest pain. Respiratory: Denies shortness of breath. Gastrointestinal: Nausea and vomiting associated with the acute onset right lower back pain.  No abdominal pain.  No diarrhea.  No constipation.  Genitourinary: Hematuria without dysuria. Musculoskeletal: Right-sided low back pain as described above. Integumentary: Negative for rash. Neurological: Generalized headaches for a few weeks.  No focal weakness or numbness.   ____________________________________________   PHYSICAL EXAM:  VITAL SIGNS: ED Triage Vitals  Enc Vitals Group     BP 08/18/19 2251 120/70     Pulse Rate 08/18/19 2251 80     Resp 08/18/19 2251 19     Temp 08/18/19 2251 98.2 F (36.8 C)     Temp Source 08/18/19 2251 Oral     SpO2 08/18/19 2251 100 %     Weight 08/18/19 2244 90.7 kg (200 lb)     Height 08/18/19 2244 1.651 m (5\' 5" )     Head Circumference --      Peak Flow --      Pain Score 08/18/19 2244 6     Pain Loc --      Pain Edu? --      Excl. in [place]? --     Constitutional: Alert and oriented. Appears uncomfortable but not in severe distress. Eyes: Conjunctivae are normal.  Head: Atraumatic. Nose: No congestion/rhinnorhea. Mouth/Throat: Patient is wearing a mask. Neck: No stridor.  No meningeal signs.   Cardiovascular: Normal rate, regular rhythm. Good peripheral circulation. Grossly normal heart sounds. Respiratory: Normal respiratory effort.  No retractions. Gastrointestinal: Soft and nontender. No distention.  Musculoskeletal: Mild right CVA tenderness to percussion.  No lower extremity tenderness nor edema. No gross deformities of extremities. Neurologic:  Normal speech and language. No gross focal neurologic deficits are appreciated.  Skin:  Skin is warm, dry and intact. Psychiatric: Mood and affect are normal. Speech and behavior are normal.  ____________________________________________   LABS (all labs ordered are listed, but only abnormal results are displayed)  Labs Reviewed  CBC WITH DIFFERENTIAL/PLATELET - Abnormal; Notable for the following components:      Result Value   Hemoglobin 11.7 (*)    HCT 35.6 (*)    All other components within normal limits  COMPREHENSIVE  METABOLIC PANEL - Abnormal; Notable for the following components:   Potassium 3.3 (*)    Glucose, Bld 121 (*)    Creatinine, Ser 1.07 (*)    Alkaline Phosphatase 30 (*)    All other components within normal limits  URINALYSIS, COMPLETE (UACMP) WITH MICROSCOPIC - Abnormal; Notable for the following components:   Color, Urine STRAW (*)    APPearance CLEAR (*)    Specific Gravity, Urine 1.002 (*)    Hgb urine dipstick LARGE (*)    Bacteria, UA RARE (*)    All other components within normal limits  LIPASE, BLOOD  POCT PREGNANCY, URINE   ____________________________________________  EKG  No indication for emergent EKG ____________________________________________  RADIOLOGY Ursula Alert, personally viewed and evaluated these images (plain radiographs) as part of my medical decision making, as well as reviewing the written report by the radiologist.  ED MD interpretation:  No emergent abnormalities.  Patient has a 2-mm non-obstructive renal stone on the left.  Official radiology report(s): CT Renal Stone Study  Result Date: 08/19/2019 CLINICAL DATA:  34 year old female with flank pain. Concern for kidney stone. EXAM: CT ABDOMEN AND PELVIS WITHOUT CONTRAST TECHNIQUE: Multidetector CT imaging of the abdomen and pelvis was performed following the standard protocol without IV contrast. COMPARISON:  None. FINDINGS: Evaluation of this exam is limited in the absence of intravenous contrast. Lower chest: The visualized lung bases are clear. No intra-abdominal free air or free fluid. Hepatobiliary: No focal liver abnormality is seen. No gallstones, gallbladder wall thickening, or biliary dilatation. Pancreas: Unremarkable. No pancreatic ductal dilatation or surrounding inflammatory changes. Spleen: Normal in size without focal abnormality. Adrenals/Urinary Tract: The adrenal glands are unremarkable. There is a 2 mm nonobstructing left renal inferior pole calculus. No hydronephrosis. The right  kidney is unremarkable. The visualized ureters and urinary bladder appear unremarkable as well. Stomach/Bowel: There is no bowel obstruction or active inflammation. The appendix is normal. Vascular/Lymphatic: The abdominal aorta and IVC are grossly unremarkable on this noncontrast CT. No portal venous gas. There is no adenopathy. Reproductive: The uterus and ovaries are grossly unremarkable. Other: None Musculoskeletal: No acute or significant osseous findings. IMPRESSION: 1. A 2 mm nonobstructing left renal inferior pole calculus. No hydronephrosis. 2. No bowel obstruction. Normal appendix. Electronically Signed   By: Anner Crete M.D.   On: 08/19/2019 02:55    ____________________________________________   PROCEDURES   Procedure(s) performed (including Critical Care):  Procedures   ____________________________________________   INITIAL IMPRESSION / MDM / [place] / ED COURSE  As part of my medical decision making, I reviewed the following data within the [place] notes reviewed and incorporated, Labs reviewed , Old chart reviewed, Notes from prior ED visits and [place] Controlled Substance Database   Differential diagnosis includes, but is not limited to, ureteral colic, UTI/pyelonephritis, musculoskeletal strain, less likely vertebral or pelvic injury.  Patient is generally well-appearing in spite of some discomfort.  She reported seeing gross hematuria which is resolved but she still has hemoglobin in her urine and no evidence of infection.  CMP and CBC are within normal limits.  Vital signs are normal and she is afebrile.  Patient's physical exam is reassuring with no abdominal tenderness to palpation and only mild right CVA tenderness.  I believe that she most likely has passed a ureteral stone is having some residual pain.  She is comfortable with the plan for a CT renal stone protocol and I will reassess.      Clinical Course as of Aug 19 330   Wed Aug 19, 2019  0326 Patient has a small nonobstructive stone on the left kidney and no obvious ureteral stone on the right but I think it is likely that she passed a stone as described above.  There is no evidence of an acute or emergent medical condition.  I will discharge her with a prescription for Fioricet for her ongoing headaches and I gave my usual and customary return precautions.  She understands and agrees with the plan.   [CF]  0331 I am also giving a dose of ibuprofen 600 mg by mouth prior to discharge.   [CF]    Clinical Course User Index [CF] Hinda Kehr, MD     ____________________________________________  FINAL CLINICAL IMPRESSION(S) / ED DIAGNOSES  Final diagnoses:  Renal colic on right side  Nonintractable episodic headache, unspecified headache type     MEDICATIONS GIVEN DURING THIS VISIT:  Medications  ibuprofen (ADVIL) tablet 600 mg (has no administration in time range)  ED Discharge Orders         Ordered    butalbital-acetaminophen-caffeine (FIORICET) 50-325-40 MG tablet  Every 6 hours PRN     08/19/19 0328          *Please note:  YUKARI CELENTANO was evaluated in Emergency Department on 08/19/2019 for the symptoms described in the history of present illness. She was evaluated in the context of the global COVID-19 pandemic, which necessitated consideration that the patient might be at risk for infection with the SARS-CoV-2 virus that causes COVID-19. Institutional protocols and algorithms that pertain to the evaluation of patients at risk for COVID-19 are in a state of rapid change based on information released by regulatory bodies including the CDC and federal and state organizations. These policies and algorithms were followed during the patient's care in the ED.  Some ED evaluations and interventions may be delayed as a result of limited staffing during the pandemic.*  Note:  This document was prepared using Dragon voice recognition software and  may include unintentional dictation errors.   Hinda Kehr, MD 08/19/19 [phone]

## 2019-08-19 NOTE — Discharge Instructions (Signed)
As we discussed, I think it is most likely that you passed a small kidney stone which caused the acute pain yesterday and is still somewhat painful today but is improving.  He has no evidence of an infection at this time.  I recommend that you take over-the-counter ibuprofen and Tylenol as needed according to label instructions.  I also provided a prescription for Fioricet which may help with your ongoing headaches.  Return to the emergency department if you develop new or worsening symptoms that concern you.

## 2019-12-29 ENCOUNTER — Ambulatory Visit: Payer: Self-pay | Attending: Internal Medicine

## 2019-12-29 DIAGNOSIS — Z23 Encounter for immunization: Secondary | ICD-10-CM

## 2019-12-29 NOTE — Progress Notes (Signed)
   Covid-19 Vaccination Clinic  Name:  Laura Wiggins    MRN: [id number] DOB: 1985-12-12  12/29/2019  [name] was observed post Covid-19 immunization for 15 minutes without incident. She was provided with Vaccine Information Sheet and instruction to access the V-Safe system.   [name] was instructed to call 911 with any severe reactions post vaccine: Marland Kitchen Difficulty breathing  . Swelling of face and throat  . A fast heartbeat  . A bad rash all over body  . Dizziness and weakness   Immunizations Administered    Name Date Dose VIS Date Route   Pfizer COVID-19 Vaccine 12/29/2019  3:37 PM 0.3 mL 07/29/2018 Intramuscular   Manufacturer: [place]   Lot: [id number]   [place]: [id number]

## 2019-12-29 NOTE — Progress Notes (Signed)
   Covid-19 Vaccination Clinic  Name:  Laura Wiggins    MRN: [id number] DOB: May 18, 1986  12/29/2019  [name] was observed post Covid-19 immunization for 15 minutes without incident. She was provided with Vaccine Information Sheet and instruction to access the V-Safe system.   [name] was instructed to call 911 with any severe reactions post vaccine: Marland Kitchen Difficulty breathing  . Swelling of face and throat  . A fast heartbeat  . A bad rash all over body  . Dizziness and weakness   Immunizations Administered    Name Date Dose VIS Date Route   Pfizer COVID-19 Vaccine 12/29/2019  3:37 PM 0.3 mL 07/29/2018 Intramuscular   Manufacturer: [place]   Lot: [id number]   [place]: [id number]

## 2020-01-25 ENCOUNTER — Ambulatory Visit: Payer: Self-pay | Attending: Internal Medicine

## 2020-01-25 DIAGNOSIS — Z23 Encounter for immunization: Secondary | ICD-10-CM

## 2020-01-25 NOTE — Progress Notes (Signed)
   Covid-19 Vaccination Clinic  Name:  Laura Wiggins    MRN: [id number] DOB: [birth year]  01/25/2020  [name] was observed post Covid-19 immunization for 15 minutes without incident. She was provided with Vaccine Information Sheet and instruction to access the V-Safe system.   [name] was instructed to call 911 with any severe reactions post vaccine: Marland Kitchen Difficulty breathing  . Swelling of face and throat  . A fast heartbeat  . A bad rash all over body  . Dizziness and weakness   Immunizations Administered    Name Date Dose VIS Date Route   Pfizer COVID-19 Vaccine 01/25/2020  4:20 PM 0.3 mL 07/29/2018 Intramuscular   Manufacturer: [place]   Lot: [id number]   [place]: [id number]

## 2020-06-08 ENCOUNTER — Other Ambulatory Visit (HOSPITAL_COMMUNITY)
Admission: RE | Admit: 2020-06-08 | Discharge: 2020-06-08 | Disposition: A | Discharge status: Home / Self Care | Payer: 59 | Source: Ambulatory Visit | Attending: Obstetrics & Gynecology | Admitting: Obstetrics & Gynecology

## 2020-06-08 ENCOUNTER — Encounter: Payer: Self-pay | Admitting: Obstetrics & Gynecology

## 2020-06-08 ENCOUNTER — Other Ambulatory Visit: Payer: Self-pay

## 2020-06-08 ENCOUNTER — Ambulatory Visit (INDEPENDENT_AMBULATORY_CARE_PROVIDER_SITE_OTHER): Payer: 59 | Admitting: Obstetrics & Gynecology

## 2020-06-08 VITALS — BP 118/74 | HR 103 | Ht 65.0 in | Wt 224.0 lb

## 2020-06-08 DIAGNOSIS — N870 Mild cervical dysplasia: Secondary | ICD-10-CM

## 2020-06-08 DIAGNOSIS — Z01419 Encounter for gynecological examination (general) (routine) without abnormal findings: Secondary | ICD-10-CM | POA: Diagnosis not present

## 2020-06-08 NOTE — Progress Notes (Signed)
HPI:      [name] is a 35 y.o. [id number] who LMP was Patient's last menstrual period was 06/02/2020 (exact date)., she presents today for her annual examination. The patient has no complaints today. Off of birth control for one year, trying for pregnancy.  Reg cycles, even has done urine ovulation testing (pos).  Does not want aggressive intervention as of yet.  The patient is sexually active. 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 The patient does perform self breast exams.  There is no notable family history of breast or ovarian cancer in her family.  The patient has regular exercise: yes.  The patient denies current symptoms of depression.    GYN History: Contraception: none  PMHx: Past Medical History:  Diagnosis Date  . Cancer ([facility])    Melanoma  . Chicken pox   . COVID-19 07/29/2019   diagnosed 07/29/2019 according to the patient  . Depression   . Endometriosis 2004  . Frequent headaches   . Hx of migraines   . Interstitial cystitis 2004  . Kidney stones   . Urinary incontinence    Past Surgical History:  Procedure Laterality Date  . CESAREAN SECTION N/A 11/14/2017   Procedure: CESAREAN SECTION;  Surgeon: Natale Milch, MD;  Location: [facility] ORS;  Service: Obstetrics;  Laterality: N/A;  . TONSILLECTOMY AND ADENOIDECTOMY  2013   Family History  Problem Relation Age of Onset  . Hyperlipidemia Mother   . Hypertension Mother   . Diabetes Mother    Social History   Tobacco Use  . Smoking status: Former Games developer  . Smokeless tobacco: Never Used  Vaping Use  . Vaping Use: Never used  Substance Use Topics  . Alcohol use: Yes    Alcohol/week: 0.0 standard drinks    Comment: occasioanlly  . Drug use: No    Current Outpatient Medications:  Marland Kitchen  Multiple Vitamin (MULTIVITAMIN) capsule, Take 1 capsule by mouth daily., Disp: , Rfl:  Allergies: Patient has no known allergies.  Review of  Systems  Constitutional: Negative for chills, fever and malaise/fatigue.  HENT: Negative for congestion, sinus pain and sore throat.   Eyes: Negative for blurred vision and pain.  Respiratory: Negative for cough and wheezing.   Cardiovascular: Negative for chest pain and leg swelling.  Gastrointestinal: Negative for abdominal pain, constipation, diarrhea, heartburn, nausea and vomiting.  Genitourinary: Negative for dysuria, frequency, hematuria and urgency.  Musculoskeletal: Negative for back pain, joint pain, myalgias and neck pain.  Skin: Negative for itching and rash.  Neurological: Negative for dizziness, tremors and weakness.  Endo/Heme/Allergies: Does not bruise/bleed easily.  Psychiatric/Behavioral: Negative for depression. The patient is not nervous/anxious and does not have insomnia.     Objective: BP 118/74   Pulse (!) 103   Ht 5\' 5"  (1.651 m)   Wt 224 lb (101.6 kg)   LMP 06/02/2020 (Exact Date)   SpO2 99%   Breastfeeding No   BMI 37.28 kg/m   Filed Weights   06/08/20 1340  Weight: 224 lb (101.6 kg)   Body mass index is 37.28 kg/m. Physical Exam Constitutional:      General: She is not in acute distress.    Appearance: She is well-developed and well-nourished.  Genitourinary:     Vagina, uterus and rectum normal.     There is no rash or lesion on the right labia.     There is no rash or  lesion on the left labia.    No lesions in the vagina.     No vaginal bleeding.      Right Adnexa: not tender and no mass present.    Left Adnexa: not tender and no mass present.    No cervical motion tenderness, friability, lesion or polyp.     Uterus is mobile.     Uterus is not enlarged.     No uterine mass detected.    Uterus is midaxial.     Pelvic exam was performed with patient supine.  Breasts:     Right: No mass, skin change or tenderness.     Left: No mass, skin change or tenderness.    HENT:     Head: Normocephalic and atraumatic. No laceration.     Right  Ear: Hearing normal.     Left Ear: Hearing normal.     Nose: No epistaxis or foreign body.     Mouth/Throat:     Mouth: Oropharynx is clear and moist and mucous membranes are normal.     Pharynx: Uvula midline.  Eyes:     Pupils: Pupils are equal, round, and reactive to light.  Neck:     Thyroid: No thyromegaly.  Cardiovascular:     Rate and Rhythm: Normal rate and regular rhythm.     Heart sounds: No murmur heard. No friction rub. No gallop.   Pulmonary:     Effort: Pulmonary effort is normal. No respiratory distress.     Breath sounds: Normal breath sounds. No wheezing.  Abdominal:     General: Bowel sounds are normal. There is no distension.     Palpations: Abdomen is soft.     Tenderness: There is no abdominal tenderness. There is no rebound.  Musculoskeletal:        General: Normal range of motion.     Cervical back: Normal range of motion and neck supple.  Neurological:     Mental Status: She is alert and oriented to person, place, and time.     Cranial Nerves: No cranial nerve deficit.  Skin:    General: Skin is warm and dry.  Psychiatric:        Mood and Affect: Mood and affect normal.        Judgment: Judgment normal.  Vitals reviewed.     Assessment:  ANNUAL EXAM 1. Women's annual routine gynecological examination   2. CIN I (cervical intraepithelial neoplasia I)      Screening Plan:            1.  Cervical Screening-  Pap smear done today Prior CIN I Biopsy again (as  It has been 2 years, no intervening PAP) if abnormal LEEP and Cryo options as well discussed  2. Breast screening- Exam annually and mammogram>40 planned   3. Labs managed by PCP  4. Counseling for contraception: no method  The pregnancy intention screening data noted above was reviewed. Potential methods of contraception were discussed. The patient elected to proceed with Pregnant/Seeking Pregnancy.   Next steps due to fertility concerns are: Weight loss discussed; HSG as she has had  surgery and rsisks for adhesions discussed; lab work; SA For now, [facility]    F/U  Return in about 1 year (around 06/08/2021) for Annual.  Barnett Applebaum, MD, [name], [facility] 06/08/2020  1:49 PM

## 2020-06-08 NOTE — Patient Instructions (Addendum)
PAP every year Labs yearly (with PCP)  Thank you for choosing [facility]. As part of our ongoing efforts to improve patient experience, we would appreciate your feedback. Please fill out the short survey that you will receive by mail or MyChart. Your opinion is important to [place]! - Dr. Tiburcio Pea   Hysterosalpingography (XRAY of tubes)  Hysterosalpingography is a procedure in which a woman's uterus and fallopian tubes are examined. During this procedure, contrast dye is injected into the uterus through the vagina and cervix. X-rays are then taken. The dye makes the uterus and fallopian tubes show up clearly on the X-rays. This procedure may be done:  To help determine whether there are tumors, scars (adhesions), or other abnormalities in the uterus.  To find out why a woman is unable to have children (infertile).  To make sure the fallopian tubes are completely blocked a few months after having certain tubal sterilization procedures. Tell a health care provider about:  Any allergies you have.  All medicines you are taking, including vitamins, herbs, eye drops, creams, and over-the-counter medicines.  Any problems you or family members have had with the use of anesthetic medicines.  Any blood disorders you have.  Any surgeries you have had.  Any medical conditions you have.  Whether you are pregnant or may be pregnant. What are the risks? Generally, this is a safe procedure. However, problems may occur, including:  Infection in the lining of the uterus (endometritis) or fallopian tubes (salpingitis).  Allergic reaction to medicines or dyes.  Risk of making a hole (perforation) in the uterus or fallopian tubes.  Damage to other structures or organs. What happens before the procedure?  Schedule the procedure after your menstrual period stops, but before your next ovulation. This is usually between day 5 and day 10 of your last period. Day 1 is the first day of your  period.  Ask your health care provider about changing or stopping your regular medicines. This is especially important if you are taking diabetes medicines or blood thinners.  Empty your bladder before the procedure begins.  Plan to have someone take you home from the hospital or clinic. What happens during the procedure?  You may be given one of the following: ? A medicine to help you relax (sedative). ? An over-the-counter pain medicine.  You will lie down on your back and place your feet into footrests (stirrups).  A device called a speculum will be inserted into your vagina. This allows your health care provider to see inside your vagina through to your cervix.  Your cervix will be washed with a germ-killing soap.  A medicine may be injected into your cervix to numb it (local anesthesia).  A thin, flexible tube will be passed through your cervix into your uterus.  Contrast dye will be passed through the tube and into the uterus. Contrast dye may cause some cramping.  Several X-rays will be taken as the contrast dye spreads through the uterus and into the fallopian tubes.  The tube will be removed. The contrast dye will flow out through your vagina naturally. The procedure may vary among health care providers and hospitals. What happens after the procedure?  Most of the contrast dye will flow out of your vagina naturally. You may want to wear a sanitary pad.  You may have mild cramping and vaginal bleeding. This should go away after a short time.  Do not drive for 24 hours if you were given a sedative.  It  is up to you to get the results of your procedure. Ask your health care provider, or the department that is doing the procedure, when your results will be ready. Summary  Hysterosalpingography is a procedure in which a woman's uterus and fallopian tubes are examined.  During this procedure, contrast dye is injected into the uterus through the vagina and cervix. X-rays  are then taken. The dye helps the uterus and fallopian tubes show up clearly on the X-rays.  Schedule the procedure after your menstrual period stops, but before your next ovulation. This is usually between day 5 and day 10 of your last period.  After the procedure, you may have mild cramping and vaginal bleeding. This should go away after a short time. This information is not intended to replace advice given to you by your health care provider. Make sure you discuss any questions you have with your health care provider. Document Revised: 05/03/2017 Document Reviewed: 06/13/2016 Elsevier Patient Education  2020 [employer].

## 2020-06-13 LAB — CYTOLOGY - PAP: Diagnosis: UNDETERMINED — AB

## 2020-06-14 NOTE — Telephone Encounter (Signed)
Patient is scheduled for 12/08/20 with [facility]

## 2020-08-28 ENCOUNTER — Other Ambulatory Visit: Payer: Self-pay

## 2020-08-28 ENCOUNTER — Emergency Department
Admission: EM | Admit: 2020-08-28 | Discharge: 2020-08-28 | Disposition: A | Discharge status: Home / Self Care | Payer: 59 | Attending: Emergency Medicine | Admitting: Emergency Medicine

## 2020-08-28 ENCOUNTER — Emergency Department: Payer: 59

## 2020-08-28 DIAGNOSIS — N201 Calculus of ureter: Secondary | ICD-10-CM

## 2020-08-28 DIAGNOSIS — Z8616 Personal history of COVID-19: Secondary | ICD-10-CM | POA: Insufficient documentation

## 2020-08-28 DIAGNOSIS — Z85820 Personal history of malignant melanoma of skin: Secondary | ICD-10-CM | POA: Diagnosis not present

## 2020-08-28 DIAGNOSIS — Z87891 Personal history of nicotine dependence: Secondary | ICD-10-CM | POA: Insufficient documentation

## 2020-08-28 DIAGNOSIS — N132 Hydronephrosis with renal and ureteral calculous obstruction: Secondary | ICD-10-CM | POA: Insufficient documentation

## 2020-08-28 DIAGNOSIS — R109 Unspecified abdominal pain: Secondary | ICD-10-CM | POA: Diagnosis present

## 2020-08-28 LAB — URINALYSIS, COMPLETE (UACMP) WITH MICROSCOPIC
Bilirubin Urine: NEGATIVE
Glucose, UA: NEGATIVE mg/dL
Ketones, ur: NEGATIVE mg/dL
Leukocytes,Ua: NEGATIVE
Nitrite: NEGATIVE
Protein, ur: NEGATIVE mg/dL
RBC / HPF: >50 RBC/hpf — ABNORMAL HIGH (ref 0–5)
Specific Gravity, Urine: 1.02 (ref 1.005–1.030)
pH: 5 (ref 5.0–8.0)

## 2020-08-28 LAB — CBC
HCT: 37.1 % (ref 36.0–46.0)
Hemoglobin: 12.6 g/dL (ref 12.0–15.0)
MCH: 29.8 pg (ref 26.0–34.0)
MCHC: 34 g/dL (ref 30.0–36.0)
MCV: 87.7 fL (ref 80.0–100.0)
Platelets: 268 10*3/uL (ref 150–400)
RBC: 4.23 MIL/uL (ref 3.87–5.11)
RDW: 13 % (ref 11.5–15.5)
WBC: 8.7 10*3/uL (ref 4.0–10.5)
nRBC: 0 % (ref 0.0–0.2)

## 2020-08-28 LAB — COMPREHENSIVE METABOLIC PANEL
ALT: 19 U/L (ref 0–44)
AST: 19 U/L (ref 15–41)
Albumin: 4.4 g/dL (ref 3.5–5.0)
Alkaline Phosphatase: 29 U/L — ABNORMAL LOW (ref 38–126)
Anion gap: 10 (ref 5–15)
BUN: 12 mg/dL (ref 6–20)
CO2: 22 mmol/L (ref 22–32)
Calcium: 9.4 mg/dL (ref 8.9–10.3)
Chloride: 107 mmol/L (ref 98–111)
Creatinine, Ser: 0.93 mg/dL (ref 0.44–1.00)
GFR, Estimated: >60 mL/min (ref >60)
Glucose, Bld: 108 mg/dL — ABNORMAL HIGH (ref 70–99)
Potassium: 3.9 mmol/L (ref 3.5–5.1)
Sodium: 139 mmol/L (ref 135–145)
Total Bilirubin: 0.6 mg/dL (ref 0.3–1.2)
Total Protein: 7.2 g/dL (ref 6.5–8.1)

## 2020-08-28 LAB — POC URINE PREG, ED: Preg Test, Ur: NEGATIVE

## 2020-08-28 LAB — LIPASE, BLOOD: Lipase: 37 U/L (ref 11–51)

## 2020-08-28 MED ORDER — ONDANSETRON HCL 4 MG/2ML IJ SOLN
4.0000 mg | INTRAMUSCULAR | Status: AC
  Administered 2020-08-28: 4 mg via INTRAVENOUS
  Filled 2020-08-28: qty 2

## 2020-08-28 MED ORDER — ONDANSETRON 4 MG PO TBDP: ORAL_TABLET | ORAL | 0 refills | Status: DC

## 2020-08-28 MED ORDER — MORPHINE SULFATE (PF) 4 MG/ML IV SOLN
4.0000 mg | Freq: Once | INTRAVENOUS | Status: AC
  Administered 2020-08-28: 4 mg via INTRAVENOUS
  Filled 2020-08-28: qty 1

## 2020-08-28 MED ORDER — SODIUM CHLORIDE 0.9 % IV SOLN
1.5000 mg/kg | Freq: Once | INTRAVENOUS | Status: AC
  Administered 2020-08-28: 142 mg via INTRAVENOUS
  Filled 2020-08-28: qty 7.1

## 2020-08-28 MED ORDER — OXYCODONE-ACETAMINOPHEN 5-325 MG PO TABS: 2.0000 | ORAL_TABLET | Freq: Four times a day (QID) | ORAL | 0 refills | Status: DC | PRN

## 2020-08-28 MED ORDER — KETOROLAC TROMETHAMINE 30 MG/ML IJ SOLN
15.0000 mg | Freq: Once | INTRAMUSCULAR | Status: AC
  Administered 2020-08-28: 15 mg via INTRAVENOUS
  Filled 2020-08-28: qty 1

## 2020-08-28 MED ORDER — TAMSULOSIN HCL 0.4 MG PO CAPS: ORAL_CAPSULE | ORAL | 0 refills | Status: DC

## 2020-08-28 MED ORDER — OXYCODONE-ACETAMINOPHEN 5-325 MG PO TABS
2.0000 | ORAL_TABLET | Freq: Once | ORAL | Status: AC
  Administered 2020-08-28: 2 via ORAL
  Filled 2020-08-28: qty 2

## 2020-08-28 NOTE — ED Provider Notes (Signed)
[facility] Emergency Department Provider Note  ____________________________________________   Event Date/Time   First MD Initiated Contact with Patient 08/28/20 [phone]     (approximate)  I have reviewed the triage vital signs and the nursing notes.   HISTORY  Chief Complaint Abdominal Pain (Left flank)    HPI Laura Wiggins is a 35 y.o. female with medical history as listed below who presents for evaluation of acute onset and severe sharp pain to her left side radiating up into the left side of her back.   The symptoms have been acutely at about 11:30 PM but of note she had similar but milder symptoms several days ago but did not think anything of it because it went away.  The pain has been constant for the last couple of hours.  It is accompanied with nausea and one episode of vomiting.  Nothing in particular makes it better or worse and she cannot find a position of comfort.  She denies fever, sore throat, chest pain, shortness of breath.  She has no pain in her pelvis or on the right side of her abdomen.  She has a history of kidney stones but says that this feels different.  Her last menstrual cycle was about a week ago.        Past Medical History:  Diagnosis Date  . Cancer ([place])    Melanoma  . Chicken pox   . COVID-19 07/29/2019   diagnosed 07/29/2019 according to the patient  . Depression   . Endometriosis 2004  . Frequent headaches   . Hx of migraines   . Interstitial cystitis 2004  . Kidney stones   . Urinary incontinence     Patient Active Problem List   Diagnosis Date Noted  . Postpartum care following cesarean delivery 11/16/2017  . PROM (premature rupture of membranes) 11/13/2017  . Supervision of high risk pregnancy, antepartum 04/05/2017  . History of preterm delivery, currently pregnant 04/05/2017  . Obesity affecting pregnancy 04/05/2017  . BMI 35.0-35.9,adult 04/05/2017  . GBS bacteriuria 04/05/2017  . Chest pain 10/22/2016   . Lower extremity edema 09/11/2016  . Erythromelalgia ([place]) 08/07/2016  . Depression 12/05/2015  . Obesity 02/25/2015    Past Surgical History:  Procedure Laterality Date  . CESAREAN SECTION N/A 11/14/2017   Procedure: CESAREAN SECTION;  Surgeon: Homero Fellers, MD;  Location: [facility] ORS;  Service: Obstetrics;  Laterality: N/A;  . TONSILLECTOMY AND ADENOIDECTOMY  2013    Prior to Admission medications   Medication Sig Start Date End Date Taking? Authorizing Provider  ondansetron (ZOFRAN ODT) 4 MG disintegrating tablet Allow 1-2 tablets to dissolve in your mouth every 8 hours as needed for nausea/vomiting 08/28/20  Yes Hinda Kehr, MD  oxyCODONE-acetaminophen (PERCOCET) 5-325 MG tablet Take 2 tablets by mouth every 6 (six) hours as needed for severe pain. 08/28/20  Yes Hinda Kehr, MD  tamsulosin (FLOMAX) 0.4 MG CAPS capsule Take 1 tablet by mouth daily until you pass the kidney stone or no longer have symptoms 08/28/20  Yes Hinda Kehr, MD  Multiple Vitamin (MULTIVITAMIN) capsule Take 1 capsule by mouth daily.    [provider]    Allergies Patient has no known allergies.  Family History  Problem Relation Age of Onset  . Hyperlipidemia Mother   . Hypertension Mother   . Diabetes Mother     Social History Social History   Tobacco Use  . Smoking status: Former Research scientist (life sciences)  . Smokeless tobacco: Never Used  Vaping  Use  . Vaping Use: Never used  Substance Use Topics  . Alcohol use: Not Currently    Alcohol/week: 0.0 standard drinks    Comment: occasioanlly  . Drug use: No    Review of Systems Constitutional: No fever/chills Eyes: No visual changes. ENT: No sore throat. Cardiovascular: Denies chest pain. Respiratory: Denies shortness of breath. Gastrointestinal: Left-sided flank and left lateral abdominal pain rating to the back.  1 episode of nausea and vomiting. Genitourinary: Negative for dysuria. Musculoskeletal: Left flank pain. Integumentary:  Negative for rash. Neurological: Negative for headaches, focal weakness or numbness.   ____________________________________________   PHYSICAL EXAM:  VITAL SIGNS: ED Triage Vitals [08/28/20 0046]  Enc Vitals Group     BP 140/78     Pulse Rate 95     Resp 18     Temp 98.2 F (36.8 C)     Temp Source Oral     SpO2 100 %     Weight 95.3 kg (210 lb)     Height 1.651 m (5\' 5" )     Head Circumference      Peak Flow      Pain Score 10     Pain Loc      Pain Edu?      Excl. in [place]?     Constitutional: Alert and oriented.  Eyes: Conjunctivae are normal.  Head: Atraumatic. Nose: No congestion/rhinnorhea. Mouth/Throat: Patient is wearing a mask. Neck: No stridor.  No meningeal signs.   Cardiovascular: Normal rate, regular rhythm. Good peripheral circulation. Respiratory: Normal respiratory effort.  No retractions. Gastrointestinal: Soft and nondistended.  No tenderness to palpation in the right lower quadrant or the left lower quadrant.  Some tenderness extending up towards the left flank.  No focal peritonitis. Musculoskeletal: No lower extremity tenderness nor edema. No gross deformities of extremities. Neurologic:  Normal speech and language. No gross focal neurologic deficits are appreciated.  Skin:  Skin is warm, dry and intact. Psychiatric: Mood and affect are normal. Speech and behavior are normal.  ____________________________________________   LABS (all labs ordered are listed, but only abnormal results are displayed)  Labs Reviewed  COMPREHENSIVE METABOLIC PANEL - Abnormal; Notable for the following components:      Result Value   Glucose, Bld 108 (*)    Alkaline Phosphatase 29 (*)    All other components within normal limits  URINALYSIS, COMPLETE (UACMP) WITH MICROSCOPIC - Abnormal; Notable for the following components:   Color, Urine YELLOW (*)    APPearance HAZY (*)    Hgb urine dipstick LARGE (*)    RBC / HPF >50 (*)    Bacteria, UA RARE (*)    All other  components within normal limits  LIPASE, BLOOD  CBC  POC URINE PREG, ED   ____________________________________________  EKG  No indication for emergent EKG ____________________________________________  RADIOLOGY I, Hinda Kehr, personally viewed and evaluated these images (plain radiographs) as part of my medical decision making, as well as reviewing the written report by the radiologist.  ED MD interpretation: Mild left hydroureteronephrosis with a 4 mm obstructing stone at the left UVJ.  Patient also has a 2 mm nonobstructive stone at the right renal pelvis.  Official radiology report(s): CT Renal Stone Study  Result Date: 08/28/2020 CLINICAL DATA:  Left-sided flank pain EXAM: CT ABDOMEN AND PELVIS WITHOUT CONTRAST TECHNIQUE: Multidetector CT imaging of the abdomen and pelvis was performed following the standard protocol without IV contrast. COMPARISON:  08/19/2019 FINDINGS: LOWER CHEST: Normal. HEPATOBILIARY: Normal hepatic  contours. No intra- or extrahepatic biliary dilatation. Normal gallbladder. PANCREAS: Normal pancreas. No ductal dilatation or peripancreatic fluid collection. SPLEEN: Normal. ADRENALS/URINARY TRACT: The adrenal glands are normal. 2 mm nonobstructive stone at the right renal pelvis. There is mild left hydroureteronephrosis with an obstructing stone at the left ureterovesical junction measuring 4 mm. The urinary bladder is normal for degree of distention STOMACH/BOWEL: There is no hiatal hernia. Normal duodenal course and caliber. No small bowel dilatation or inflammation. No focal colonic abnormality. Normal appendix. VASCULAR/LYMPHATIC: Normal course and caliber of the major abdominal vessels. No abdominal or pelvic lymphadenopathy. REPRODUCTIVE: Normal uterus. No adnexal mass. MUSCULOSKELETAL. No bony spinal canal stenosis or focal osseous abnormality. OTHER: None. IMPRESSION: 1. Mild left hydroureteronephrosis with 4 mm obstructing stone at the left ureterovesical  junction. 2. 2 mm nonobstructive stone at the right renal pelvis. Electronically Signed   By: Ulyses Jarred M.D.   On: 08/28/2020 02:55    ____________________________________________   PROCEDURES   Procedure(s) performed (including Critical Care):  Procedures   ____________________________________________   INITIAL IMPRESSION / MDM / [place] / ED COURSE  As part of my medical decision making, I reviewed the following data within the [place] notes reviewed and incorporated, Labs reviewed , Old chart reviewed, Notes from prior ED visits and [place] Controlled Substance Database   Differential diagnosis includes, but is not limited to, kidney/ureteral stone, UTI/pyonephritis, ovarian etiology such as cyst or torsion, less likely STD/PID.  Strongly suspect ureteral cyst.  Morphine 4 mg IV, Toradol 15 mg IV, Zofran 4 mg IV.  Comprehensive metabolic panel and CBC are within normal limits.  Urinalysis indicates hematuria which likely is from the ureteral stone although could be result of her history of interstitial cystitis.  Urine pregnancy test is negative.  Proceeding with a CT renal stone protocol.  Patient understands and agrees with the plan.       Clinical Course as of 08/28/20 1884  Nancy Fetter Aug 28, 2020  0524 CT Renal Stone Study CT scan demonstrates 4 mm obstructive left UVJ stone.  Patient is still having a significant amount of pain.  I ordered morphine 4 mg IV (second dose) and lidocaine 1.5 mg/kg IV as per renal colic pain protocol.  I discussed the results with the patient and the plan of care and she understands and agrees. [CF]  [id number] Patient feels much better by now.  Pain is not gone but it is tolerable.  She is comfortable with the plan for discharge and outpatient follow-up with urology.  I gave my usual and customary recommendations and return precautions. [CF]    Clinical Course User Index [CF] Hinda Kehr, MD      ____________________________________________  FINAL CLINICAL IMPRESSION(S) / ED DIAGNOSES  Final diagnoses:  Left ureteral stone     MEDICATIONS GIVEN DURING THIS VISIT:  Medications  morphine 4 MG/ML injection 4 mg (4 mg Intravenous Given 08/28/20 0255)  ondansetron (ZOFRAN) injection 4 mg (4 mg Intravenous Given 08/28/20 0256)  ketorolac (TORADOL) 30 MG/ML injection 15 mg (15 mg Intravenous Given 08/28/20 0257)  lidocaine (XYLOCAINE) 142 mg in sodium chloride 0.9 % 100 mL IVPB (0 mg/kg  95.3 kg Intravenous Stopped 08/28/20 0534)  morphine 4 MG/ML injection 4 mg (4 mg Intravenous Given 08/28/20 0504)  oxyCODONE-acetaminophen (PERCOCET/ROXICET) 5-325 MG per tablet 2 tablet (2 tablets Oral Given 08/28/20 1660)     ED Discharge Orders         Ordered    oxyCODONE-acetaminophen (  PERCOCET) 5-325 MG tablet  Every 6 hours PRN        08/28/20 0710    ondansetron (ZOFRAN ODT) 4 MG disintegrating tablet        08/28/20 0710    tamsulosin (FLOMAX) 0.4 MG CAPS capsule        08/28/20 0710          *Please note:  MAALLE STARRETT was evaluated in Emergency Department on 08/28/2020 for the symptoms described in the history of present illness. She was evaluated in the context of the global COVID-19 pandemic, which necessitated consideration that the patient might be at risk for infection with the SARS-CoV-2 virus that causes COVID-19. Institutional protocols and algorithms that pertain to the evaluation of patients at risk for COVID-19 are in a state of rapid change based on information released by regulatory bodies including the CDC and federal and state organizations. These policies and algorithms were followed during the patient's care in the ED.  Some ED evaluations and interventions may be delayed as a result of limited staffing during and after the pandemic.*  Note:  This document was prepared using Dragon voice recognition software and may include unintentional dictation errors.    Hinda Kehr, MD 08/28/20 [phone]

## 2020-08-28 NOTE — ED Notes (Signed)
Patient given urine strainer

## 2020-08-28 NOTE — ED Triage Notes (Signed)
Pt arrives to ED from home via PoV with c/c of left side abdominal pain beginning at approx 1145 pm. Pt states onset was sudden and was so severe that she was not able to ambulate and has had three episodes of vomiting. Pt claims Hx of kidney stones but states this "feels different". Pt denies NVD, no pain with urination at this time. Pt A&Ox4.

## 2020-08-28 NOTE — Discharge Instructions (Signed)
You have been seen in the Emergency Department (ED) today for pain caused by kidney stones.  As we have discussed, please drink plenty of fluids.  Please make a follow up appointment with the physician(s) listed elsewhere in this documentation.  You may take pain medication as needed but ONLY as prescribed.  Please also take your prescribed Flomax daily.  We also recommend that you take over-the-counter ibuprofen regularly according to label instructions over the next 5 days.  Take it with meals to minimize stomach discomfort.  Please see your doctor as soon as possible as stones may take 1-3 weeks to pass and you may require additional care or medications.  Do not drink alcohol, drive or participate in any other potentially dangerous activities while taking opiate pain medication as it may make you sleepy. Do not take this medication with any other sedating medications, either prescription or over-the-counter. If you were prescribed Percocet or Vicodin, do not take these with acetaminophen (Tylenol) as it is already contained within these medications.   Take Percocet as needed for severe pain.  This medication is an opiate (or narcotic) pain medication and can be habit forming.  Use it as little as possible to achieve adequate pain control.  Do not use or use it with extreme caution if you have a history of opiate abuse or dependence.  If you are on a pain contract with your primary care doctor or a pain specialist, be sure to let them know you were prescribed this medication today from the [facility] Emergency Department.  This medication is intended for your use only - do not give any to anyone else and keep it in a secure place where nobody else, especially children, have access to it.  It will also cause or worsen constipation, so you may want to consider taking an over-the-counter stool softener while you are taking this medication.  Return to the Emergency Department (ED) or call your doctor  if you have any worsening pain, fever, painful urination, are unable to urinate, or develop other symptoms that concern you.

## 2020-08-30 ENCOUNTER — Encounter: Payer: Self-pay | Admitting: Urology

## 2020-08-30 ENCOUNTER — Other Ambulatory Visit: Payer: Self-pay

## 2020-08-30 ENCOUNTER — Ambulatory Visit (INDEPENDENT_AMBULATORY_CARE_PROVIDER_SITE_OTHER): Payer: 59 | Admitting: Urology

## 2020-08-30 VITALS — BP 136/83 | HR 81 | Ht 65.0 in | Wt 228.0 lb

## 2020-08-30 DIAGNOSIS — N201 Calculus of ureter: Secondary | ICD-10-CM | POA: Diagnosis not present

## 2020-08-30 DIAGNOSIS — N3010 Interstitial cystitis (chronic) without hematuria: Secondary | ICD-10-CM

## 2020-08-30 NOTE — Progress Notes (Signed)
08/30/20 1:26 PM   Laura Wiggins 04-15-1986 [id number]  CC: Left ureteral stone, interstitial cystitis  HPI: I saw [name] in urology clinic today for the above issues.  She is a 35 year old female with a distant diagnosis of interstitial cystitis as a teenager in the [place] who presented to the ED on 08/28/2020 with acute onset of left groin pain and gross hematuria.  CT stone protocol showed a 5 mm left distal ureteral stone with mild upstream hydronephrosis, and a 2 mm nonobstructive right upper pole stone.  Urinalysis showed greater than 50 RBCs, 6-10 WBCs, rare bacteria, nitrite negative, no leukocytes, pH of 5.  She was discharged on pain medications and Flomax for medical expulsive therapy.  She reports 2-3 prior stone episodes, all passed spontaneously.  She also has mild stress incontinence.  She denies any urinary symptoms, fevers, or chills.   PMH: Past Medical History:  Diagnosis Date  . Cancer ([place])    Melanoma  . Chicken pox   . COVID-19 07/29/2019   diagnosed 07/29/2019 according to the patient  . Depression   . Endometriosis 2004  . Frequent headaches   . Hx of migraines   . Interstitial cystitis 2004  . Kidney stones   . Urinary incontinence     Surgical History: Past Surgical History:  Procedure Laterality Date  . CESAREAN SECTION N/A 11/14/2017   Procedure: CESAREAN SECTION;  Surgeon: Homero Fellers, MD;  Location: [facility] ORS;  Service: Obstetrics;  Laterality: N/A;  . TONSILLECTOMY AND ADENOIDECTOMY  2013    Family History: Family History  Problem Relation Age of Onset  . Hyperlipidemia Mother   . Hypertension Mother   . Diabetes Mother     Social History:  reports that she has quit smoking. She has never used smokeless tobacco. She reports previous alcohol use. She reports that she does not use drugs.  Physical Exam: BP 136/83   Pulse 81   Ht 5\' 5"  (1.651 m)   Wt 228 lb (103.4 kg)   BMI 37.94 kg/m    Constitutional:  Alert and  oriented, No acute distress. Cardiovascular: No clubbing, cyanosis, or edema. Respiratory: Normal respiratory effort, no increased work of breathing. GI: Abdomen is soft, nontender, nondistended, no abdominal masses  Laboratory Data: Reviewed, see HPI  Pertinent Imaging: I have personally viewed and interpreted the CT stone protocol dated 08/28/2020, see HPI for details  Assessment & Plan:   35 year old female with a 5 mm left distal ureteral stone.  She has no clinical or laboratory evidence of infection.  Stone may be uric acid based on urine pH of 5, and stone density of 250HU on CT.  We discussed various treatment options for urolithiasis including observation with or without medical expulsive therapy, shockwave lithotripsy (SWL), ureteroscopy and laser lithotripsy with stent placement, and percutaneous nephrolithotomy.  We discussed that management is based on stone size, location, density, patient co-morbidities, and patient preference.   Stones <67mm in size have a >80% spontaneous passage rate. Data surrounding the use of tamsulosin for medical expulsive therapy is controversial, but meta analyses suggests it is most efficacious for distal stones between 5-25mm in size. Possible side effects include dizziness/lightheadedness, and retrograde ejaculation.  SWL has a lower stone free rate in a single procedure, but also a lower complication rate compared to ureteroscopy and avoids a stent and associated stent related symptoms. Possible complications include renal hematoma, steinstrasse, and need for additional treatment.  Ureteroscopy with laser lithotripsy and stent placement has  a higher stone free rate than SWL in a single procedure, however increased complication rate including possible infection, ureteral injury, bleeding, and stent related morbidity. Common stent related symptoms include dysuria, urgency/frequency, and flank pain.  After an extensive discussion of the risks and  benefits of the above treatment options, the patient would like to proceed with medical expulsive therapy.  Return precautions discussed extensively.  She is interested in pursuing a 55-DDUK urine metabolic work-up after this acute stone episode to try to prevent further stone occurrences.  Stone may be uric acid based on urine pH of 5 and stone density of 250HU on CT.  RTC 2 weeks with UA, patient will call if she has poorly controlled pain and would like to schedule ureteroscopy and laser lithotripsy  Nickolas [place], MD 08/30/2020  [place] [address] [place], [place] [address] [phone]

## 2020-08-30 NOTE — Patient Instructions (Signed)
Textbook [employer] Natural [employer] (5th ed., pp. 1518-1527.e3). [place], [place]: Else[employer].">  Dietary Guidelines to Help Prevent Kidney Stones Kidney stones are deposits of minerals and salts that form inside your kidneys. Your risk of developing kidney stones may be greater depending on your diet, your lifestyle, the medicines you take, and whether you have certain medical conditions. Most people can lower their chances of developing kidney stones by following the instructions below. Your dietitian may give you more specific instructions depending on your overall health and the type of kidney stones you tend to develop. What are tips for following this plan? Reading food labels  Choose foods with "no salt added" or "low-salt" labels. Limit your salt (sodium) intake to less than 1,500 mg a day.  Choose foods with calcium for each meal and snack. Try to eat about 300 mg of calcium at each meal. Foods that contain 200-500 mg of calcium a serving include: ? 8 oz (237 mL) of milk, calcium-fortifiednon-dairy milk, and calcium-fortifiedfruit juice. Calcium-fortified means that calcium has been added to these drinks. ? 8 oz (237 mL) of kefir, yogurt, and soy yogurt. ? 4 oz (114 g) of tofu. ? 1 oz (28 g) of cheese. ? 1 cup (150 g) of dried figs. ? 1 cup (91 g) of cooked broccoli. ? One 3 oz (85 g) can of sardines or mackerel. Most people need 1,000-1,500 mg of calcium a day. Talk to your dietitian about how much calcium is recommended for you.   Shopping  Buy plenty of fresh fruits and vegetables. Most people do not need to avoid fruits and vegetables, even if these foods contain nutrients that may contribute to kidney stones.  When shopping for convenience foods, choose: ? Whole pieces of fruit. ? Pre-made salads with dressing on the side. ? Low-fat fruit and yogurt smoothies.  Avoid buying frozen meals or prepared deli foods. These can be high in sodium.  Look for foods with live cultures, such as  yogurt and kefir.  Choose high-fiber grains, such as whole-wheat breads, oat bran, and wheat cereals. Cooking  Do not add salt to food when cooking. Place a salt shaker on the table and allow each person to add his or her own salt to taste.  Use vegetable protein, such as beans, textured vegetable protein (TVP), or tofu, instead of meat in pasta, casseroles, and soups. Meal planning  Eat less salt, if told by your dietitian. To do this: ? Avoid eating processed or pre-made food. ? Avoid eating fast food.  Eat less animal protein, including cheese, meat, poultry, or fish, if told by your dietitian. To do this: ? Limit the number of times you have meat, poultry, fish, or cheese each week. Eat a diet free of meat at least 2 days a week. ? Eat only one serving each day of meat, poultry, fish, or seafood. ? When you prepare animal protein, cut pieces into small portion sizes. For most meat and fish, one serving is about the size of the palm of your hand.  Eat at least five servings of fresh fruits and vegetables each day. To do this: ? Keep fruits and vegetables on hand for snacks. ? Eat one piece of fruit or a handful of berries with breakfast. ? Have a salad and fruit at lunch. ? Have two kinds of vegetables at dinner.  Limit foods that are high in a substance called oxalate. These include: ? Spinach (cooked), rhubarb, beets, sweet potatoes, and Swiss chard. ? Peanuts. ?   Potato chips, french fries, and baked potatoes with skin on. ? Nuts and nut products. ? Chocolate.  If you regularly take a diuretic medicine, make sure to eat at least 1 or 2 servings of fruits or vegetables that are high in potassium each day. These include: ? Avocado. ? Banana. ? Orange, prune, carrot, or tomato juice. ? Baked potato. ? Cabbage. ? Beans and split peas. Lifestyle  Drink enough fluid to keep your urine pale yellow. This is the most important thing you can do. Spread your fluid intake throughout  the day.  If you drink alcohol: ? Limit how much you use to:  0-1 drink a day for women who are not pregnant.  0-2 drinks a day for men. ? Be aware of how much alcohol is in your drink. In the [place] one drink equals one 12 oz bottle of beer (355 mL), one 5 oz glass of wine (148 mL), or one 1 oz glass of hard liquor (44 mL).  Lose weight if told by your health care provider. Work with your dietitian to find an eating plan and weight loss strategies that work best for you.   General information  Talk to your health care provider and dietitian about taking daily supplements. You may be told the following depending on your health and the cause of your kidney stones: ? Not to take supplements with vitamin C. ? To take a calcium supplement. ? To take a daily probiotic supplement. ? To take other supplements such as magnesium, fish oil, or vitamin B6.  Take over-the-counter and prescription medicines only as told by your health care provider. These include supplements. What foods should I limit? Limit your intake of the following foods, or eat them as told by your dietitian. Vegetables Spinach. Rhubarb. Beets. Canned vegetables. Angie Fava. Olives. Baked potatoes with skin. Grains Wheat bran. Baked goods. Salted crackers. Cereals high in sugar. Meats and other proteins Nuts. Nut butters. Large portions of meat, poultry, or fish. Salted, precooked, or cured meats, such as sausages, meat loaves, and hot dogs. Dairy Cheese. Beverages Regular soft drinks. Regular vegetable juice. Seasonings and condiments Seasoning blends with salt. Salad dressings. Soy sauce. Ketchup. Barbecue sauce. Other foods Canned soups. Canned pasta sauce. Casseroles. Pizza. Lasagna. Frozen meals. Potato chips. [place] fries. The items listed above may not be a complete list of foods and beverages you should limit. Contact a dietitian for more information. What foods should I avoid? Talk to your dietitian about  specific foods you should avoid based on the type of kidney stones you have and your overall health. Fruits Grapefruit. The item listed above may not be a complete list of foods and beverages you should avoid. Contact a dietitian for more information. Summary  Kidney stones are deposits of minerals and salts that form inside your kidneys.  You can lower your risk of kidney stones by making changes to your diet.  The most important thing you can do is drink enough fluid. Drink enough fluid to keep your urine pale yellow.  Talk to your dietitian about how much calcium you should have each day, and eat less salt and animal protein as told by your dietitian. This information is not intended to replace advice given to you by your health care provider. Make sure you discuss any questions you have with your health care provider. Document Revised: 05/14/2019 Document Reviewed: 05/14/2019 Elsevier Patient Education  2021 [place] for Kidney Stones Laser therapy for kidney stones  is a procedure to break up small, hard mineral deposits that form in the kidney (kidney stones). The procedure is done using a device that produces a focused beam of light (laser). The laser breaks up kidney stones into pieces that are small enough to be passed out of the body through urination or removed from the body during the procedure. You may need laser therapy if you have kidney stones that are painful or block your urinary tract. This procedure is done by inserting a tube (ureteroscope) into your kidney through the urethral opening. The urethra is the part of the body that drains urine from the bladder. In women, the urethra opens above the vaginal opening. In men, the urethra opens at the tip of the penis. The ureteroscope is inserted through the urethra, and surgical instruments are moved through the bladder and the muscular tube that connects the kidney to the bladder (ureter) until they reach the  kidney. Tell a health care provider about:  Any allergies you have.  All medicines you are taking, including vitamins, herbs, eye drops, creams, and over-the-counter medicines.  Any problems you or family members have had with anesthetic medicines.  Any blood disorders you have.  Any surgeries you have had.  Any medical conditions you have.  Whether you are pregnant or may be pregnant. What are the risks? Generally, this is a safe procedure. However, problems may occur, including:  Infection.  Bleeding.  Allergic reactions to medicines.  Damage to the urethra, bladder, or ureter.  Urinary tract infection (UTI).  Narrowing of the urethra (urethral stricture).  Difficulty passing urine.  Blockage of the kidney caused by a fragment of kidney stone. What happens before the procedure? Medicines  Ask your health care provider about: ? Changing or stopping your regular medicines. This is especially important if you are taking diabetes medicines or blood thinners. ? Taking medicines such as aspirin and ibuprofen. These medicines can thin your blood. Do not take these medicines unless your health care provider tells you to take them. ? Taking over-the-counter medicines, vitamins, herbs, and supplements. Eating and drinking Follow instructions from your health care provider about eating and drinking, which may include:  8 hours before the procedure - stop eating heavy meals or foods, such as meat, fried foods, or fatty foods.  6 hours before the procedure - stop eating light meals or foods, such as toast or cereal.  6 hours before the procedure - stop drinking milk or drinks that contain milk.  2 hours before the procedure - stop drinking clear liquids. Staying hydrated Follow instructions from your health care provider about hydration, which may include:  Up to 2 hours before the procedure - you may continue to drink clear liquids, such as water, clear fruit juice, black  coffee, and plain tea.   General instructions  You may have a physical exam before the procedure. You may also have tests, such as imaging tests and blood or urine tests.  If your ureter is too narrow, your health care provider may place a soft, flexible tube (stent) inside of it. The stent may be placed days or weeks before your laser therapy procedure.  Plan to have someone take you home from the hospital or clinic.  If you will be going home right after the procedure, plan to have someone stay with you for 24 hours.  Do not use any products that contain nicotine or tobacco for at least 4 weeks before the procedure. These products include cigarettes,  e-cigarettes, and chewing tobacco. If you need help quitting, ask your health care provider.  Ask your health care provider: ? How your surgical site will be marked or identified. ? What steps will be taken to help prevent infection. These may include:  Removing hair at the surgery site.  Washing skin with a germ-killing soap.  Taking antibiotic medicine. What happens during the procedure?  An IV will be inserted into one of your veins.  You will be given one or more of the following: ? A medicine to help you relax (sedative). ? A medicine to numb the area (local anesthetic). ? A medicine to make you fall asleep (general anesthetic).  A ureteroscope will be inserted into your urethra. The ureteroscope will send images to a video screen in the operating room to guide your surgeon to the area of your kidney that will be treated.  A small, flexible tube will be threaded through the ureteroscope and into your bladder and ureter, up to your kidney.  The laser device will be inserted into your kidney through the tube. Your surgeon will pulse the laser on and off to break up kidney stones.  A surgical instrument that has a tiny wire basket may be inserted through the tube into your kidney to remove the pieces of broken kidney stone. The  procedure may vary among health care providers and hospitals.   What happens after the procedure?  Your blood pressure, heart rate, breathing rate, and blood oxygen level will be monitored until you leave the hospital or clinic.  You will be given pain medicine as needed.  You may continue to receive antibiotics.  You may have a stent temporarily placed in your ureter.  Do not drive for 24 hours if you were given a sedative during your procedure.  You may be given a strainer to collect any stone fragments that you pass in your urine. Your health care provider may have these tested. Summary  Laser therapy for kidney stones is a procedure to break up kidney stones into pieces that are small enough to be passed out of the body through urination or removed during the procedure.  Follow instructions from your health care provider about eating and drinking before the procedure.  During the procedure, the ureteroscope will send images to a video screen to guide your surgeon to the area of your kidney that will be treated.  Do not drive for 24 hours if you were given a sedative during your procedure. This information is not intended to replace advice given to you by your health care provider. Make sure you discuss any questions you have with your health care provider. Document Revised: 01/30/2018 Document Reviewed: 01/30/2018 Elsevier Patient Education  2021 [employer].

## 2020-09-12 ENCOUNTER — Other Ambulatory Visit: Payer: Self-pay

## 2020-09-12 DIAGNOSIS — N201 Calculus of ureter: Secondary | ICD-10-CM

## 2020-09-13 ENCOUNTER — Other Ambulatory Visit: Payer: Self-pay

## 2020-09-13 ENCOUNTER — Ambulatory Visit (INDEPENDENT_AMBULATORY_CARE_PROVIDER_SITE_OTHER): Payer: 59 | Admitting: Urology

## 2020-09-13 ENCOUNTER — Other Ambulatory Visit
Admission: RE | Admit: 2020-09-13 | Discharge: 2020-09-13 | Disposition: A | Discharge status: Home / Self Care | Payer: 59 | Attending: Urology | Admitting: Urology

## 2020-09-13 VITALS — BP 120/77 | HR 90 | Ht 65.0 in | Wt 228.0 lb

## 2020-09-13 DIAGNOSIS — N200 Calculus of kidney: Secondary | ICD-10-CM

## 2020-09-13 DIAGNOSIS — N201 Calculus of ureter: Secondary | ICD-10-CM | POA: Insufficient documentation

## 2020-09-13 LAB — URINALYSIS, COMPLETE (UACMP) WITH MICROSCOPIC
Bilirubin Urine: NEGATIVE
Glucose, UA: NEGATIVE mg/dL
Hgb urine dipstick: NEGATIVE
Ketones, ur: NEGATIVE mg/dL
Nitrite: NEGATIVE
Protein, ur: NEGATIVE mg/dL
Specific Gravity, Urine: 1.015 (ref 1.005–1.030)
pH: 5.5 (ref 5.0–8.0)

## 2020-09-13 NOTE — Patient Instructions (Addendum)

## 2020-09-13 NOTE — Progress Notes (Signed)
   09/13/2020 11:14 AM   Laura Wiggins 1986-04-01 [id number]  Reason for visit: Follow up kidney stone  HPI: 35 year old female with a history of interstitial cystitis and a left 5 mm distal ureteral stone seen on CT on 08/28/2020.  She opted for medical expulsive therapy.  She reports her pain has almost completely improved and she really denies any renal colic today.  Urinalysis today with resolution of prior microscopic hematuria.  She thinks she probably passed her stone, but she has not seen it pass.  We discussed options including observation moving forward, or renal ultrasound or CT to confirm stone passage.  KUB not a good option as stone likely uric acid based on urine pH and stone density.  She would like to avoid any imaging secondary to cost.  Return precautions discussed extensively.  She would like to complete a 24-hour urine test, will order this and she can follow-up with results  [place], [place] [address] [place], [place] [address] [phone]

## 2020-09-20 ENCOUNTER — Other Ambulatory Visit: Payer: Self-pay

## 2020-09-24 ENCOUNTER — Encounter: Payer: Self-pay | Admitting: Obstetrics and Gynecology

## 2020-09-26 ENCOUNTER — Other Ambulatory Visit
Admission: RE | Admit: 2020-09-26 | Discharge: 2020-09-26 | Disposition: A | Discharge status: Home / Self Care | Payer: 59 | Attending: Urology | Admitting: Urology

## 2020-09-26 ENCOUNTER — Other Ambulatory Visit: Payer: 59

## 2020-09-26 ENCOUNTER — Other Ambulatory Visit: Payer: Self-pay

## 2020-09-27 ENCOUNTER — Other Ambulatory Visit: Payer: Self-pay

## 2020-09-27 ENCOUNTER — Ambulatory Visit: Payer: 59 | Admitting: Adult Health

## 2020-09-27 ENCOUNTER — Encounter: Payer: Self-pay | Admitting: Adult Health

## 2020-09-27 VITALS — BP 122/82 | HR 90 | Temp 97.9°F | Ht 65.0 in | Wt 228.8 lb

## 2020-09-27 DIAGNOSIS — Z713 Dietary counseling and surveillance: Secondary | ICD-10-CM | POA: Diagnosis not present

## 2020-09-27 DIAGNOSIS — Z8669 Personal history of other diseases of the nervous system and sense organs: Secondary | ICD-10-CM

## 2020-09-27 DIAGNOSIS — Z1389 Encounter for screening for other disorder: Secondary | ICD-10-CM

## 2020-09-27 DIAGNOSIS — F339 Major depressive disorder, recurrent, unspecified: Secondary | ICD-10-CM

## 2020-09-27 MED ORDER — BUPROPION HCL ER (XL) 300 MG PO TB24: 300.0000 mg | ORAL_TABLET | Freq: Every day | ORAL | 1 refills | Status: DC

## 2020-09-27 MED ORDER — SUMATRIPTAN SUCCINATE 25 MG PO TABS: 25.0000 mg | ORAL_TABLET | Freq: Once | ORAL | 0 refills | Status: DC

## 2020-09-27 NOTE — Patient Instructions (Signed)
Bupropion extended-release tablets (Depression/Mood Disorders) What is this medicine? BUPROPION (byoo PROE pee on) is used to treat depression. This medicine may be used for other purposes; ask your health care provider or pharmacist if you have questions. COMMON BRAND NAME(S): Aplenzin, Budeprion XL, Forfivo XL, Wellbutrin XL What should I tell my health care provider before I take this medicine? They need to know if you have any of these conditions:  an eating disorder, such as anorexia or bulimia  bipolar disorder or psychosis  diabetes or high blood sugar, treated with medication  glaucoma  head injury or brain tumor  heart disease, previous heart attack, or irregular heart beat  high blood pressure  kidney or liver disease  seizures (convulsions)  suicidal thoughts or a previous suicide attempt  Tourette's syndrome  weight loss  an unusual or allergic reaction to bupropion, other medicines, foods, dyes, or preservatives  breast-feeding  pregnant or trying to become pregnant How should I use this medicine? Take this medicine by mouth with a glass of water. Follow the directions on the prescription label. You can take it with or without food. If it upsets your stomach, take it with food. Do not crush, chew, or cut these tablets. This medicine is taken once daily at the same time each day. Do not take your medicine more often than directed. Do not stop taking this medicine suddenly except upon the advice of your doctor. Stopping this medicine too quickly may cause serious side effects or your condition may worsen. A special MedGuide will be given to you by the pharmacist with each prescription and refill. Be sure to read this information carefully each time. Talk to your pediatrician regarding the use of this medicine in children. Special care may be needed. Overdosage: If you think you have taken too much of this medicine contact a poison control center or emergency room  at once. NOTE: This medicine is only for you. Do not share this medicine with others. What if I miss a dose? If you miss a dose, skip the missed dose and take your next tablet at the regular time. Do not take double or extra doses. What may interact with this medicine? Do not take this medicine with any of the following medications:  linezolid  MAOIs like Azilect, Carbex, Eldepryl, Marplan, Nardil, and Parnate  methylene blue (injected into a vein)  other medicines that contain bupropion like Zyban This medicine may also interact with the following medications:  alcohol  certain medicines for anxiety or sleep  certain medicines for blood pressure like metoprolol, propranolol  certain medicines for depression or psychotic disturbances  certain medicines for HIV or AIDS like efavirenz, lopinavir, nelfinavir, ritonavir  certain medicines for irregular heart beat like propafenone, flecainide  certain medicines for Parkinson's disease like amantadine, levodopa  certain medicines for seizures like carbamazepine, phenytoin, phenobarbital  cimetidine  clopidogrel  cyclophosphamide  digoxin  furazolidone  isoniazid  nicotine  orphenadrine  procarbazine  steroid medicines like prednisone or cortisone  stimulant medicines for attention disorders, weight loss, or to stay awake  tamoxifen  theophylline  thiotepa  ticlopidine  tramadol  warfarin This list may not describe all possible interactions. Give your health care provider a list of all the medicines, herbs, non-prescription drugs, or dietary supplements you use. Also tell them if you smoke, drink alcohol, or use illegal drugs. Some items may interact with your medicine. What should I watch for while using this medicine? Tell your doctor if your symptoms do   not get better or if they get worse. Visit your doctor or healthcare provider for regular checks on your progress. Because it may take several weeks to  see the full effects of this medicine, it is important to continue your treatment as prescribed by your doctor. This medicine may cause serious skin reactions. They can happen weeks to months after starting the medicine. Contact your healthcare provider right away if you notice fevers or flu-like symptoms with a rash. The rash may be red or purple and then turn into blisters or peeling of the skin. Or, you might notice a red rash with swelling of the face, lips or lymph nodes in your neck or under your arms. Patients and their families should watch out for new or worsening thoughts of suicide or depression. Also watch out for sudden changes in feelings such as feeling anxious, agitated, panicky, irritable, hostile, aggressive, impulsive, severely restless, overly excited and hyperactive, or not being able to sleep. If this happens, especially at the beginning of treatment or after a change in dose, call your healthcare provider. Avoid alcoholic drinks while taking this medicine. Drinking large amounts of alcoholic beverages, using sleeping or anxiety medicines, or quickly stopping the use of these agents while taking this medicine may increase your risk for a seizure. Do not drive or use heavy machinery until you know how this medicine affects you. This medicine can impair your ability to perform these tasks. Do not take this medicine close to bedtime. It may prevent you from sleeping. Your mouth may get dry. Chewing sugarless gum or sucking hard candy, and drinking plenty of water may help. Contact your doctor if the problem does not go away or is severe. The tablet shell for some brands of this medicine does not dissolve. This is normal. The tablet shell may appear whole in the stool. This is not a cause for concern. What side effects may I notice from receiving this medicine? Side effects that you should report to your doctor or health care professional as soon as possible:  allergic reactions like  skin rash, itching or hives, swelling of the face, lips, or tongue  breathing problems  changes in vision  confusion  elevated mood, decreased need for sleep, racing thoughts, impulsive behavior  fast or irregular heartbeat  hallucinations, loss of contact with reality  increased blood pressure  rash, fever, and swollen lymph nodes  redness, blistering, peeling or loosening of the skin, including inside the mouth  seizures  suicidal thoughts or other mood changes  unusually weak or tired  vomiting Side effects that usually do not require medical attention (report to your doctor or health care professional if they continue or are bothersome):  constipation  headache  loss of appetite  nausea  tremors  weight loss This list may not describe all possible side effects. Call your doctor for medical advice about side effects. You may report side effects to FDA at 1-800-FDA-1088. Where should I keep my medicine? Keep out of the reach of children. Store at room temperature between 15 and 30 degrees C (59 and 86 degrees F). Throw away any unused medicine after the expiration date. NOTE: This sheet is a summary. It may not cover all possible information. If you have questions about this medicine, talk to your doctor, pharmacist, or health care provider.  2021 Elsevier/Gold Standard (2018-08-14 13:45:31) Mediterranean Diet A Mediterranean diet refers to food and lifestyle choices that are based on the traditions of countries located on the [place]. This way of eating has been shown to help prevent certain conditions and improve outcomes for people who have chronic diseases, like kidney disease and heart disease. What are tips for following this plan? Lifestyle  Cook and eat meals together with your family, when possible.  Drink enough fluid to keep your urine clear or pale yellow.  Be physically active every day. This includes: ? Aerobic exercise like running  or swimming. ? Leisure activities like gardening, walking, or housework.  Get 7-8 hours of sleep each night.  If recommended by your health care provider, drink red wine in moderation. This means 1 glass a day for nonpregnant women and 2 glasses a day for men. A glass of wine equals 5 oz (150 mL). Reading food labels  Check the serving size of packaged foods. For foods such as rice and pasta, the serving size refers to the amount of cooked product, not dry.  Check the total fat in packaged foods. Avoid foods that have saturated fat or trans fats.  Check the ingredients list for added sugars, such as corn syrup.   Shopping  At the grocery store, buy most of your food from the areas near the walls of the store. This includes: ? Fresh fruits and vegetables (produce). ? Grains, beans, nuts, and seeds. Some of these may be available in unpackaged forms or large amounts (in bulk). ? Fresh seafood. ? Poultry and eggs. ? Low-fat dairy products.  Buy whole ingredients instead of prepackaged foods.  Buy fresh fruits and vegetables in-season from local farmers markets.  Buy frozen fruits and vegetables in resealable bags.  If you do not have access to quality fresh seafood, buy precooked frozen shrimp or canned fish, such as tuna, salmon, or sardines.  Buy small amounts of raw or cooked vegetables, salads, or olives from the deli or salad bar at your store.  Stock your pantry so you always have certain foods on hand, such as olive oil, canned tuna, canned tomatoes, rice, pasta, and beans. Cooking  Cook foods with extra-virgin olive oil instead of using butter or other vegetable oils.  Have meat as a side dish, and have vegetables or grains as your main dish. This means having meat in small portions or adding small amounts of meat to foods like pasta or stew.  Use beans or vegetables instead of meat in common dishes like chili or lasagna.  Experiment with different cooking methods. Try  roasting or broiling vegetables instead of steaming or sauteing them.  Add frozen vegetables to soups, stews, pasta, or rice.  Add nuts or seeds for added healthy fat at each meal. You can add these to yogurt, salads, or vegetable dishes.  Marinate fish or vegetables using olive oil, lemon juice, garlic, and fresh herbs. Meal planning  Plan to eat 1 vegetarian meal one day each week. Try to work up to 2 vegetarian meals, if possible.  Eat seafood 2 or more times a week.  Have healthy snacks readily available, such as: ? Vegetable sticks with hummus. ? [place] yogurt. ? Fruit and nut trail mix.  Eat balanced meals throughout the week. This includes: ? Fruit: 2-3 servings a day ? Vegetables: 4-5 servings a day ? Low-fat dairy: 2 servings a day ? Fish, poultry, or lean meat: 1 serving a day ? Beans and legumes: 2 or more servings a week ? Nuts and seeds: 1-2 servings a day ? Whole grains: 6-8 servings a day ? Extra-virgin olive oil: 3-4 servings a  day  Limit red meat and sweets to only a few servings a month   What are my food choices?  Mediterranean diet ? Recommended  Grains: Whole-grain pasta. Brown rice. Bulgar wheat. Polenta. Couscous. Whole-wheat bread. [place].  Vegetables: Artichokes. Beets. Broccoli. Cabbage. Carrots. Eggplant. Green beans. Chard. Kale. Spinach. Onions. Leeks. Peas. Squash. Tomatoes. Peppers. Radishes.  Fruits: Apples. Apricots. Avocado. Berries. Bananas. Cherries. Dates. Figs. Grapes. Lemons. Melon. Oranges. Peaches. Plums. Pomegranate.  Meats and other protein foods: Beans. Almonds. Sunflower seeds. Pine nuts. Peanuts. [place]. Salmon. Scallops. Shrimp. [place]. Tilapia. Clams. Oysters. Eggs.  Dairy: Low-fat milk. Cheese. Greek yogurt.  Beverages: Water. Red wine. Herbal tea.  Fats and oils: Extra virgin olive oil. Avocado oil. Grape seed oil.  Sweets and desserts: [place] yogurt with honey. Baked apples. Poached pears. Trail mix.  Seasoning  and other foods: Basil. Cilantro. Coriander. Cumin. Mint. Parsley. Sage. Rosemary. Tarragon. Garlic. Oregano. Thyme. Pepper. Balsalmic vinegar. Tahini. Hummus. Tomato sauce. Olives. Mushrooms. ? Limit these  Grains: Prepackaged pasta or rice dishes. Prepackaged cereal with added sugar.  Vegetables: Deep fried potatoes (french fries).  Fruits: Fruit canned in syrup.  Meats and other protein foods: Beef. Pork. Lamb. Poultry with skin. Hot dogs. [place] Salines.  Dairy: Ice cream. Sour cream. Whole milk.  Beverages: Juice. Sugar-sweetened soft drinks. Beer. Liquor and spirits.  Fats and oils: Butter. Canola oil. Vegetable oil. Beef fat (tallow). Lard.  Sweets and desserts: Cookies. Cakes. Pies. Candy.  Seasoning and other foods: Mayonnaise. Premade sauces and marinades. The items listed may not be a complete list. Talk with your dietitian about what dietary choices are right for you. Summary  The Mediterranean diet includes both food and lifestyle choices.  Eat a variety of fresh fruits and vegetables, beans, nuts, seeds, and whole grains.  Limit the amount of red meat and sweets that you eat.  Talk with your health care provider about whether it is safe for you to drink red wine in moderation. This means 1 glass a day for nonpregnant women and 2 glasses a day for men. A glass of wine equals 5 oz (150 mL). This information is not intended to replace advice given to you by your health care provider. Make sure you discuss any questions you have with your health care provider. Document Revised: 01/19/2016 Document Reviewed: 01/12/2016 Elsevier Patient Education  [place]. Sumatriptan tablets What is this medicine? SUMATRIPTAN (soo ma TRIP tan) is used to treat migraines with or without aura. An aura is a strange feeling or visual disturbance that warns you of an attack. It is not used to prevent migraines. This medicine may be used for other purposes; ask your health care provider or  pharmacist if you have questions. COMMON BRAND NAME(S): Imitrex, Migraine Pack What should I tell my health care provider before I take this medicine? They need to know if you have any of these conditions:  cigarette smoker  circulation problems in fingers and toes  diabetes  heart disease  high blood pressure  high cholesterol  history of irregular heartbeat  history of stroke  kidney disease  liver disease  stomach or intestine problems  an unusual or allergic reaction to sumatriptan, other medicines, foods, dyes, or preservatives  pregnant or trying to get pregnant  breast-feeding How should I use this medicine? Take this medicine by mouth with a glass of water. Follow the directions on the prescription label. Do not take it more often than directed. Talk to your pediatrician regarding the use  of this medicine in children. Special care may be needed. Overdosage: If you think you have taken too much of this medicine contact a poison control center or emergency room at once. NOTE: This medicine is only for you. Do not share this medicine with others. What if I miss a dose? This does not apply. This medicine is not for regular use. What may interact with this medicine? Do not take this medicine with any of the following medicines:  certain medicines for migraine headache like almotriptan, eletriptan, frovatriptan, naratriptan, rizatriptan, sumatriptan, zolmitriptan  ergot alkaloids like dihydroergotamine, ergonovine, ergotamine, methylergonovine  MAOIs like Carbex, Eldepryl, Marplan, Nardil, and Parnate This medicine may also interact with the following medications:  certain medicines for depression, anxiety, or psychotic disorders This list may not describe all possible interactions. Give your health care provider a list of all the medicines, herbs, non-prescription drugs, or dietary supplements you use. Also tell them if you smoke, drink alcohol, or use illegal  drugs. Some items may interact with your medicine. What should I watch for while using this medicine? Visit your healthcare professional for regular checks on your progress. Tell your healthcare professional if your symptoms do not start to get better or if they get worse. You may get drowsy or dizzy. Do not drive, use machinery, or do anything that needs mental alertness until you know how this medicine affects you. Do not stand up or sit up quickly, especially if you are an older patient. This reduces the risk of dizzy or fainting spells. Alcohol may interfere with the effect of this medicine. Tell your healthcare professional right away if you have any change in your eyesight. If you take migraine medicines for 10 or more days a month, your migraines may get worse. Keep a diary of headache days and medicine use. Contact your healthcare professional if your migraine attacks occur more frequently. What side effects may I notice from receiving this medicine? Side effects that you should report to your doctor or health care professional as soon as possible:  allergic reactions like skin rash, itching or hives, swelling of the face, lips, or tongue  changes in vision  chest pain or chest tightness  signs and symptoms of a dangerous change in heartbeat or heart rhythm like chest pain; dizziness; fast, irregular heartbeat; palpitations; feeling faint or lightheaded; falls; breathing problems  signs and symptoms of a stroke like changes in vision; confusion; trouble speaking or understanding; severe headaches; sudden numbness or weakness of the face, arm or leg; trouble walking; dizziness; loss of balance or coordination  signs and symptoms of serotonin syndrome like irritable; confusion; diarrhea; fast or irregular heartbeat; muscle twitching; stiff muscles; trouble walking; sweating; high fever; seizures; chills; vomiting Side effects that usually do not require medical attention (report to your  doctor or health care professional if they continue or are bothersome):  diarrhea  dizziness  drowsiness  dry mouth  headache  nausea, vomiting  pain, tingling, numbness in the hands or feet  stomach pain This list may not describe all possible side effects. Call your doctor for medical advice about side effects. You may report side effects to FDA at 1-800-FDA-1088. Where should I keep my medicine? Keep out of the reach of children. Store at room temperature between 2 and 30 degrees C (36 and 86 degrees F). Throw away any unused medicine after the expiration date. NOTE: This sheet is a summary. It may not cover all possible information. If you have questions about this  medicine, talk to your doctor, pharmacist, or health care provider.  2021 Elsevier/Gold Standard (2017-12-03 15:05:37) Migraine Headache A migraine headache is a very strong throbbing pain on one side or both sides of your head. This type of headache can also cause other symptoms. It can last from 4 hours to 3 days. Talk with your doctor about what things may bring on (trigger) this condition. What are the causes? The exact cause of this condition is not known. This condition may be triggered or caused by:  Drinking alcohol.  Smoking.  Taking medicines, such as: ? Medicine used to treat chest pain (nitroglycerin). ? Birth control pills. ? Estrogen. ? Some blood pressure medicines.  Eating or drinking certain products.  Doing physical activity. Other things that may trigger a migraine headache include:  Having a menstrual period.  Pregnancy.  Hunger.  Stress.  Not getting enough sleep or getting too much sleep.  Weather changes.  Tiredness (fatigue). What increases the risk?  Being 8-54 years old.  Being female.  Having a family history of migraine headaches.  Being Caucasian.  Having depression or anxiety.  Being very overweight. What are the signs or symptoms?  A throbbing pain.  This pain may: ? Happen in any area of the head, such as on one side or both sides. ? Make it hard to do daily activities. ? Get worse with physical activity. ? Get worse around bright lights or loud noises.  Other symptoms may include: ? Feeling sick to your stomach (nauseous). ? Vomiting. ? Dizziness. ? Being sensitive to bright lights, loud noises, or smells.  Before you get a migraine headache, you may get warning signs (an aura). An aura may include: ? Seeing flashing lights or having blind spots. ? Seeing bright spots, halos, or zigzag lines. ? Having tunnel vision or blurred vision. ? Having numbness or a tingling feeling. ? Having trouble talking. ? Having weak muscles.  Some people have symptoms after a migraine headache (postdromal phase), such as: ? Tiredness. ? Trouble thinking (concentrating). How is this treated?  Taking medicines that: ? Relieve pain. ? Relieve the feeling of being sick to your stomach. ? Prevent migraine headaches.  Treatment may also include: ? Having acupuncture. ? Avoiding foods that bring on migraine headaches. ? Learning ways to control your body functions (biofeedback). ? Therapy to help you know and deal with negative thoughts (cognitive behavioral therapy). Follow these instructions at home: Medicines  Take over-the-counter and prescription medicines only as told by your doctor.  Ask your doctor if the medicine prescribed to you: ? Requires you to avoid driving or using heavy machinery. ? Can cause trouble pooping (constipation). You may need to take these steps to prevent or treat trouble pooping:  Drink enough fluid to keep your pee (urine) pale yellow.  Take over-the-counter or prescription medicines.  Eat foods that are high in fiber. These include beans, whole grains, and fresh fruits and vegetables.  Limit foods that are high in fat and sugar. These include fried or sweet foods. Lifestyle  Do not drink alcohol.  Do  not use any products that contain nicotine or tobacco, such as cigarettes, e-cigarettes, and chewing tobacco. If you need help quitting, ask your doctor.  Get at least 8 hours of sleep every night.  Limit and deal with stress. General instructions  Keep a journal to find out what may bring on your migraine headaches. For example, write down: ? What you eat and drink. ? How much sleep  you get. ? Any change in what you eat or drink. ? Any change in your medicines.  If you have a migraine headache: ? Avoid things that make your symptoms worse, such as bright lights. ? It may help to lie down in a dark, quiet room. ? Do not drive or use heavy machinery. ? Ask your doctor what activities are safe for you.  Keep all follow-up visits as told by your doctor. This is important.      Contact a doctor if:  You get a migraine headache that is different or worse than others you have had.  You have more than 15 headache days in one month. Get help right away if:  Your migraine headache gets very bad.  Your migraine headache lasts longer than 72 hours.  You have a fever.  You have a stiff neck.  You have trouble seeing.  Your muscles feel weak or like you cannot control them.  You start to lose your balance a lot.  You start to have trouble walking.  You pass out (faint).  You have a seizure. Summary  A migraine headache is a very strong throbbing pain on one side or both sides of your head. These headaches can also cause other symptoms.  This condition may be treated with medicines and changes to your lifestyle.  Keep a journal to find out what may bring on your migraine headaches.  Contact a doctor if you get a migraine headache that is different or worse than others you have had.  Contact your doctor if you have more than 15 headache days in a month. This information is not intended to replace advice given to you by your health care provider. Make sure you discuss any  questions you have with your health care provider. Document Revised: 09/12/2018 Document Reviewed: 07/03/2018 Elsevier Patient Education  [place].

## 2020-09-27 NOTE — Progress Notes (Signed)
New Patient Office Visit  Subjective:  Patient ID: Laura Wiggins, female    DOB: [birth year]  Age: 35 y.o. MRN: [id number]  CC:  Chief Complaint  Patient presents with  . New Patient (Initial Visit)    Pt c/o Depression and wants medication Knee Pain  . Headache  . Weight Loss    HPI Laura Wiggins presents for  For care, she has missed some care due to lost insurance.   She has had some depression symptoms was previously on medication and stopped when she lost her insurance. She has had depression since age 49.   She is concerns with her weight gain. Eating - trying to eat well, but know has room for improvement. SHe was on phentermine in the past.    History of migraines, and had Imitrex in past for migraines and it worked well. She denied any change in her current headache to her previous. No aura.  No LMP recorded.- LMP one week ago. Regular.   Patient  denies any fever, body aches,chills, rash, chest pain, shortness of breath, nausea, vomiting, or diarrhea.  Denies dizziness, lightheadedness, pre syncopal or syncopal episodes.      Past Medical History:  Diagnosis Date  . Cancer ([place])    Melanoma  . Chicken pox   . COVID-19 07/29/2019   diagnosed 07/29/2019 according to the patient  . Depression   . Endometriosis 2004  . Frequent headaches   . Hx of migraines   . Interstitial cystitis 2004  . Kidney stones   . Urinary incontinence     Past Surgical History:  Procedure Laterality Date  . CESAREAN SECTION N/A 11/14/2017   Procedure: CESAREAN SECTION;  Surgeon: Homero Fellers, MD;  Location: [facility] ORS;  Service: Obstetrics;  Laterality: N/A;  . TONSILLECTOMY AND ADENOIDECTOMY  2013    Family History  Problem Relation Age of Onset  . Hyperlipidemia Mother   . Hypertension Mother   . Diabetes Mother     Social History   Socioeconomic History  . Marital status: Single    Spouse name: Not on file  . Number of children: Not on file  . Years of  education: Not on file  . Highest education level: Not on file  Occupational History  . Not on file  Tobacco Use  . Smoking status: Former Research scientist (life sciences)  . Smokeless tobacco: Never Used  Vaping Use  . Vaping Use: Never used  Substance and Sexual Activity  . Alcohol use: Not Currently    Alcohol/week: 0.0 standard drinks    Comment: occasioanlly  . Drug use: No  . Sexual activity: Yes    Birth control/protection: None  Other Topics Concern  . Not on file  Social History Narrative  . Not on file   Social Determinants of Health   Financial Resource Strain: Not on file  Food Insecurity: Not on file  Transportation Needs: Not on file  Physical Activity: Not on file  Stress: Not on file  Social Connections: Not on file  Intimate Partner Violence: Not on file    ROS Review of Systems  Respiratory: Negative.   Gastrointestinal: Negative.   Genitourinary: Negative.   Musculoskeletal: Negative.   Neurological: Positive for headaches. Negative for dizziness, tremors, seizures, syncope, facial asymmetry, speech difficulty, weakness, light-headedness and numbness.  Psychiatric/Behavioral: Negative for self-injury, sleep disturbance and suicidal ideas. The patient is nervous/anxious.     Objective:   Today's Vitals: BP 122/82   Pulse 90   Temp  97.9 F (36.6 C)   Ht 5\' 5"  (1.651 m)   Wt 228 lb 12.8 oz (103.8 kg)   SpO2 98%   BMI 38.07 kg/m   Physical Exam Vitals reviewed.  Constitutional:      General: She is not in acute distress.    Appearance: She is well-developed. She is obese. She is not ill-appearing.  Cardiovascular:     Rate and Rhythm: Normal rate and regular rhythm.     Heart sounds: Normal heart sounds. No murmur heard. No friction rub. No gallop.   Pulmonary:     Effort: Pulmonary effort is normal.     Breath sounds: Normal breath sounds.  Abdominal:     General: Bowel sounds are normal.     Palpations: Abdomen is soft.  Musculoskeletal:        General:  Normal range of motion.  Skin:    General: Skin is warm.  Psychiatric:        Mood and Affect: Mood normal. Mood is not anxious or depressed.        Speech: Speech normal.        Behavior: Behavior normal. Behavior is not agitated.        Cognition and Memory: Cognition is not impaired. Memory is not impaired.     Assessment & Plan:   Problem List Items Addressed This Visit   None   Visit Diagnoses    Depression, recurrent ([place])    -  Primary   Relevant Medications   buPROPion (WELLBUTRIN XL) 300 MG 24 hr tablet   Other Relevant Orders   CBC with Differential/Platelet   Comprehensive metabolic panel   TSH   Lipid panel   Encounter for weight loss counseling       Relevant Orders   TSH   Screening for blood or protein in urine       Relevant Orders   Urinalysis, microscopic only   History of migraine       Relevant Medications   SUMAtriptan (IMITREX) 25 MG tablet      Will refill Imitrex if having more than 5 headaches per month or any red flag syymptoms as discussed will need neurology.   Will start her back on Wellbutriin as this did well for her in the past. Recommend on week one to take dose every other day and then increase to full dose as prescribed.   Discussed known black box warning for anti depression/ anxiety medication. Need to report any behavioral changes right, if any homicidal or suicidal thoughts or ideas seek medical attention right away. Call 911.    Discussed known black box warning for anti depression/ anxiety medication. Need to report any behavioral changes right, if any homicidal or suicidal thoughts or ideas seek medical attention right away. Call 911.   Concerned with weight discussed diet and exercise lifestyle changes.    Red Flags discussed. The patient was given clear instructions to go to ER or return to medical center if any red flags develop, symptoms do not improve, worsen or new problems develop. They verbalized  understanding.   Outpatient Encounter Medications as of 09/27/2020  Medication Sig  . Multiple Vitamin (MULTIVITAMIN) capsule Take 1 capsule by mouth daily.  Marland Kitchen buPROPion (WELLBUTRIN XL) 300 MG 24 hr tablet Take 1 tablet (300 mg total) by mouth daily.  . SUMAtriptan (IMITREX) 25 MG tablet Take 1 tablet (25 mg total) by mouth once for 1 dose. May repeat in 2 hours if headache persists or  recurs.   No facility-administered encounter medications on file as of 09/27/2020.    Follow-up: Return in about 1 month (around 10/27/2020), or if symptoms worsen or fail to improve, for at any time for any worsening symptoms, Go to Emergency room/ urgent care if worse.   Marcille Buffy, FNP

## 2020-09-30 ENCOUNTER — Other Ambulatory Visit: Payer: Self-pay | Admitting: Urology

## 2020-10-07 ENCOUNTER — Other Ambulatory Visit: Payer: Self-pay

## 2020-10-07 ENCOUNTER — Other Ambulatory Visit (INDEPENDENT_AMBULATORY_CARE_PROVIDER_SITE_OTHER): Payer: 59

## 2020-10-07 DIAGNOSIS — Z1389 Encounter for screening for other disorder: Secondary | ICD-10-CM | POA: Diagnosis not present

## 2020-10-07 DIAGNOSIS — F339 Major depressive disorder, recurrent, unspecified: Secondary | ICD-10-CM | POA: Diagnosis not present

## 2020-10-07 DIAGNOSIS — Z713 Dietary counseling and surveillance: Secondary | ICD-10-CM

## 2020-10-07 LAB — CBC WITH DIFFERENTIAL/PLATELET
Basophils Absolute: 0 10*3/uL (ref 0.0–0.1)
Basophils Relative: 0.7 % (ref 0.0–3.0)
Eosinophils Absolute: 0.1 10*3/uL (ref 0.0–0.7)
Eosinophils Relative: 2 % (ref 0.0–5.0)
HCT: 34.9 % — ABNORMAL LOW (ref 36.0–46.0)
Hemoglobin: 12 g/dL (ref 12.0–15.0)
Lymphocytes Relative: 23.1 % (ref 12.0–46.0)
Lymphs Abs: 1.2 10*3/uL (ref 0.7–4.0)
MCHC: 34.5 g/dL (ref 30.0–36.0)
MCV: 87.5 fl (ref 78.0–100.0)
Monocytes Absolute: 0.5 10*3/uL (ref 0.1–1.0)
Monocytes Relative: 9.2 % (ref 3.0–12.0)
Neutro Abs: 3.3 10*3/uL (ref 1.4–7.7)
Neutrophils Relative %: 65 % (ref 43.0–77.0)
Platelets: 226 10*3/uL (ref 150.0–400.0)
RBC: 3.99 Mil/uL (ref 3.87–5.11)
RDW: 13.7 % (ref 11.5–15.5)
WBC: 5.1 10*3/uL (ref 4.0–10.5)

## 2020-10-07 LAB — COMPREHENSIVE METABOLIC PANEL
ALT: 14 U/L (ref 0–35)
AST: 11 U/L (ref 0–37)
Albumin: 4.2 g/dL (ref 3.5–5.2)
Alkaline Phosphatase: 29 U/L — ABNORMAL LOW (ref 39–117)
BUN: 12 mg/dL (ref 6–23)
CO2: 25 mEq/L (ref 19–32)
Calcium: 9 mg/dL (ref 8.4–10.5)
Chloride: 106 mEq/L (ref 96–112)
Creatinine, Ser: 0.84 mg/dL (ref 0.40–1.20)
GFR: 89.17 mL/min (ref >60.00)
Glucose, Bld: 97 mg/dL (ref 70–99)
Potassium: 3.7 mEq/L (ref 3.5–5.1)
Sodium: 140 mEq/L (ref 135–145)
Total Bilirubin: 0.5 mg/dL (ref 0.2–1.2)
Total Protein: 6.1 g/dL (ref 6.0–8.3)

## 2020-10-07 LAB — URINALYSIS, MICROSCOPIC ONLY

## 2020-10-07 LAB — LIPID PANEL
Cholesterol: 218 mg/dL — ABNORMAL HIGH (ref 0–200)
HDL: 42.2 mg/dL (ref >39.00)
LDL Cholesterol: 142 mg/dL — ABNORMAL HIGH (ref 0–99)
NonHDL: 175.75
Total CHOL/HDL Ratio: 5
Triglycerides: 168 mg/dL — ABNORMAL HIGH (ref 0.0–149.0)
VLDL: 33.6 mg/dL (ref 0.0–40.0)

## 2020-10-07 LAB — TSH: TSH: 2.1 u[IU]/mL (ref 0.35–4.50)

## 2020-10-10 NOTE — Progress Notes (Signed)
CMP within normal limits.  CBC, shows slightly low hematocrit would advise taking a daily vitamin for women with iron.  TSH within normal limits.  Few bacteria seen in urine microscopic if she has any urinary symptoms she should have urine culture.   Total cholesterol and LDL elevated.  Discuss lifestyle modification with patient e.g. increase exercise, fiber, fruits, vegetables, lean meat, and omega 3/fish intake and decrease saturated fat.  If patient following strict diet and exercise program already please schedule follow up appointment with primary care physician

## 2020-10-21 ENCOUNTER — Other Ambulatory Visit: Payer: Self-pay | Admitting: Adult Health

## 2020-10-25 ENCOUNTER — Ambulatory Visit (INDEPENDENT_AMBULATORY_CARE_PROVIDER_SITE_OTHER): Payer: 59 | Admitting: Urology

## 2020-10-25 ENCOUNTER — Other Ambulatory Visit: Payer: Self-pay

## 2020-10-25 ENCOUNTER — Encounter: Payer: Self-pay | Admitting: Urology

## 2020-10-25 DIAGNOSIS — B9689 Other specified bacterial agents as the cause of diseases classified elsewhere: Secondary | ICD-10-CM | POA: Insufficient documentation

## 2020-10-25 DIAGNOSIS — N3010 Interstitial cystitis (chronic) without hematuria: Secondary | ICD-10-CM | POA: Insufficient documentation

## 2020-10-25 DIAGNOSIS — K219 Gastro-esophageal reflux disease without esophagitis: Secondary | ICD-10-CM | POA: Insufficient documentation

## 2020-10-25 DIAGNOSIS — L568 Other specified acute skin changes due to ultraviolet radiation: Secondary | ICD-10-CM

## 2020-10-25 DIAGNOSIS — N3090 Cystitis, unspecified without hematuria: Secondary | ICD-10-CM | POA: Insufficient documentation

## 2020-10-25 DIAGNOSIS — N393 Stress incontinence (female) (male): Secondary | ICD-10-CM | POA: Insufficient documentation

## 2020-10-25 DIAGNOSIS — N6019 Diffuse cystic mastopathy of unspecified breast: Secondary | ICD-10-CM | POA: Insufficient documentation

## 2020-10-25 DIAGNOSIS — R102 Pelvic and perineal pain: Secondary | ICD-10-CM

## 2020-10-25 DIAGNOSIS — N946 Dysmenorrhea, unspecified: Secondary | ICD-10-CM | POA: Insufficient documentation

## 2020-10-25 DIAGNOSIS — M25539 Pain in unspecified wrist: Secondary | ICD-10-CM

## 2020-10-25 DIAGNOSIS — N3941 Urge incontinence: Secondary | ICD-10-CM

## 2020-10-25 DIAGNOSIS — IMO0002 Reserved for concepts with insufficient information to code with codable children: Secondary | ICD-10-CM | POA: Insufficient documentation

## 2020-10-25 DIAGNOSIS — N200 Calculus of kidney: Secondary | ICD-10-CM | POA: Diagnosis not present

## 2020-10-25 DIAGNOSIS — R42 Dizziness and giddiness: Secondary | ICD-10-CM

## 2020-10-25 DIAGNOSIS — R3915 Urgency of urination: Secondary | ICD-10-CM

## 2020-10-25 DIAGNOSIS — N809 Endometriosis, unspecified: Secondary | ICD-10-CM | POA: Insufficient documentation

## 2020-10-25 DIAGNOSIS — N930 Postcoital and contact bleeding: Secondary | ICD-10-CM

## 2020-10-25 DIAGNOSIS — N760 Acute vaginitis: Secondary | ICD-10-CM | POA: Insufficient documentation

## 2020-10-25 DIAGNOSIS — D259 Leiomyoma of uterus, unspecified: Secondary | ICD-10-CM | POA: Insufficient documentation

## 2020-10-25 DIAGNOSIS — N808 Other endometriosis: Secondary | ICD-10-CM | POA: Insufficient documentation

## 2020-10-25 DIAGNOSIS — F17200 Nicotine dependence, unspecified, uncomplicated: Secondary | ICD-10-CM | POA: Insufficient documentation

## 2020-10-25 HISTORY — DX: Postcoital and contact bleeding: N93.0

## 2020-10-25 HISTORY — DX: Urgency of urination: R39.15

## 2020-10-25 HISTORY — DX: Other specified bacterial agents as the cause of diseases classified elsewhere: B96.89

## 2020-10-25 HISTORY — DX: Stress incontinence (female) (male): N39.3

## 2020-10-25 HISTORY — DX: Dizziness and giddiness: R42

## 2020-10-25 HISTORY — DX: Pelvic and perineal pain: R10.2

## 2020-10-25 HISTORY — DX: Cystitis, unspecified without hematuria: N30.90

## 2020-10-25 HISTORY — DX: Diffuse cystic mastopathy of unspecified breast: N60.19

## 2020-10-25 HISTORY — DX: Urge incontinence: N39.41

## 2020-10-25 HISTORY — DX: Other specified acute skin changes due to ultraviolet radiation: L56.8

## 2020-10-25 HISTORY — DX: Pain in unspecified wrist: M25.539

## 2020-10-25 MED ORDER — POTASSIUM CITRATE ER 15 MEQ (1620 MG) PO TBCR: 1.0000 | EXTENDED_RELEASE_TABLET | Freq: Two times a day (BID) | ORAL | 11 refills | Status: DC

## 2020-10-25 NOTE — Patient Instructions (Signed)
Based on your 24-hour urine test, the most likely reason you are forming so many stones is high salt in the urine.  This is pulling extra calcium into the urine and forming stones.  Continue to drink plenty of fluids with goal of 2.5 L of urine output a day.  Avoid dark sodas.  Lemon juice and Crystal light lemonade can be helpful in preventing stones.  We will also start a medication called potassium citrate that will help prevent stones.    Dietary Guidelines to Help Prevent Kidney Stones Kidney stones are deposits of minerals and salts that form inside your kidneys. Your risk of developing kidney stones may be greater depending on your diet, your lifestyle, the medicines you take, and whether you have certain medical conditions. Most people can lower their chances of developing kidney stones by following the instructions below. Your dietitian may give you more specific instructions depending on your overall health and the type of kidney stones you tend to develop. What are tips for following this plan? Reading food labels  Choose foods with "no salt added" or "low-salt" labels. Limit your salt (sodium) intake to less than 1,500 mg a day.  Choose foods with calcium for each meal and snack. Try to eat about 300 mg of calcium at each meal. Foods that contain 200-500 mg of calcium a serving include: ? 8 oz (237 mL) of milk, calcium-fortifiednon-dairy milk, and calcium-fortifiedfruit juice. Calcium-fortified means that calcium has been added to these drinks. ? 8 oz (237 mL) of kefir, yogurt, and soy yogurt. ? 4 oz (114 g) of tofu. ? 1 oz (28 g) of cheese. ? 1 cup (150 g) of dried figs. ? 1 cup (91 g) of cooked broccoli. ? One 3 oz (85 g) can of sardines or mackerel. Most people need 1,000-1,500 mg of calcium a day. Talk to your dietitian about how much calcium is recommended for you.   Shopping  Buy plenty of fresh fruits and vegetables. Most people do not need to avoid fruits and vegetables,  even if these foods contain nutrients that may contribute to kidney stones.  When shopping for convenience foods, choose: ? Whole pieces of fruit. ? Pre-made salads with dressing on the side. ? Low-fat fruit and yogurt smoothies.  Avoid buying frozen meals or prepared deli foods. These can be high in sodium.  Look for foods with live cultures, such as yogurt and kefir.  Choose high-fiber grains, such as whole-wheat breads, oat bran, and wheat cereals. Cooking  Do not add salt to food when cooking. Place a salt shaker on the table and allow each person to add his or her own salt to taste.  Use vegetable protein, such as beans, textured vegetable protein (TVP), or tofu, instead of meat in pasta, casseroles, and soups. Meal planning  Eat less salt, if told by your dietitian. To do this: ? Avoid eating processed or pre-made food. ? Avoid eating fast food.  Eat less animal protein, including cheese, meat, poultry, or fish, if told by your dietitian. To do this: ? Limit the number of times you have meat, poultry, fish, or cheese each week. Eat a diet free of meat at least 2 days a week. ? Eat only one serving each day of meat, poultry, fish, or seafood. ? When you prepare animal protein, cut pieces into small portion sizes. For most meat and fish, one serving is about the size of the palm of your hand.  Eat at least five servings of  fresh fruits and vegetables each day. To do this: ? Keep fruits and vegetables on hand for snacks. ? Eat one piece of fruit or a handful of berries with breakfast. ? Have a salad and fruit at lunch. ? Have two kinds of vegetables at dinner.  Limit foods that are high in a substance called oxalate. These include: ? Spinach (cooked), rhubarb, beets, sweet potatoes, and Swiss chard. ? Peanuts. ? Potato chips, french fries, and baked potatoes with skin on. ? Nuts and nut products. ? Chocolate.  If you regularly take a diuretic medicine, make sure to eat at  least 1 or 2 servings of fruits or vegetables that are high in potassium each day. These include: ? Avocado. ? Banana. ? Orange, prune, carrot, or tomato juice. ? Baked potato. ? Cabbage. ? Beans and split peas. Lifestyle  Drink enough fluid to keep your urine pale yellow. This is the most important thing you can do. Spread your fluid intake throughout the day.  If you drink alcohol: ? Limit how much you use to:  0-1 drink a day for women who are not pregnant.  0-2 drinks a day for men. ? Be aware of how much alcohol is in your drink. In the [place] one drink equals one 12 oz bottle of beer (355 mL), one 5 oz glass of wine (148 mL), or one 1 oz glass of hard liquor (44 mL).  Lose weight if told by your health care provider. Work with your dietitian to find an eating plan and weight loss strategies that work best for you.   General information  Talk to your health care provider and dietitian about taking daily supplements. You may be told the following depending on your health and the cause of your kidney stones: ? Not to take supplements with vitamin C. ? To take a calcium supplement. ? To take a daily probiotic supplement. ? To take other supplements such as magnesium, fish oil, or vitamin B6.  Take over-the-counter and prescription medicines only as told by your health care provider. These include supplements. What foods should I limit? Limit your intake of the following foods, or eat them as told by your dietitian. Vegetables Spinach. Rhubarb. Beets. Canned vegetables. Angie Fava. Olives. Baked potatoes with skin. Grains Wheat bran. Baked goods. Salted crackers. Cereals high in sugar. Meats and other proteins Nuts. Nut butters. Large portions of meat, poultry, or fish. Salted, precooked, or cured meats, such as sausages, meat loaves, and hot dogs. Dairy Cheese. Beverages Regular soft drinks. Regular vegetable juice. Seasonings and condiments Seasoning blends with salt. Salad  dressings. Soy sauce. Ketchup. Barbecue sauce. Other foods Canned soups. Canned pasta sauce. Casseroles. Pizza. Lasagna. Frozen meals. Potato chips. [place] fries. The items listed above may not be a complete list of foods and beverages you should limit. Contact a dietitian for more information. What foods should I avoid? Talk to your dietitian about specific foods you should avoid based on the type of kidney stones you have and your overall health. Fruits Grapefruit. The item listed above may not be a complete list of foods and beverages you should avoid. Contact a dietitian for more information. Summary  Kidney stones are deposits of minerals and salts that form inside your kidneys.  You can lower your risk of kidney stones by making changes to your diet.  The most important thing you can do is drink enough fluid. Drink enough fluid to keep your urine pale yellow.  Talk to your dietitian about how  much calcium you should have each day, and eat less salt and animal protein as told by your dietitian. This information is not intended to replace advice given to you by your health care provider. Make sure you discuss any questions you have with your health care provider. Document Revised: 05/14/2019 Document Reviewed: 05/14/2019 Elsevier Patient Education  2021 [employer].

## 2020-10-25 NOTE — Progress Notes (Signed)
   10/25/2020 12:57 PM   [name] 1986/05/11 [id number]  Reason for visit: Follow up nephrolithiasis, discuss 24-hour urine results  HPI: 35 year old female with at least 3 prior stone episodes, and strong family history of kidney stones who follows up to discuss 24-hour urine results.  Stone type is unknown, but possibly uric acid based on HU <500 and acidic urine pH of 5.5.  24-hour urine results notable for good urine volume of 2.3 L, elevated calcium of 331, elevated urine sodium of 194, normal urine oxalate of 21, excellent urine citrate of 595, low urine pH of 5.5.  We discussed general stone prevention strategies including adequate hydration with goal of producing 2.5 L of urine daily, increasing citric acid intake, increasing calcium intake during high oxalate meals, minimizing animal protein, and decreasing salt intake. Information about dietary recommendations given today.   Based on her 24-hour urine results, I recommended decreasing sodium in the diet to pull down the elevated urine calcium.  She is also interested in starting potassium citrate, and 15 mEq twice daily sent to pharmacy.  RTC 6 months with UA to check pH.   Billey Co, [facility] [address] [place], [place] [address] [phone]

## 2020-10-26 ENCOUNTER — Encounter: Payer: Self-pay | Admitting: Adult Health

## 2020-10-26 ENCOUNTER — Ambulatory Visit: Payer: 59 | Admitting: Adult Health

## 2020-10-26 ENCOUNTER — Other Ambulatory Visit: Payer: Self-pay | Admitting: Adult Health

## 2020-10-26 VITALS — BP 112/72 | HR 87 | Temp 98.0°F | Ht 65.0 in | Wt 225.2 lb

## 2020-10-26 DIAGNOSIS — G43809 Other migraine, not intractable, without status migrainosus: Secondary | ICD-10-CM

## 2020-10-26 DIAGNOSIS — R634 Abnormal weight loss: Secondary | ICD-10-CM

## 2020-10-26 DIAGNOSIS — Z8669 Personal history of other diseases of the nervous system and sense organs: Secondary | ICD-10-CM

## 2020-10-26 DIAGNOSIS — E559 Vitamin D deficiency, unspecified: Secondary | ICD-10-CM | POA: Diagnosis not present

## 2020-10-26 DIAGNOSIS — D509 Iron deficiency anemia, unspecified: Secondary | ICD-10-CM

## 2020-10-26 DIAGNOSIS — F339 Major depressive disorder, recurrent, unspecified: Secondary | ICD-10-CM

## 2020-10-26 LAB — IRON,TIBC AND FERRITIN PANEL
%SAT: 13 % (calc) — ABNORMAL LOW (ref 16–45)
Ferritin: 46 ng/mL (ref 16–154)
Iron: 40 ug/dL (ref 40–190)
TIBC: 318 mcg/dL (calc) (ref 250–450)

## 2020-10-26 MED ORDER — PHENTERMINE HCL 30 MG PO CAPS: 30.0000 mg | ORAL_CAPSULE | ORAL | 0 refills | Status: DC

## 2020-10-26 MED ORDER — BUPROPION HCL ER (XL) 300 MG PO TB24: 300.0000 mg | ORAL_TABLET | Freq: Every day | ORAL | 1 refills | Status: DC

## 2020-10-26 MED ORDER — SUMATRIPTAN SUCCINATE 25 MG PO TABS: 25.0000 mg | ORAL_TABLET | Freq: Once | ORAL | 0 refills | Status: DC

## 2020-10-26 NOTE — Patient Instructions (Signed)
Calorie Counting for Weight Loss Calories are units of energy. Your body needs a certain number of calories from food to keep going throughout the day. When you eat or drink more calories than your body needs, your body stores the extra calories mostly as fat. When you eat or drink fewer calories than your body needs, your body burns fat to get the energy it needs. Calorie counting means keeping track of how many calories you eat and drink each day. Calorie counting can be helpful if you need to lose weight. If you eat fewer calories than your body needs, you should lose weight. Ask your health care provider what a healthy weight is for you. For calorie counting to work, you will need to eat the right number of calories each day to lose a healthy amount of weight per week. A dietitian can help you figure out how many calories you need in a day and will suggest ways to reach your calorie goal.  A healthy amount of weight to lose each week is usually 1-2 lb (0.5-0.9 kg). This usually means that your daily calorie intake should be reduced by 500-750 calories.  Eating 1,200-1,500 calories a day can help most women lose weight.  Eating 1,500-1,800 calories a day can help most men lose weight. What do I need to know about calorie counting? Work with your health care provider or dietitian to determine how many calories you should get each day. To meet your daily calorie goal, you will need to:  Find out how many calories are in each food that you would like to eat. Try to do this before you eat.  Decide how much of the food you plan to eat.  Keep a food log. Do this by writing down what you ate and how many calories it had. To successfully lose weight, it is important to balance calorie counting with a healthy lifestyle that includes regular activity. Where do I find calorie information? The number of calories in a food can be found on a Nutrition Facts label. If a food does not have a Nutrition Facts  label, try to look up the calories online or ask your dietitian for help. Remember that calories are listed per serving. If you choose to have more than one serving of a food, you will have to multiply the calories per serving by the number of servings you plan to eat. For example, the label on a package of bread might say that a serving size is 1 slice and that there are 90 calories in a serving. If you eat 1 slice, you will have eaten 90 calories. If you eat 2 slices, you will have eaten 180 calories.   How do I keep a food log? After each time that you eat, record the following in your food log as soon as possible:  What you ate. Be sure to include toppings, sauces, and other extras on the food.  How much you ate. This can be measured in cups, ounces, or number of items.  How many calories were in each food and drink.  The total number of calories in the food you ate. Keep your food log near you, such as in a pocket-sized notebook or on an app or website on your mobile phone. Some programs will calculate calories for you and show you how many calories you have left to meet your daily goal. What are some portion-control tips?  Know how many calories are in a serving. This will   help you know how many servings you can have of a certain food.  Use a measuring cup to measure serving sizes. You could also try weighing out portions on a kitchen scale. With time, you will be able to estimate serving sizes for some foods.  Take time to put servings of different foods on your favorite plates or in your favorite bowls and cups so you know what a serving looks like.  Try not to eat straight from a food's packaging, such as from a bag or box. Eating straight from the package makes it hard to see how much you are eating and can lead to overeating. Put the amount you would like to eat in a cup or on a plate to make sure you are eating the right portion.  Use smaller plates, glasses, and bowls for smaller  portions and to prevent overeating.  Try not to multitask. For example, avoid watching TV or using your computer while eating. If it is time to eat, sit down at a table and enjoy your food. This will help you recognize when you are full. It will also help you be more mindful of what and how much you are eating. What are tips for following this plan? Reading food labels  Check the calorie count compared with the serving size. The serving size may be smaller than what you are used to eating.  Check the source of the calories. Try to choose foods that are high in protein, fiber, and vitamins, and low in saturated fat, trans fat, and sodium. Shopping  Read nutrition labels while you shop. This will help you make healthy decisions about which foods to buy.  Pay attention to nutrition labels for low-fat or fat-free foods. These foods sometimes have the same number of calories or more calories than the full-fat versions. They also often have added sugar, starch, or salt to make up for flavor that was removed with the fat.  Make a grocery list of lower-calorie foods and stick to it. Cooking  Try to cook your favorite foods in a healthier way. For example, try baking instead of frying.  Use low-fat dairy products. Meal planning  Use more fruits and vegetables. One-half of your plate should be fruits and vegetables.  Include lean proteins, such as chicken, turkey, and fish. Lifestyle Each week, aim to do one of the following:  150 minutes of moderate exercise, such as walking.  75 minutes of vigorous exercise, such as running. General information  Know how many calories are in the foods you eat most often. This will help you calculate calorie counts faster.  Find a way of tracking calories that works for you. Get creative. Try different apps or programs if writing down calories does not work for you. What foods should I eat?  Eat nutritious foods. It is better to have a nutritious,  high-calorie food, such as an avocado, than a food with few nutrients, such as a bag of potato chips.  Use your calories on foods and drinks that will fill you up and will not leave you hungry soon after eating. ? Examples of foods that fill you up are nuts and nut butters, vegetables, lean proteins, and high-fiber foods such as whole grains. High-fiber foods are foods with more than 5 g of fiber per serving.  Pay attention to calories in drinks. Low-calorie drinks include water and unsweetened drinks. The items listed above may not be a complete list of foods and beverages you can eat.   Contact a dietitian for more information.   What foods should I limit? Limit foods or drinks that are not good sources of vitamins, minerals, or protein or that are high in unhealthy fats. These include:  Candy.  Other sweets.  Sodas, specialty coffee drinks, alcohol, and juice. The items listed above may not be a complete list of foods and beverages you should avoid. Contact a dietitian for more information. How do I count calories when eating out?  Pay attention to portions. Often, portions are much larger when eating out. Try these tips to keep portions smaller: ? Consider sharing a meal instead of getting your own. ? If you get your own meal, eat only half of it. Before you start eating, ask for a container and put half of your meal into it. ? When available, consider ordering smaller portions from the menu instead of full portions.  Pay attention to your food and drink choices. Knowing the way food is cooked and what is included with the meal can help you eat fewer calories. ? If calories are listed on the menu, choose the lower-calorie options. ? Choose dishes that include vegetables, fruits, whole grains, low-fat dairy products, and lean proteins. ? Choose items that are boiled, broiled, grilled, or steamed. Avoid items that are buttered, battered, fried, or served with cream sauce. Items labeled as  crispy are usually fried, unless stated otherwise. ? Choose water, low-fat milk, unsweetened iced tea, or other drinks without added sugar. If you want an alcoholic beverage, choose a lower-calorie option, such as a glass of wine or light beer. ? Ask for dressings, sauces, and syrups on the side. These are usually high in calories, so you should limit the amount you eat. ? If you want a salad, choose a garden salad and ask for grilled meats. Avoid extra toppings such as bacon, cheese, or fried items. Ask for the dressing on the side, or ask for olive oil and vinegar or lemon to use as dressing.  Estimate how many servings of a food you are given. Knowing serving sizes will help you be aware of how much food you are eating at restaurants. Where to find more information  Centers for Disease Control and Prevention: http://www.wolf.info/  [place] Department of Agriculture: http://www.wilson-mendoza.org/ Summary  Calorie counting means keeping track of how many calories you eat and drink each day. If you eat fewer calories than your body needs, you should lose weight.  A healthy amount of weight to lose per week is usually 1-2 lb (0.5-0.9 kg). This usually means reducing your daily calorie intake by 500-750 calories.  The number of calories in a food can be found on a Nutrition Facts label. If a food does not have a Nutrition Facts label, try to look up the calories online or ask your dietitian for help.  Use smaller plates, glasses, and bowls for smaller portions and to prevent overeating.  Use your calories on foods and drinks that will fill you up and not leave you hungry shortly after a meal. This information is not intended to replace advice given to you by your health care provider. Make sure you discuss any questions you have with your health care provider. Document Revised: 07/02/2019 Document Reviewed: 07/02/2019 Elsevier Patient Education  [place]. Phentermine tablets or capsules What is this  medicine? PHENTERMINE (FEN ter meen) decreases your appetite. It is used with a reduced calorie diet and exercise to help you lose weight. This medicine may be used for  other purposes; ask your health care provider or pharmacist if you have questions. COMMON BRAND NAME(S): Adipex-P, Atti-Plex P, Atti-Plex P Spansule, Fastin, Lomaira, Pro-Fast, Tara-8 What should I tell my health care provider before I take this medicine? They need to know if you have any of these conditions:  agitation or nervousness  diabetes  glaucoma  heart disease  high blood pressure  history of drug abuse or addiction  history of stroke  kidney disease  lung disease called Primary Pulmonary Hypertension (PPH)  taken an MAOI like Carbex, Eldepryl, Marplan, Nardil, or Parnate in last 14 days  taking stimulant medicines for attention disorders, weight loss, or to stay awake  thyroid disease  an unusual or allergic reaction to phentermine, other medicines, foods, dyes, or preservatives  pregnant or trying to get pregnant  breast-feeding How should I use this medicine? Take this medicine by mouth with a glass of water. Follow the directions on the prescription label. Take your medicine at regular intervals. Do not take it more often than directed. Do not stop taking except on your doctor's advice. Talk to your pediatrician regarding the use of this medicine in children. While this drug may be prescribed for children 17 years or older for selected conditions, precautions do apply. Overdosage: If you think you have taken too much of this medicine contact a poison control center or emergency room at once. NOTE: This medicine is only for you. Do not share this medicine with others. What if I miss a dose? If you miss a dose, take it as soon as you can. If it is almost time for your next dose, take only that dose. Do not take double or extra doses. What may interact with this medicine? Do not take this medicine  with any of the following medications:  MAOIs like Carbex, Eldepryl, Marplan, Nardil, and Parnate This medicine may also interact with the following medications:  alcohol  certain medicines for depression, anxiety, or psychotic disorders  certain medicines for high blood pressure  linezolid  medicines for colds or breathing difficulties like pseudoephedrine or phenylephrine  medicines for diabetes  sibutramine  stimulant medicines for attention disorders, weight loss, or to stay awake This list may not describe all possible interactions. Give your health care provider a list of all the medicines, herbs, non-prescription drugs, or dietary supplements you use. Also tell them if you smoke, drink alcohol, or use illegal drugs. Some items may interact with your medicine. What should I watch for while using this medicine? Visit your doctor or health care provider for regular checks on your progress. Do not stop taking except on your health care provider's advice. You may develop a severe reaction. Your health care provider will tell you how much medicine to take. Do not take this medicine close to bedtime. It may prevent you from sleeping. You may get drowsy or dizzy. Do not drive, use machinery, or do anything that needs mental alertness until you know how this medicine affects you. Do not stand or sit up quickly, especially if you are an older patient. This reduces the risk of dizzy or fainting spells. Alcohol may increase dizziness and drowsiness. Avoid alcoholic drinks. This medicine may affect blood sugar levels. Ask your healthcare provider if changes in diet or medicines are needed if you have diabetes. Women should inform their health care provider if they wish to become pregnant or think they might be pregnant. Losing weight while pregnant is not advised and may cause harm to the  unborn child. Talk to your health care provider for more information. What side effects may I notice from  receiving this medicine? Side effects that you should report to your doctor or health care professional as soon as possible:  allergic reactions like skin rash, itching or hives, swelling of the face, lips, or tongue  breathing problems  changes in emotions or moods  changes in vision  chest pain or chest tightness  fast, irregular heartbeat  feeling faint or lightheaded  increased blood pressure  irritable  restlessness  tremors  seizures  signs and symptoms of a stroke like changes in vision; confusion; trouble speaking or understanding; severe headaches; sudden numbness or weakness of the face, arm or leg; trouble walking; dizziness; loss of balance or coordination  unusually weak or tired Side effects that usually do not require medical attention (report to your doctor or health care professional if they continue or are bothersome):  changes in taste  constipation or diarrhea  dizziness  dry mouth  headache  trouble sleeping  upset stomach This list may not describe all possible side effects. Call your doctor for medical advice about side effects. You may report side effects to FDA at 1-800-FDA-1088. Where should I keep my medicine? Keep out of the reach of children. This medicine can be abused. Keep your medicine in a safe place to protect it from theft. Do not share this medicine with anyone. Selling or giving away this medicine is dangerous and against the law. This medicine may cause harm and death if it is taken by other adults, children, or pets. Return medicine that has not been used to an official disposal site. Contact the DEA at (873)192-8232 or your city/county government to find a site. If you cannot return the medicine, mix any unused medicine with a substance like cat litter or coffee grounds. Then throw the medicine away in a sealed container like a sealed bag or coffee can with a lid. Do not use the medicine after the expiration date. Store at  room temperature between 20 and 25 degrees C (68 and 77 degrees F). Keep container tightly closed. NOTE: This sheet is a summary. It may not cover all possible information. If you have questions about this medicine, talk to your doctor, pharmacist, or health care provider.  2021 Elsevier/Gold Standard (2019-03-27 12:54:20)

## 2020-10-26 NOTE — Progress Notes (Signed)
Established Patient Office Visit  Subjective:  Patient ID: Laura Wiggins, female    DOB: Sep 04, 1985  Age: 35 y.o. MRN: [id number]  CC:  Chief Complaint  Patient presents with  . Follow-up    HPI Laura Wiggins presents for follow up with her anxiety and depression.  She has been doing well on her current medications and denies any problems or changes.  She also would like to try something for weight loss she has tried phentermine in the past with good response.  Patient  denies any fever, body aches,chills, rash, chest pain, shortness of breath, nausea, vomiting, or diarrhea.  Denies dizziness, lightheadedness, pre syncopal or syncopal episodes.    Past Medical History:  Diagnosis Date  . Cancer ([place])    Melanoma  . Chicken pox   . COVID-19 07/29/2019   diagnosed 07/29/2019 according to the patient  . Depression   . Endometriosis 2004  . Frequent headaches   . Hx of migraines   . Interstitial cystitis 2004  . Kidney stones   . Urinary incontinence     Past Surgical History:  Procedure Laterality Date  . CESAREAN SECTION N/A 11/14/2017   Procedure: CESAREAN SECTION;  Surgeon: Homero Fellers, MD;  Location: [facility] ORS;  Service: Obstetrics;  Laterality: N/A;  . TONSILLECTOMY AND ADENOIDECTOMY  2013    Family History  Problem Relation Age of Onset  . Hyperlipidemia Mother   . Hypertension Mother   . Diabetes Mother     Social History   Socioeconomic History  . Marital status: Single    Spouse name: Not on file  . Number of children: Not on file  . Years of education: Not on file  . Highest education level: Not on file  Occupational History  . Not on file  Tobacco Use  . Smoking status: Former Research scientist (life sciences)  . Smokeless tobacco: Never Used  Vaping Use  . Vaping Use: Never used  Substance and Sexual Activity  . Alcohol use: Not Currently    Alcohol/week: 0.0 standard drinks    Comment: occasioanlly  . Drug use: No  . Sexual activity: Yes    Birth  control/protection: None  Other Topics Concern  . Not on file  Social History Narrative  . Not on file   Social Determinants of Health   Financial Resource Strain: Not on file  Food Insecurity: Not on file  Transportation Needs: Not on file  Physical Activity: Not on file  Stress: Not on file  Social Connections: Not on file  Intimate Partner Violence: Not on file    Outpatient Medications Prior to Visit  Medication Sig Dispense Refill  . Multiple Vitamin (MULTIVITAMIN) capsule Take 1 capsule by mouth daily.    . Potassium Citrate 15 MEQ (1620 MG) TBCR Take 1 tablet by mouth in the morning and at bedtime. 60 tablet 11  . buPROPion (WELLBUTRIN XL) 300 MG 24 hr tablet Take 1 tablet (300 mg total) by mouth daily. 30 tablet 1  . SUMAtriptan (IMITREX) 25 MG tablet Take 1 tablet (25 mg total) by mouth once for 1 dose. May repeat in 2 hours if headache persists or recurs. 10 tablet 0   No facility-administered medications prior to visit.    No Known Allergies  ROS Review of Systems  Constitutional: Negative.   Eyes: Negative.   Respiratory: Negative.   Cardiovascular: Negative.   Genitourinary: Negative.   Neurological: Negative.   Hematological: Negative.       Objective:  Physical Exam Vitals reviewed.  Constitutional:      General: She is not in acute distress.    Appearance: She is well-developed. She is obese. She is not ill-appearing, toxic-appearing or diaphoretic.     Interventions: She is not intubated. HENT:     Head: Normocephalic and atraumatic.     Right Ear: External ear normal.     Left Ear: External ear normal.     Nose: Nose normal.     Mouth/Throat:     Pharynx: No oropharyngeal exudate or posterior oropharyngeal erythema.  Eyes:     General: Lids are normal. No scleral icterus.       Right eye: No discharge.        Left eye: No discharge.     Conjunctiva/sclera: Conjunctivae normal.     Right eye: Right conjunctiva is not injected. No exudate  or hemorrhage.    Left eye: Left conjunctiva is not injected. No exudate or hemorrhage.    Pupils: Pupils are equal, round, and reactive to light.  Neck:     Thyroid: No thyroid mass or thyromegaly.     Vascular: Normal carotid pulses. No carotid bruit, hepatojugular reflux or JVD.     Trachea: Trachea and phonation normal. No tracheal tenderness or tracheal deviation.     Meningeal: Brudzinski's sign and Kernig's sign absent.  Cardiovascular:     Rate and Rhythm: Normal rate and regular rhythm.     Pulses: Normal pulses.          Radial pulses are 2+ on the right side and 2+ on the left side.       Dorsalis pedis pulses are 2+ on the right side and 2+ on the left side.       Posterior tibial pulses are 2+ on the right side and 2+ on the left side.     Heart sounds: Normal heart sounds, S1 normal and S2 normal. Heart sounds not distant. No murmur heard. No friction rub. No gallop.   Pulmonary:     Effort: Pulmonary effort is normal. No tachypnea, bradypnea, accessory muscle usage or respiratory distress. She is not intubated.     Breath sounds: Normal breath sounds. No stridor. No wheezing, rhonchi or rales.  Chest:     Chest wall: No tenderness.  Breasts:     Right: No supraclavicular adenopathy.     Left: No supraclavicular adenopathy.    Abdominal:     General: Bowel sounds are normal. There is no distension or abdominal bruit.     Palpations: Abdomen is soft. There is no shifting dullness, fluid wave, hepatomegaly, splenomegaly, mass or pulsatile mass.     Tenderness: There is no abdominal tenderness. There is no right CVA tenderness, left CVA tenderness, guarding or rebound.     Hernia: No hernia is present.  Musculoskeletal:        General: No tenderness or deformity. Normal range of motion.     Cervical back: Full passive range of motion without pain, normal range of motion and neck supple. No edema, erythema or rigidity. No spinous process tenderness or muscular tenderness.  Normal range of motion.  Lymphadenopathy:     Head:     Right side of head: No submental, submandibular, tonsillar, preauricular, posterior auricular or occipital adenopathy.     Left side of head: No submental, submandibular, tonsillar, preauricular, posterior auricular or occipital adenopathy.     Cervical: No cervical adenopathy.     Right cervical: No superficial, deep or  posterior cervical adenopathy.    Left cervical: No superficial, deep or posterior cervical adenopathy.     Upper Body:     Right upper body: No supraclavicular or pectoral adenopathy.     Left upper body: No supraclavicular or pectoral adenopathy.  Skin:    General: Skin is warm and dry.     Coloration: Skin is not pale.     Findings: No abrasion, bruising, burn, ecchymosis, erythema, lesion, petechiae or rash.     Nails: There is no clubbing.  Neurological:     General: No focal deficit present.     Mental Status: She is alert and oriented to person, place, and time.     GCS: GCS eye subscore is 4. GCS verbal subscore is 5. GCS motor subscore is 6.     Cranial Nerves: No cranial nerve deficit.     Sensory: No sensory deficit.     Motor: No weakness, tremor, atrophy, abnormal muscle tone or seizure activity.     Coordination: Coordination normal.     Gait: Gait normal.     Deep Tendon Reflexes: Reflexes are normal and symmetric. Reflexes normal.     Reflex Scores:      Tricep reflexes are 2+ on the right side and 2+ on the left side.      Bicep reflexes are 2+ on the right side and 2+ on the left side.      Brachioradialis reflexes are 2+ on the right side and 2+ on the left side.      Patellar reflexes are 2+ on the right side and 2+ on the left side.      Achilles reflexes are 2+ on the right side and 2+ on the left side. Psychiatric:        Mood and Affect: Mood normal.        Speech: Speech normal.        Behavior: Behavior normal.        Thought Content: Thought content normal.        Judgment:  Judgment normal.     BP 112/72 (BP Location: Left Arm, Patient Position: Sitting, Cuff Size: Normal)   Pulse 87   Temp 98 F (36.7 C)   Ht 5\' 5"  (1.651 m)   Wt 225 lb 3.2 oz (102.2 kg)   LMP 10/18/2020   SpO2 98%   BMI 37.48 kg/m  Wt Readings from Last 3 Encounters:  10/26/20 225 lb 3.2 oz (102.2 kg)  09/27/20 228 lb 12.8 oz (103.8 kg)  09/13/20 228 lb (103.4 kg)     Health Maintenance Due  Topic Date Due  . Hepatitis C Screening  Never done  . COVID-19 Vaccine (3 - Pfizer risk 4-dose series) 02/22/2020    There are no preventive care reminders to display for this patient.  Lab Results  Component Value Date   TSH 2.10 10/07/2020   Lab Results  Component Value Date   WBC 5.0 10/26/2020   HGB 12.3 10/26/2020   HCT 35.6 (L) 10/26/2020   MCV 86.5 10/26/2020   PLT 245.0 10/26/2020   Lab Results  Component Value Date   NA 140 10/07/2020   K 3.7 10/07/2020   CO2 25 10/07/2020   GLUCOSE 97 10/07/2020   BUN 12 10/07/2020   CREATININE 0.84 10/07/2020   BILITOT 0.5 10/07/2020   ALKPHOS 29 (L) 10/07/2020   AST 11 10/07/2020   ALT 14 10/07/2020   PROT 6.1 10/07/2020   ALBUMIN 4.2 10/07/2020  CALCIUM 9.0 10/07/2020   ANIONGAP 10 08/28/2020   GFR 89.17 10/07/2020   Lab Results  Component Value Date   CHOL 218 (H) 10/07/2020   Lab Results  Component Value Date   HDL 42.20 10/07/2020   Lab Results  Component Value Date   LDLCALC 142 (H) 10/07/2020   Lab Results  Component Value Date   TRIG 168.0 (H) 10/07/2020   Lab Results  Component Value Date   CHOLHDL 5 10/07/2020   No results found for: HGBA1C    Assessment & Plan:   Problem List Items Addressed This Visit   None   Visit Diagnoses    Vitamin D deficiency    -  Primary   Relevant Orders   VITAMIN D 25 Hydroxy (Vit-D Deficiency, Fractures) (Completed)   History of migraine       Iron deficiency anemia, unspecified iron deficiency anemia type       Relevant Orders   CBC with  Differential/Platelet (Completed)   Iron, TIBC and Ferritin Panel (Completed)   Other migraine without status migrainosus, not intractable       Relevant Medications   buPROPion (WELLBUTRIN XL) 300 MG 24 hr tablet   Weight loss       Depression, recurrent ([facility])       Relevant Medications   buPROPion (WELLBUTRIN XL) 300 MG 24 hr tablet      Meds ordered this encounter  Medications  . buPROPion (WELLBUTRIN XL) 300 MG 24 hr tablet    Sig: Take 1 tablet (300 mg total) by mouth daily.    Dispense:  90 tablet    Refill:  1  . phentermine 30 MG capsule    Sig: Take 1 capsule (30 mg total) by mouth every morning.    Dispense:  30 capsule    Refill:  0  . SUMAtriptan (IMITREX) 25 MG tablet    Sig: Take 1 tablet (25 mg total) by mouth once for 1 dose. May repeat in 2 hours if headache persists or recurs.    Dispense:  10 tablet    Refill:  0  side effects discussed. Return in about 1 month (around 11/26/2020), or if symptoms worsen or fail to improve, for at any time for any worsening symptoms.  Follow-up: Return in about 1 month (around 11/26/2020), or if symptoms worsen or fail to improve, for at any time for any worsening symptoms.    Marcille Buffy, FNP

## 2020-10-27 LAB — CBC WITH DIFFERENTIAL/PLATELET
Basophils Absolute: 0 10*3/uL (ref 0.0–0.1)
Basophils Relative: 0.6 % (ref 0.0–3.0)
Eosinophils Absolute: 0.1 10*3/uL (ref 0.0–0.7)
Eosinophils Relative: 1.9 % (ref 0.0–5.0)
HCT: 35.6 % — ABNORMAL LOW (ref 36.0–46.0)
Hemoglobin: 12.3 g/dL (ref 12.0–15.0)
Lymphocytes Relative: 17.9 % (ref 12.0–46.0)
Lymphs Abs: 0.9 10*3/uL (ref 0.7–4.0)
MCHC: 34.5 g/dL (ref 30.0–36.0)
MCV: 86.5 fl (ref 78.0–100.0)
Monocytes Absolute: 0.5 10*3/uL (ref 0.1–1.0)
Monocytes Relative: 9.4 % (ref 3.0–12.0)
Neutro Abs: 3.5 10*3/uL (ref 1.4–7.7)
Neutrophils Relative %: 70.2 % (ref 43.0–77.0)
Platelets: 245 10*3/uL (ref 150.0–400.0)
RBC: 4.12 Mil/uL (ref 3.87–5.11)
RDW: 13.4 % (ref 11.5–15.5)
WBC: 5 10*3/uL (ref 4.0–10.5)

## 2020-10-27 LAB — VITAMIN D 25 HYDROXY (VIT D DEFICIENCY, FRACTURES): VITD: 22.04 ng/mL — ABNORMAL LOW (ref 30.00–100.00)

## 2020-10-27 NOTE — Progress Notes (Signed)
Vitamin  D is low, this can contribute to poor sleep and fatigue, will send in prescription for Vitamin D at 50,000 units by mouth once every 7 days/(once weekly) for 12 weeks. Advise recheck lab Vitamin D in 1-2 weeks after completing vitamin d prescription. Labs need to be scheduled.   CBC ok hematocrit slightly low, Iron saturation slightly low, she is on a multivitamin, will have her add iron tablet every other day as discussed at office.   CBC, Recheck Iron, TIBC ferritin - in 3 months.

## 2020-10-28 ENCOUNTER — Telehealth: Payer: Self-pay

## 2020-10-28 NOTE — Telephone Encounter (Signed)
Pt returned your call about labs 

## 2020-10-31 ENCOUNTER — Encounter: Payer: Self-pay | Admitting: Adult Health

## 2020-11-01 ENCOUNTER — Other Ambulatory Visit: Payer: Self-pay | Admitting: *Deleted

## 2020-11-01 ENCOUNTER — Encounter: Payer: Self-pay | Admitting: *Deleted

## 2020-11-01 DIAGNOSIS — D509 Iron deficiency anemia, unspecified: Secondary | ICD-10-CM

## 2020-11-23 ENCOUNTER — Other Ambulatory Visit: Payer: Self-pay | Admitting: Adult Health

## 2020-11-23 DIAGNOSIS — R634 Abnormal weight loss: Secondary | ICD-10-CM

## 2020-11-23 DIAGNOSIS — Z8669 Personal history of other diseases of the nervous system and sense organs: Secondary | ICD-10-CM

## 2020-11-28 ENCOUNTER — Ambulatory Visit: Payer: 59 | Admitting: Adult Health

## 2020-12-08 ENCOUNTER — Ambulatory Visit: Payer: Self-pay | Admitting: Obstetrics & Gynecology

## 2020-12-26 ENCOUNTER — Ambulatory Visit (INDEPENDENT_AMBULATORY_CARE_PROVIDER_SITE_OTHER): Payer: Self-pay | Admitting: Obstetrics & Gynecology

## 2020-12-26 ENCOUNTER — Other Ambulatory Visit: Payer: Self-pay

## 2020-12-26 ENCOUNTER — Other Ambulatory Visit (HOSPITAL_COMMUNITY)
Admission: RE | Admit: 2020-12-26 | Discharge: 2020-12-26 | Disposition: A | Discharge status: Home / Self Care | Payer: 59 | Source: Ambulatory Visit | Attending: Obstetrics & Gynecology | Admitting: Obstetrics & Gynecology

## 2020-12-26 ENCOUNTER — Encounter: Payer: Self-pay | Admitting: Obstetrics & Gynecology

## 2020-12-26 VITALS — BP 120/80 | Ht 65.0 in | Wt 218.0 lb

## 2020-12-26 DIAGNOSIS — N870 Mild cervical dysplasia: Secondary | ICD-10-CM | POA: Diagnosis not present

## 2020-12-26 NOTE — Progress Notes (Signed)
HPI:  Patient is a 35 y.o. [id number] presenting for follow up evaluation of abnormal PAP smear in the past.  Her last PAP was 6 months ago and approximate date ASCUS and was abnormal: Prior CIN I . She has had a prior colposcopy. Prior biopsies (if done) were  CIN I 2019 .  PMHx: She  has a past medical history of Cancer ([place]), Chicken pox, COVID-19 (07/29/2019), Depression, Endometriosis (2004), Frequent headaches, migraines, Interstitial cystitis (2004), Kidney stones, and Urinary incontinence. Also,  has a past surgical history that includes Tonsillectomy and adenoidectomy (2013) and Cesarean section (N/A, 11/14/2017)., family history includes Diabetes in her mother; Hyperlipidemia in her mother; Hypertension in her mother.,  reports that she has quit smoking. She has never used smokeless tobacco. She reports previous alcohol use. She reports that she does not use drugs.  She has a current medication list which includes the following prescription(s): bupropion, multivitamin, phentermine, potassium citrate, and sumatriptan. Also, has No Known Allergies.  Review of Systems  Constitutional:  Negative for chills, fever and malaise/fatigue.  HENT:  Negative for congestion, sinus pain and sore throat.   Eyes:  Negative for blurred vision and pain.  Respiratory:  Negative for cough and wheezing.   Cardiovascular:  Negative for chest pain and leg swelling.  Gastrointestinal:  Negative for abdominal pain, constipation, diarrhea, heartburn, nausea and vomiting.  Genitourinary:  Negative for dysuria, frequency, hematuria and urgency.  Musculoskeletal:  Negative for back pain, joint pain, myalgias and neck pain.  Skin:  Negative for itching and rash.  Neurological:  Negative for dizziness, tremors and weakness.  Endo/Heme/Allergies:  Does not bruise/bleed easily.  Psychiatric/Behavioral:  Negative for depression. The patient is not nervous/anxious and does not have insomnia.    Objective: BP 120/80   Ht  '5\' 5"'$  (1.651 m)   Wt 218 lb (98.9 kg)   LMP 12/06/2020   BMI 36.28 kg/m  Filed Weights   12/26/20 0926  Weight: 218 lb (98.9 kg)   Body mass index is 36.28 kg/m.  Physical examination Physical Exam Constitutional:      General: She is not in acute distress.    Appearance: She is well-developed.  Genitourinary:     Right Labia: No rash or tenderness.    Left Labia: No tenderness or rash.    No vaginal erythema or bleeding.      Right Adnexa: not tender and no mass present.    Left Adnexa: not tender and no mass present.    No cervical motion tenderness, discharge, polyp or nabothian cyst.     Uterus is not enlarged.     No uterine mass detected.    Pelvic exam was performed with patient in the lithotomy position.  HENT:     Head: Normocephalic and atraumatic.     Nose: Nose normal.  Abdominal:     General: There is no distension.     Palpations: Abdomen is soft.     Tenderness: There is no abdominal tenderness.  Musculoskeletal:        General: Normal range of motion.  Neurological:     Mental Status: She is alert and oriented to person, place, and time.     Cranial Nerves: No cranial nerve deficit.  Skin:    General: Skin is warm and dry.  Psychiatric:        Attention and Perception: Attention normal.        Mood and Affect: Mood and affect normal.  Speech: Speech normal.        Behavior: Behavior normal.        Thought Content: Thought content normal.        Judgment: Judgment normal.    ASSESSMENT:  History of Cervical Dysplasia  Plan:  1.  I discussed the grading system of pap smears and HPV high risk viral types.   2. Follow up PAP 6 months, vs intervention if high grade dysplasia identified.  A total of 20 minutes were spent face-to-face with the patient as well as preparation, review, communication, and documentation during this encounter.    Barnett Applebaum, MD, [name], [facility] 12/26/2020  9:51 AM

## 2020-12-29 LAB — CYTOLOGY - PAP: Diagnosis: NEGATIVE

## 2021-04-25 ENCOUNTER — Ambulatory Visit: Payer: 59 | Admitting: Urology

## 2021-04-26 ENCOUNTER — Other Ambulatory Visit: Payer: Self-pay | Admitting: Adult Health

## 2021-04-26 DIAGNOSIS — F339 Major depressive disorder, recurrent, unspecified: Secondary | ICD-10-CM

## 2021-05-25 ENCOUNTER — Ambulatory Visit (INDEPENDENT_AMBULATORY_CARE_PROVIDER_SITE_OTHER): Payer: Self-pay | Admitting: Adult Health

## 2021-05-25 ENCOUNTER — Encounter: Payer: Self-pay | Admitting: Adult Health

## 2021-05-25 ENCOUNTER — Other Ambulatory Visit: Payer: Self-pay

## 2021-05-25 DIAGNOSIS — F339 Major depressive disorder, recurrent, unspecified: Secondary | ICD-10-CM

## 2021-05-25 MED ORDER — BUPROPION HCL ER (XL) 300 MG PO TB24: 300.0000 mg | ORAL_TABLET | Freq: Every day | ORAL | 1 refills | Status: DC

## 2021-05-25 NOTE — Patient Instructions (Signed)
Managing Depression, Adult Depression is a mental health condition that affects your thoughts, feelings, and actions. Being diagnosed with depression can bring you relief if you did not know why you have felt or behaved a certain way. It could also leave you feeling overwhelmed with uncertainty about your future. Preparing yourself to manage your symptoms can help you feel more positive about your future. How to manage lifestyle changes Managing stress Stress is your body's reaction to life changes and events, both good and bad. Stress can add to your feelings of depression. Learning to manage your stress can help lessen your feelings of depression. Try some of the following approaches to reducing your stress (stress reduction techniques): Listen to music that you enjoy and that inspires you. Try using a meditation app or take a meditation class. Develop a practice that helps you connect with your spiritual self. Walk in nature, pray, or go to a place of worship. Do some deep breathing. To do this, inhale slowly through your nose. Pause at the top of your inhale for a few seconds and then exhale slowly, letting your muscles relax. Practice yoga to help relax and work your muscles. Choose a stress reduction technique that suits your lifestyle and personality. These techniques take time and practice to develop. Set aside 5-15 minutes a day to do them. Therapists can offer training in these techniques. Other things you can do to manage stress include: Keeping a stress diary. Knowing your limits and saying no when you think something is too much. Paying attention to how you react to certain situations. You may not be able to control everything, but you can change your reaction. Adding humor to your life by watching funny films or TV shows. Making time for activities that you enjoy and that relax you.  Medicines Medicines, such as antidepressants, are often a part of treatment for depression. Talk  with your pharmacist or health care provider about all the medicines, supplements, and herbal products that you take, their possible side effects, and what medicines and other products are safe to take together. Make sure to report any side effects you may have to your health care provider. Relationships Your health care provider may suggest family therapy, couples therapy, or individual therapy as part of your treatment. How to recognize changes Everyone responds differently to treatment for depression. As you recover from depression, you may start to: Have more interest in doing activities. Feel less hopeless. Have more energy. Overeat less often, or have a better appetite. Have better mental focus. It is important to recognize if your depression is not getting better or is getting worse. The symptoms you had in the beginning may return, such as: Tiredness (fatigue) or low energy. Eating too much or too little. Sleeping too much or too little. Feeling restless, agitated, or hopeless. Trouble focusing or making decisions. Unexplained physical complaints. Feeling irritable, angry, or aggressive. If you or your family members notice these symptoms coming back, let your health care provider know right away. Follow these instructions at home: Activity  Try to get some form of exercise each day, such as walking, biking, swimming, or lifting weights. Practice stress reduction techniques. Engage your mind by taking a class or doing some volunteer work. Lifestyle Get the right amount and quality of sleep. Cut down on using caffeine, tobacco, alcohol, and other potentially harmful substances. Eat a healthy diet that includes plenty of vegetables, fruits, whole grains, low-fat dairy products, and lean protein. Do not eat a lot  of foods that are high in solid fats, added sugars, or salt (sodium). General instructions Take over-the-counter and prescription medicines only as told by your health  care provider. Keep all follow-up visits as told by your health care provider. This is important. Where to find support Talking to others Friends and family members can be sources of support and guidance. Talk to trusted friends or family members about your condition. Explain your symptoms to them, and let them know that you are working with a health care provider to treat your depression. Tell friends and family members how they also can be helpful. Finances Find appropriate mental health providers that fit with your financial situation. Talk with your health care provider about options to get reduced prices on your medicines. Where to find more information You can find support in your area from: Anxiety and Depression Association of America (ADAA): www.adaa.org Mental Health America: www.mentalhealthamerica.net [employer] on Mental Illness: www.nami.org Contact a health care provider if: You stop taking your antidepressant medicines, and you have any of these symptoms: Nausea. Headache. Light-headedness. Chills and body aches. Not being able to sleep (insomnia). You or your friends and family think your depression is getting worse. Get help right away if: You have thoughts of hurting yourself or others. If you ever feel like you may hurt yourself or others, or have thoughts about taking your own life, get help right away. Go to your nearest emergency department or: Call your local emergency services (911 in the [place] Call a suicide crisis helpline, such as the [facility] at 623-357-1790 or 988 in the [place]. This is open 24 hours a day in the [place] Text the Crisis Text Line at 262-276-0379 (in the [place].). Summary If you are diagnosed with depression, preparing yourself to manage your symptoms is a good way to feel positive about your future. Work with your health care provider on a management plan that includes stress reduction techniques, medicines (if  applicable), therapy, and healthy lifestyle habits. Keep talking with your health care provider about how your treatment is working. If you have thoughts about taking your own life, call a suicide crisis helpline or text a crisis text line. This information is not intended to replace advice given to you by your health care provider. Make sure you discuss any questions you have with your health care provider. Document Revised: 12/14/2020 Document Reviewed: 04/01/2019 Elsevier Patient Education  [place]. Bupropion Extended-Release Tablets (Depression/Mood Disorders) What is this medication? BUPROPION (byoo PROE pee on) treats depression. It increases norepinephrine and dopamine in the brain, hormones that help regulate mood. It belongs to a group of medications called NDRIs. This medicine may be used for other purposes; ask your health care provider or pharmacist if you have questions. COMMON BRAND NAME(S): Aplenzin, Budeprion XL, Forfivo XL, Wellbutrin XL What should I tell my care team before I take this medication? They need to know if you have any of these conditions: An eating disorder, such as anorexia or bulimia Bipolar disorder or psychosis Diabetes or high blood sugar, treated with medication Glaucoma Head injury or brain tumor Heart disease, previous heart attack, or irregular heart beat High blood pressure Kidney or liver disease Seizures (convulsions) Suicidal thoughts or a previous suicide attempt Tourette's syndrome Weight loss An unusual or allergic reaction to bupropion, other medications, foods, dyes, or preservatives Pregnant or trying to become pregnant Breast-feeding How should I use this medication? Take this medication by mouth with a glass of water.  Follow the directions on the prescription label. You can take it with or without food. If it upsets your stomach, take it with food. Do not crush, chew, or cut these tablets. This medication is taken once daily  at the same time each day. Do not take your medication more often than directed. Do not stop taking this medication suddenly except upon the advice of your care team. Stopping this medication too quickly may cause serious side effects or your condition may worsen. A special MedGuide will be given to you by the pharmacist with each prescription and refill. Be sure to read this information carefully each time. Talk to your care team about the use of this medication in children. Special care may be needed. Overdosage: If you think you have taken too much of this medicine contact a poison control center or emergency room at once. NOTE: This medicine is only for you. Do not share this medicine with others. What if I miss a dose? If you miss a dose, skip the missed dose and take your next tablet at the regular time. Do not take double or extra doses. What may interact with this medication? Do not take this medication with any of the following: Linezolid MAOIs like Azilect, Carbex, Eldepryl, Marplan, Nardil, and Parnate Methylene blue (injected into a vein) Other medications that contain bupropion like Zyban This medication may also interact with the following: Alcohol Certain medications for anxiety or sleep Certain medications for blood pressure like metoprolol, propranolol Certain medications for depression or psychotic disturbances Certain medications for HIV or AIDS like efavirenz, lopinavir, nelfinavir, ritonavir Certain medications for irregular heart beat like propafenone, flecainide Certain medications for Parkinson's disease like amantadine, levodopa Certain medications for seizures like carbamazepine, phenytoin, phenobarbital Cimetidine Clopidogrel Cyclophosphamide Digoxin Furazolidone Isoniazid Nicotine Orphenadrine Procarbazine Steroid medications like prednisone or cortisone Stimulant medications for attention disorders, weight loss, or to stay  awake Tamoxifen Theophylline Thiotepa Ticlopidine Tramadol Warfarin This list may not describe all possible interactions. Give your health care provider a list of all the medicines, herbs, non-prescription drugs, or dietary supplements you use. Also tell them if you smoke, drink alcohol, or use illegal drugs. Some items may interact with your medicine. What should I watch for while using this medication? Tell your care team if your symptoms do not get better or if they get worse. Visit your care team for regular checks on your progress. Because it may take several weeks to see the full effects of this medication, it is important to continue your treatment as prescribed. Watch for new or worsening thoughts of suicide or depression. This includes sudden changes in mood, behavior, or thoughts. These changes can happen at any time but are more common in the beginning of treatment or after a change in dose. Call your care team right away if you experience these thoughts or worsening depression. Manic episodes may happen in patients with bipolar disorder who take this medication. Watch for changes in feelings or behaviors such as feeling anxious, nervous, agitated, panicky, irritable, hostile, aggressive, impulsive, severely restless, overly excited and hyperactive, or trouble sleeping. These symptoms can happen at anytime but are more common in the beginning of treatment or after a change in dose. Call your care team right away if you notice any of these symptoms. This medication may cause serious skin reactions. They can happen weeks to months after starting the medication. Contact your care team right away if you notice fevers or flu-like symptoms with a rash. The rash  may be red or purple and then turn into blisters or peeling of the skin. Or, you might notice a red rash with swelling of the face, lips or lymph nodes in your neck or under your arms. Avoid drinks that contain alcohol while taking this  medication. Drinking large amounts of alcohol, using sleeping or anxiety medications, or quickly stopping the use of these agents while taking this medication may increase your risk for a seizure. Do not drive or use heavy machinery until you know how this medication affects you. This medication can impair your ability to perform these tasks. Do not take this medication close to bedtime. It may prevent you from sleeping. Your mouth may get dry. Chewing sugarless gum or sucking hard candy, and drinking plenty of water may help. Contact your care team if the problem does not go away or is severe. The tablet shell for some brands of this medication does not dissolve. This is normal. The tablet shell may appear whole in the stool. This is not a cause for concern. What side effects may I notice from receiving this medication? Side effects that you should report to your care team as soon as possible: Allergic reactions--skin rash, itching, hives, swelling of the face, lips, tongue, or throat Increase in blood pressure Mood and behavior changes--anxiety, nervousness, confusion, hallucinations, irritability, hostility, thoughts of suicide or self-harm, worsening mood, feelings of depression Redness, blistering, peeling, or loosening of the skin, including inside the mouth Seizures Sudden eye pain or change in vision such as blurry vision, seeing halos around lights, vision loss Side effects that usually do not require medical attention (report to your care team if they continue or are bothersome): Constipation Dizziness Dry mouth Loss of appetite Nausea Tremors or shaking Trouble sleeping This list may not describe all possible side effects. Call your doctor for medical advice about side effects. You may report side effects to FDA at 1-800-FDA-1088. Where should I keep my medication? Keep out of the reach of children and pets. Store at room temperature between 15 and 30 degrees C (59 and 86 degrees  F). Throw away any unused medication after the expiration date. NOTE: This sheet is a summary. It may not cover all possible information. If you have questions about this medicine, talk to your doctor, pharmacist, or health care provider.  2022 Elsevier/Gold Standard (2020-08-03 00:00:00)

## 2021-05-25 NOTE — Progress Notes (Signed)
Established Patient Office Visit  Subjective:  Patient ID: Laura Wiggins, female    DOB: November 18, 1985  Age: 35 y.o. MRN: [id number]  CC:  Chief Complaint  Patient presents with   Medication Refill    HPI Laura Wiggins presents for refill on Wellbutrin. She has been taking for years and doing well. No suicidal ideations in intents.  Feels well has no concerns.   Patient  denies any fever, body aches,chills, rash, chest pain, shortness of breath, nausea, vomiting, or diarrhea.  Denies dizziness, lightheadedness, pre syncopal or syncopal episodes.   Going [facility].  She does not have insurance at this time and does not want to have labs done at this time.   Past Medical History:  Diagnosis Date   Cancer [facility])    Melanoma   Chicken pox    COVID-19 07/29/2019   diagnosed 07/29/2019 according to the patient   Depression    Endometriosis 2004   Frequent headaches    Hx of migraines    Interstitial cystitis 2004   Kidney stones    Urinary incontinence     Past Surgical History:  Procedure Laterality Date   CESAREAN SECTION N/A 11/14/2017   Procedure: CESAREAN SECTION;  Surgeon: Homero Fellers, MD;  Location: [facility] ORS;  Service: Obstetrics;  Laterality: N/A;   TONSILLECTOMY AND ADENOIDECTOMY  2013    Family History  Problem Relation Age of Onset   Hyperlipidemia Mother    Hypertension Mother    Diabetes Mother     Social History   Socioeconomic History   Marital status: Single    Spouse name: Not on file   Number of children: Not on file   Years of education: Not on file   Highest education level: Not on file  Occupational History   Not on file  Tobacco Use   Smoking status: Former   Smokeless tobacco: Never  Vaping Use   Vaping Use: Never used  Substance and Sexual Activity   Alcohol use: Not Currently    Alcohol/week: 0.0 standard drinks    Comment: occasioanlly   Drug use: No   Sexual activity: Yes    Birth  control/protection: None  Other Topics Concern   Not on file  Social History Narrative   Not on file   Social Determinants of Health   Financial Resource Strain: Not on file  Food Insecurity: Not on file  Transportation Needs: Not on file  Physical Activity: Not on file  Stress: Not on file  Social Connections: Not on file  Intimate Partner Violence: Not on file    Outpatient Medications Prior to Visit  Medication Sig Dispense Refill   Multiple Vitamin (MULTIVITAMIN) capsule Take 1 capsule by mouth daily.     phentermine 30 MG capsule Take 1 capsule (30 mg total) by mouth every morning. 30 capsule 0   Potassium Citrate 15 MEQ (1620 MG) TBCR Take 1 tablet by mouth in the morning and at bedtime. 60 tablet 11   SUMAtriptan (IMITREX) 25 MG tablet Take 1 tablet (25 mg total) by mouth once for 1 dose. May repeat in 2 hours if headache persists or recurs. 10 tablet 0   buPROPion (WELLBUTRIN XL) 300 MG 24 hr tablet Take 1 tablet (300 mg total) by mouth daily. 90 tablet 1   No facility-administered medications prior to visit.    No Known Allergies  ROS Review of Systems  Constitutional: Negative.   HENT: Negative.  Respiratory: Negative.    Cardiovascular: Negative.   Gastrointestinal: Negative.   Genitourinary: Negative.   Musculoskeletal: Negative.   Neurological: Negative.   Psychiatric/Behavioral: Negative.       Objective:    Physical Exam Vitals reviewed.  Constitutional:      General: She is not in acute distress.    Appearance: She is well-developed. She is not diaphoretic.     Interventions: She is not intubated. HENT:     Head: Normocephalic and atraumatic.     Right Ear: External ear normal.     Left Ear: External ear normal.     Nose: Nose normal.     Mouth/Throat:     Mouth: Mucous membranes are moist.     Pharynx: No oropharyngeal exudate.  Eyes:     General: Lids are normal. No scleral icterus.       Right eye: No discharge.        Left eye: No  discharge.     Conjunctiva/sclera: Conjunctivae normal.     Right eye: Right conjunctiva is not injected. No exudate or hemorrhage.    Left eye: Left conjunctiva is not injected. No exudate or hemorrhage.    Pupils: Pupils are equal, round, and reactive to light.  Neck:     Thyroid: No thyroid mass or thyromegaly.     Vascular: Normal carotid pulses. No carotid bruit, hepatojugular reflux or JVD.     Trachea: Trachea and phonation normal. No tracheal tenderness or tracheal deviation.     Meningeal: Brudzinski's sign and Kernig's sign absent.  Cardiovascular:     Rate and Rhythm: Normal rate and regular rhythm.     Pulses: Normal pulses.          Radial pulses are 2+ on the right side and 2+ on the left side.       Dorsalis pedis pulses are 2+ on the right side and 2+ on the left side.       Posterior tibial pulses are 2+ on the right side and 2+ on the left side.     Heart sounds: Normal heart sounds, S1 normal and S2 normal. Heart sounds not distant. No murmur heard.   No friction rub. No gallop.  Pulmonary:     Effort: Pulmonary effort is normal. No tachypnea, bradypnea, accessory muscle usage or respiratory distress. She is not intubated.     Breath sounds: Normal breath sounds. No stridor. No wheezing or rales.  Chest:     Chest wall: No tenderness.  Abdominal:     General: Bowel sounds are normal. There is no distension or abdominal bruit.     Palpations: Abdomen is soft. There is no shifting dullness, fluid wave, hepatomegaly, splenomegaly, mass or pulsatile mass.     Tenderness: There is no abdominal tenderness. There is no guarding or rebound.     Hernia: No hernia is present.  Musculoskeletal:        General: No tenderness or deformity. Normal range of motion.     Cervical back: Full passive range of motion without pain, normal range of motion and neck supple. No edema, erythema or rigidity. No spinous process tenderness or muscular tenderness. Normal range of motion.   Lymphadenopathy:     Head:     Right side of head: No submental, submandibular, tonsillar, preauricular, posterior auricular or occipital adenopathy.     Left side of head: No submental, submandibular, tonsillar, preauricular, posterior auricular or occipital adenopathy.     Cervical: No cervical adenopathy.  Right cervical: No superficial, deep or posterior cervical adenopathy.    Left cervical: No superficial, deep or posterior cervical adenopathy.     Upper Body:     Right upper body: No supraclavicular or pectoral adenopathy.     Left upper body: No supraclavicular or pectoral adenopathy.  Skin:    General: Skin is warm and dry.     Coloration: Skin is not pale.     Findings: No abrasion, bruising, burn, ecchymosis, erythema, lesion, petechiae or rash.     Nails: There is no clubbing.  Neurological:     Mental Status: She is alert and oriented to person, place, and time.     GCS: GCS eye subscore is 4. GCS verbal subscore is 5. GCS motor subscore is 6.     Cranial Nerves: No cranial nerve deficit.     Sensory: No sensory deficit.     Motor: No tremor, atrophy, abnormal muscle tone or seizure activity.     Coordination: Coordination normal.     Gait: Gait normal.     Deep Tendon Reflexes: Reflexes are normal and symmetric. Reflexes normal. Babinski sign absent on the right side. Babinski sign absent on the left side.     Reflex Scores:      Tricep reflexes are 2+ on the right side and 2+ on the left side.      Bicep reflexes are 2+ on the right side and 2+ on the left side.      Brachioradialis reflexes are 2+ on the right side and 2+ on the left side.      Patellar reflexes are 2+ on the right side and 2+ on the left side.      Achilles reflexes are 2+ on the right side and 2+ on the left side. Psychiatric:        Speech: Speech normal.        Behavior: Behavior normal.        Thought Content: Thought content normal.        Judgment: Judgment normal.    BP 120/78     Pulse 75    Temp (!) 96 F (35.6 C)    Resp 18    Ht 5\' 5"  (1.651 m)    Wt 210 lb 3.2 oz (95.3 kg)    LMP 05/19/2021    SpO2 99%    BMI 34.98 kg/m  Wt Readings from Last 3 Encounters:  05/25/21 210 lb 3.2 oz (95.3 kg)  12/26/20 218 lb (98.9 kg)  10/26/20 225 lb 3.2 oz (102.2 kg)     Health Maintenance Due  Topic Date Due   Pneumococcal Vaccine 26-45 Years old (1 - PCV) Never done   Hepatitis C Screening  Never done   COVID-19 Vaccine (3 - Pfizer risk series) 02/22/2020   INFLUENZA VACCINE  01/02/2021    There are no preventive care reminders to display for this patient.  Lab Results  Component Value Date   TSH 2.10 10/07/2020   Lab Results  Component Value Date   WBC 5.0 10/26/2020   HGB 12.3 10/26/2020   HCT 35.6 (L) 10/26/2020   MCV 86.5 10/26/2020   PLT 245.0 10/26/2020   Lab Results  Component Value Date   NA 140 10/07/2020   K 3.7 10/07/2020   CO2 25 10/07/2020   GLUCOSE 97 10/07/2020   BUN 12 10/07/2020   CREATININE 0.84 10/07/2020   BILITOT 0.5 10/07/2020   ALKPHOS 29 (L) 10/07/2020   AST 11 10/07/2020  ALT 14 10/07/2020   PROT 6.1 10/07/2020   ALBUMIN 4.2 10/07/2020   CALCIUM 9.0 10/07/2020   ANIONGAP 10 08/28/2020   GFR 89.17 10/07/2020   Lab Results  Component Value Date   CHOL 218 (H) 10/07/2020   Lab Results  Component Value Date   HDL 42.20 10/07/2020   Lab Results  Component Value Date   LDLCALC 142 (H) 10/07/2020   Lab Results  Component Value Date   TRIG 168.0 (H) 10/07/2020   Lab Results  Component Value Date   CHOLHDL 5 10/07/2020   No results found for: HGBA1C    Assessment & Plan:   Problem List Items Addressed This Visit   None Visit Diagnoses     Depression, recurrent (HCC)       Relevant Medications   buPROPion (WELLBUTRIN XL) 300 MG 24 hr tablet     Doing well has been on for years. Once insurance is effective she will call and will order labs.  Complete physical when due is advised.    Discussed known  black box warning for anti depression/ anxiety medication. Need to report any behavioral changes right, if any homicidal or suicidal thoughts or ideas seek medical attention right away. Call 911.    Meds ordered this encounter  Medications   buPROPion (WELLBUTRIN XL) 300 MG 24 hr tablet    Sig: Take 1 tablet (300 mg total) by mouth daily.    Dispense:  90 tablet    Refill:  1    Follow-up: Return in about 6 months (around 11/23/2021), or if symptoms worsen or fail to improve, for at any time for any worsening symptoms, Go to Emergency room/ urgent care if worse.    Marcille Buffy, FNP

## 2021-06-28 ENCOUNTER — Ambulatory Visit: Payer: Self-pay | Admitting: Obstetrics & Gynecology

## 2021-06-29 ENCOUNTER — Ambulatory Visit: Payer: Self-pay | Admitting: Obstetrics & Gynecology

## 2021-08-07 ENCOUNTER — Ambulatory Visit (INDEPENDENT_AMBULATORY_CARE_PROVIDER_SITE_OTHER): Payer: Self-pay | Admitting: Obstetrics & Gynecology

## 2021-08-07 ENCOUNTER — Other Ambulatory Visit: Payer: Self-pay

## 2021-08-07 ENCOUNTER — Other Ambulatory Visit (HOSPITAL_COMMUNITY)
Admission: RE | Admit: 2021-08-07 | Discharge: 2021-08-07 | Disposition: A | Discharge status: Home / Self Care | Payer: Self-pay | Source: Ambulatory Visit | Attending: Obstetrics & Gynecology | Admitting: Obstetrics & Gynecology

## 2021-08-07 ENCOUNTER — Encounter: Payer: Self-pay | Admitting: Obstetrics & Gynecology

## 2021-08-07 VITALS — BP 112/60 | Ht 65.0 in | Wt 215.0 lb

## 2021-08-07 DIAGNOSIS — N870 Mild cervical dysplasia: Secondary | ICD-10-CM

## 2021-08-07 DIAGNOSIS — Z01419 Encounter for gynecological examination (general) (routine) without abnormal findings: Secondary | ICD-10-CM

## 2021-08-07 NOTE — Patient Instructions (Signed)
Thank you for choosing [facility]. As part of our ongoing efforts to improve patient experience, we would appreciate your feedback. Please fill out the short survey that you will receive by mail or MyChart. Your opinion is important to us! -Dr Kolbi Tofte  

## 2021-08-07 NOTE — Progress Notes (Signed)
? ?HPI: ?     [name] is a 36 y.o. [id number] who LMP was Patient's last menstrual period was 07/10/2021 (approximate)., she presents today for her annual examination. The patient has no complaints today. The patient is sexually active. Her last pap: was normal.  One year ago it was ASCUS. The patient does perform self breast exams.  There is no notable family history of breast or ovarian cancer in her family.  The patient has regular exercise: yes.  The patient denies current symptoms of depression.   ? ?GYN History: ?Contraception: none ? ?PMHx: ?Past Medical History:  ?Diagnosis Date  ? Cancer [facility])   ? Melanoma  ? Chicken pox   ? COVID-19 07/29/2019  ? diagnosed 07/29/2019 according to the patient  ? Depression   ? Endometriosis 2004  ? Frequent headaches   ? Hx of migraines   ? Interstitial cystitis 2004  ? Kidney stones   ? Urinary incontinence   ? ?Past Surgical History:  ?Procedure Laterality Date  ? CESAREAN SECTION N/A 11/14/2017  ? Procedure: CESAREAN SECTION;  Surgeon: Homero Fellers, MD;  Location: [facility] ORS;  Service: Obstetrics;  Laterality: N/A;  ? TONSILLECTOMY AND ADENOIDECTOMY  2013  ? ?Family History  ?Problem Relation Age of Onset  ? Hyperlipidemia Mother   ? Hypertension Mother   ? Diabetes Mother   ? ?Social History  ? ?Tobacco Use  ? Smoking status: Former  ? Smokeless tobacco: Never  ?Vaping Use  ? Vaping Use: Never used  ?Substance Use Topics  ? Alcohol use: Not Currently  ?  Alcohol/week: 0.0 standard drinks  ?  Comment: occasioanlly  ? Drug use: No  ? ? ?Current Outpatient Medications:  ?  buPROPion (WELLBUTRIN XL) 300 MG 24 hr tablet, Take 1 tablet (300 mg total) by mouth daily., Disp: 90 tablet, Rfl: 1 ?  Multiple Vitamin (MULTIVITAMIN) capsule, Take 1 capsule by mouth daily., Disp: , Rfl:  ?  phentermine 30 MG capsule, Take 1 capsule (30 mg total) by mouth every morning. (Patient not taking: Reported on 08/07/2021), Disp: 30 capsule, Rfl: 0 ?  Potassium Citrate 15 MEQ (1620  MG) TBCR, Take 1 tablet by mouth in the morning and at bedtime. (Patient not taking: Reported on 08/07/2021), Disp: 60 tablet, Rfl: 11 ?  SUMAtriptan (IMITREX) 25 MG tablet, Take 1 tablet (25 mg total) by mouth once for 1 dose. May repeat in 2 hours if headache persists or recurs., Disp: 10 tablet, Rfl: 0 ?Allergies: Patient has no known allergies. ? ?ROS ? ?Objective: ?BP 112/60   Ht '5\' 5"'$  (1.651 m)   Wt 215 lb (97.5 kg)   LMP 07/10/2021 (Approximate)   BMI 35.78 kg/m?   ?Filed Weights  ? 08/07/21 0903  ?Weight: 215 lb (97.5 kg)  ? Body mass index is 35.78 kg/m?. ?OBGyn Exam ? ?Assessment:  ANNUAL EXAM ?1. Women's annual routine gynecological examination   ?2. CIN I (cervical intraepithelial neoplasia I)   ? ? ? ?Screening Plan: ?           ?1.  Cervical Screening-  Pap smear done today ? ?2. Breast screening- Exam annually and mammogram>40 planned  ? ?3. Colonoscopy every 10 years, Hemoccult testing - after age 58 ? ?70. Labs managed by PCP ? ?5. Counseling for contraception: no method ? ?The pregnancy intention screening data noted above was reviewed. Potential methods of contraception were discussed. The patient elected to proceed with Pregnant/Seeking Pregnancy. ? ?Discussed options for fertility  when desires to be checked out for this.  Still passively off of birth control, and [facility].  ? ?CIN I (cervical intraepithelial neoplasia I) ?- Cytology - PAP today ?Yearly if normal ? ? ?  F/U ? Return in about 1 year (around 08/08/2022) for and Annual when due. ? ?Laura Applebaum, MD, [place] ?[place], [place] ?08/07/2021  9:23 AM ?

## 2021-08-09 LAB — CYTOLOGY - PAP: Diagnosis: NEGATIVE

## 2021-08-11 ENCOUNTER — Encounter: Payer: Self-pay | Admitting: Obstetrics & Gynecology

## 2021-11-23 ENCOUNTER — Ambulatory Visit: Payer: Self-pay | Admitting: Adult Health

## 2021-12-19 ENCOUNTER — Other Ambulatory Visit: Payer: Self-pay

## 2021-12-19 DIAGNOSIS — F339 Major depressive disorder, recurrent, unspecified: Secondary | ICD-10-CM

## 2021-12-19 MED ORDER — BUPROPION HCL ER (XL) 300 MG PO TB24: 300.0000 mg | ORAL_TABLET | Freq: Every day | ORAL | 1 refills | Status: DC

## 2022-01-02 ENCOUNTER — Encounter: Payer: Self-pay | Admitting: Family Medicine

## 2022-01-02 ENCOUNTER — Ambulatory Visit (INDEPENDENT_AMBULATORY_CARE_PROVIDER_SITE_OTHER): Payer: Self-pay | Admitting: Family Medicine

## 2022-01-02 VITALS — BP 128/82 | HR 76 | Temp 97.9°F | Ht 65.0 in | Wt 222.6 lb

## 2022-01-02 DIAGNOSIS — G43709 Chronic migraine without aura, not intractable, without status migrainosus: Secondary | ICD-10-CM

## 2022-01-02 DIAGNOSIS — Z8669 Personal history of other diseases of the nervous system and sense organs: Secondary | ICD-10-CM

## 2022-01-02 DIAGNOSIS — F339 Major depressive disorder, recurrent, unspecified: Secondary | ICD-10-CM

## 2022-01-02 DIAGNOSIS — R634 Abnormal weight loss: Secondary | ICD-10-CM

## 2022-01-02 DIAGNOSIS — F32A Depression, unspecified: Secondary | ICD-10-CM

## 2022-01-02 MED ORDER — SUMATRIPTAN SUCCINATE 25 MG PO TABS: 25.0000 mg | ORAL_TABLET | ORAL | 0 refills | Status: DC | PRN

## 2022-01-02 MED ORDER — BUPROPION HCL ER (XL) 300 MG PO TB24: 300.0000 mg | ORAL_TABLET | Freq: Every day | ORAL | 1 refills | Status: DC

## 2022-01-02 NOTE — Progress Notes (Signed)
    SUBJECTIVE:   CHIEF COMPLAINT / HPI: establish care  Patient presents to clinic to establish care with new PCP.  No acute concerns  Depression Requesting refill for Wellbutrin.  Has been on medication for years.  Tried other medications without success.  Denies any SI/HI.    Migraines Without aura. Taking Imitrex but has become expensive.  Migraines not frequent.  Endorses photophobia, phonophobia, nausea with episodes.    PERTINENT  PMH / PSH:  Depression Migraines   OBJECTIVE:   BP 128/82 (BP Location: Left Arm, Patient Position: Sitting, Cuff Size: Normal)   Pulse 76   Temp 97.9 F (36.6 C) (Oral)   Ht '5\' 5"'$  (1.651 m)   Wt 222 lb 9.6 oz (101 kg)   LMP 12/25/2021   SpO2 99%   BMI 37.04 kg/m    General: Alert, no acute distress Cardio: Normal S1 and S2, RRR, no r/m/g Pulm: CTAB, normal work of breathing Abdomen: Bowel sounds normal. Abdomen soft and non-tender.  Extremities: No peripheral edema.    ASSESSMENT/PLAN:   Depression Chronic.  Doing well.  No SI//HI.  Tolerating current medication -Refill Wellbutrin XR 300 mg daily -Recommend therapy follow up -[facility] resources provided -Follow up with PCP as needed   Migraines History of migraines. Was taking Imitrex but unable to continue secondary to insurance. Discussed with patient use of GoodRx and cheaper with coupon. -Reorder Imitrex 25 mg, can take extra 25 mg 2 hrs after initial dose.  Max '50mg'$  24 hrs. --Monitor migraines and journal headaches.   -Consider Neurology consult for evaluation and would recommend UBrevly or Nurtec if meets requirements -Discussed with patient, prefers to wait for new insurance to be in place -Follow up with PCP as needed   PDMP reviewed  Carollee Leitz, MD [place]

## 2022-01-02 NOTE — Patient Instructions (Signed)
It was a pleasure meeting you today. Thank you for allowing me to take part in your health care.  Our goals for today as we discussed include:  For your migraines Refill Imitrex 25 mg tablet.  Take 1 tablet at onset of migraine.  May take second dose 2 hours later.  Do not exceed 50 mg in 24 hours.  Refill Wellbutrin 300 mg Recommend follow-up with therapy.  Please look into psychology [email or web address].  Please follow-up with PCP in October  If you have any questions or concerns, please do not hesitate to call the office at [phone].  I look forward to our next visit and until then take care and stay safe.  Regards,   Carollee Leitz, MD   [facility]

## 2022-01-07 ENCOUNTER — Encounter: Payer: Self-pay | Admitting: Family Medicine

## 2022-01-07 DIAGNOSIS — G43909 Migraine, unspecified, not intractable, without status migrainosus: Secondary | ICD-10-CM | POA: Insufficient documentation

## 2022-01-07 NOTE — Assessment & Plan Note (Signed)
Chronic.  Doing well.  No SI//HI.  Tolerating current medication -Refill Wellbutrin XR 300 mg daily -Recommend therapy follow up -[facility] resources provided -Follow up with PCP as needed

## 2022-01-07 NOTE — Assessment & Plan Note (Signed)
History of migraines. Was taking Imitrex but unable to continue secondary to insurance. Discussed with patient use of GoodRx and cheaper with coupon. -Reorder Imitrex 25 mg, can take extra 25 mg 2 hrs after initial dose.  Max '50mg'$  24 hrs. --Monitor migraines and journal headaches.   -Consider Neurology consult for evaluation and would recommend UBrevly or Nurtec if meets requirements -Discussed with patient, prefers to wait for new insurance to be in place -Follow up with PCP as needed

## 2022-03-05 ENCOUNTER — Ambulatory Visit: Payer: Self-pay | Admitting: Family Medicine

## 2022-04-02 ENCOUNTER — Encounter: Payer: Self-pay | Admitting: Family Medicine

## 2022-04-02 ENCOUNTER — Ambulatory Visit: Payer: BC Managed Care – PPO | Admitting: Family Medicine

## 2022-04-02 DIAGNOSIS — Z23 Encounter for immunization: Secondary | ICD-10-CM

## 2022-04-02 DIAGNOSIS — E6609 Other obesity due to excess calories: Secondary | ICD-10-CM

## 2022-04-02 DIAGNOSIS — Z6837 Body mass index (BMI) 37.0-37.9, adult: Secondary | ICD-10-CM | POA: Diagnosis not present

## 2022-04-02 DIAGNOSIS — Z1159 Encounter for screening for other viral diseases: Secondary | ICD-10-CM | POA: Diagnosis not present

## 2022-04-02 DIAGNOSIS — E669 Obesity, unspecified: Secondary | ICD-10-CM

## 2022-04-02 LAB — CBC WITH DIFFERENTIAL/PLATELET
Basophils Absolute: 0 10*3/uL (ref 0.0–0.1)
Basophils Relative: 0.7 % (ref 0.0–3.0)
Eosinophils Absolute: 0.1 10*3/uL (ref 0.0–0.7)
Eosinophils Relative: 1.7 % (ref 0.0–5.0)
HCT: 35.2 % — ABNORMAL LOW (ref 36.0–46.0)
Hemoglobin: 12.1 g/dL (ref 12.0–15.0)
Lymphocytes Relative: 20 % (ref 12.0–46.0)
Lymphs Abs: 0.9 10*3/uL (ref 0.7–4.0)
MCHC: 34.3 g/dL (ref 30.0–36.0)
MCV: 88.7 fl (ref 78.0–100.0)
Monocytes Absolute: 0.4 10*3/uL (ref 0.1–1.0)
Monocytes Relative: 8.2 % (ref 3.0–12.0)
Neutro Abs: 3.1 10*3/uL (ref 1.4–7.7)
Neutrophils Relative %: 69.4 % (ref 43.0–77.0)
Platelets: 225 10*3/uL (ref 150.0–400.0)
RBC: 3.97 Mil/uL (ref 3.87–5.11)
RDW: 13.6 % (ref 11.5–15.5)
WBC: 4.5 10*3/uL (ref 4.0–10.5)

## 2022-04-02 LAB — COMPREHENSIVE METABOLIC PANEL
ALT: 15 U/L (ref 0–35)
AST: 12 U/L (ref 0–37)
Albumin: 4.1 g/dL (ref 3.5–5.2)
Alkaline Phosphatase: 33 U/L — ABNORMAL LOW (ref 39–117)
BUN: 11 mg/dL (ref 6–23)
CO2: 26 mEq/L (ref 19–32)
Calcium: 9 mg/dL (ref 8.4–10.5)
Chloride: 105 mEq/L (ref 96–112)
Creatinine, Ser: 0.66 mg/dL (ref 0.40–1.20)
GFR: 111.4 mL/min (ref >60.00)
Glucose, Bld: 90 mg/dL (ref 70–99)
Potassium: 3.8 mEq/L (ref 3.5–5.1)
Sodium: 138 mEq/L (ref 135–145)
Total Bilirubin: 0.5 mg/dL (ref 0.2–1.2)
Total Protein: 5.9 g/dL — ABNORMAL LOW (ref 6.0–8.3)

## 2022-04-02 LAB — LIPID PANEL
Cholesterol: 218 mg/dL — ABNORMAL HIGH (ref 0–200)
HDL: 43.8 mg/dL (ref >39.00)
LDL Cholesterol: 144 mg/dL — ABNORMAL HIGH (ref 0–99)
NonHDL: 174.55
Total CHOL/HDL Ratio: 5
Triglycerides: 152 mg/dL — ABNORMAL HIGH (ref 0.0–149.0)
VLDL: 30.4 mg/dL (ref 0.0–40.0)

## 2022-04-02 LAB — HEMOGLOBIN A1C: Hgb A1c MFr Bld: 5.1 % (ref 4.6–6.5)

## 2022-04-02 LAB — VITAMIN D 25 HYDROXY (VIT D DEFICIENCY, FRACTURES): VITD: 35.24 ng/mL (ref 30.00–100.00)

## 2022-04-02 LAB — VITAMIN B12: Vitamin B-12: 259 pg/mL (ref 211–911)

## 2022-04-02 LAB — TSH: TSH: 1.58 u[IU]/mL (ref 0.35–5.50)

## 2022-04-02 MED ORDER — SEMAGLUTIDE-WEIGHT MANAGEMENT 0.5 MG/0.5ML ~~LOC~~ SOAJ: 0.5000 mg | SUBCUTANEOUS | 0 refills | Status: AC

## 2022-04-02 MED ORDER — SEMAGLUTIDE-WEIGHT MANAGEMENT 1 MG/0.5ML ~~LOC~~ SOAJ: 1.0000 mg | SUBCUTANEOUS | 0 refills | Status: AC

## 2022-04-02 MED ORDER — SEMAGLUTIDE-WEIGHT MANAGEMENT 2.4 MG/0.75ML ~~LOC~~ SOAJ: 2.4000 mg | SUBCUTANEOUS | 0 refills | Status: AC

## 2022-04-02 MED ORDER — SEMAGLUTIDE-WEIGHT MANAGEMENT 1.7 MG/0.75ML ~~LOC~~ SOAJ: 1.7000 mg | SUBCUTANEOUS | 0 refills | Status: AC

## 2022-04-02 MED ORDER — SEMAGLUTIDE-WEIGHT MANAGEMENT 0.25 MG/0.5ML ~~LOC~~ SOAJ: 0.2500 mg | SUBCUTANEOUS | 0 refills | Status: AC

## 2022-04-02 NOTE — Progress Notes (Addendum)
    SUBJECTIVE:   CHIEF COMPLAINT / HPI: annual visit/weight loss  Patient presents to clinic to discuss weight loss and for labs  Weight management Has been having difficulty with weight los management.  Ongoing concern.  Tried previous diets but unable to maintain.  Would like assistance with medications to help kick start loss.  Motivated for lifestyle change.   PERTINENT  PMH / PSH:  Mood disorder Migraines Obesity class 2  OBJECTIVE:   BP 122/78 (BP Location: Left Arm, Patient Position: Sitting, Cuff Size: Normal)   Pulse (!) 101   Temp 97.7 F (36.5 C) (Oral)   Ht '5\' 5"'$  (1.651 m)   Wt 224 lb 9.6 oz (101.9 kg)   LMP 03/19/2022   SpO2 99%   BMI 37.38 kg/m    General: Alert, no acute distress Cardio: Normal S1 and S2, RRR, no r/m/g Pulm: CTAB, normal work of breathing Abdomen: Bowel sounds normal. Abdomen soft and non-tender.    ASSESSMENT/PLAN:   Class 2 obesity Chronic. Discussed in detail nutrition, lifestyle changes, goals and side effects of medications.  No history of thyroid cancer or pancreatitis.  -TSH, CBC, A1c, Vit D, Vit B12,  Lipids, Cmet today -Food journal -Wegovy 0.25 mg subcutaneous weekly x 4 weeks, titrate as prescribed -Could consider combination of Phentermine/Topiramate in future. -Follow up in 4 weeks   HCM Flu vaccine today Hepatitis C screening today   PDMP Reviewed  Carollee Leitz, MD

## 2022-04-02 NOTE — Patient Instructions (Addendum)
It was a pleasure meeting you today. Thank you for allowing me to take part in your health care.  Our goals for today as we discussed include:  For your weight loss We will get some labs today.  If they are abnormal or we need to do something about them, I will call you.  If they are normal, I will send you a message on MyChart (if it is active) or a letter in the mail.  If you don't hear from [place] in 2 weeks, please call the office at the number below.   Look at Healthy Weight and Wellness website to see if this may be an option for you with your weight loss. Healthy Weight [facility] Wellness [place] [phone]   Recommend to record you food and beverage intake for 24-48 hours.   For your Mood Glad you are doing well Continue Wellbutrin  Therapy as needed  For [place] [phone] Address: [address], [place] [address] Hours: Open 24/7, No appointment required.   You have received the Flu vaccine today   Please follow-up with PCP in 3 months or sooner if needed  If you have any questions or concerns, please do not hesitate to call the office at [phone].  I look forward to our next visit and until then take care and stay safe.  Regards,   Carollee Leitz, MD   [facility] Eating Following a healthy eating pattern may help you to achieve and maintain a healthy body weight, reduce the risk of chronic disease, and live a long and productive life. It is important to follow a healthy eating pattern at an appropriate calorie level for your body. Your nutritional needs should be met primarily through food by choosing a variety of nutrient-rich foods. What are tips for following this plan? Reading food labels Read labels and choose the following: Reduced or low sodium. Juices with 100% fruit juice. Foods with low saturated fats and high polyunsaturated and monounsaturated  fats. Foods with whole grains, such as whole wheat, cracked wheat, brown rice, and wild rice. Whole grains that are fortified with folic acid. This is recommended for women who are pregnant or who want to become pregnant. Read labels and avoid the following: Foods with a lot of added sugars. These include foods that contain brown sugar, corn sweetener, corn syrup, dextrose, fructose, glucose, high-fructose corn syrup, honey, invert sugar, lactose, malt syrup, maltose, molasses, raw sugar, sucrose, trehalose, or turbinado sugar. Do not eat more than the following amounts of added sugar per day: 6 teaspoons (25 g) for women. 9 teaspoons (38 g) for men. Foods that contain processed or refined starches and grains. Refined grain products, such as white flour, degermed cornmeal, white bread, and white rice. Shopping Choose nutrient-rich snacks, such as vegetables, whole fruits, and nuts. Avoid high-calorie and high-sugar snacks, such as potato chips, fruit snacks, and candy. Use oil-based dressings and spreads on foods instead of solid fats such as butter, stick margarine, or cream cheese. Limit pre-made sauces, mixes, and "instant" products such as flavored rice, instant noodles, and ready-made pasta. Try more plant-protein sources, such as tofu, tempeh, black beans, edamame, lentils, nuts, and seeds. Explore eating plans such as the Mediterranean diet or vegetarian diet. Cooking Use oil to saut or stir-fry foods instead of solid fats such as butter, stick margarine, or lard. Try baking, boiling, grilling, or broiling instead of frying. Remove the  fatty part of meats before cooking. Steam vegetables in water or broth. Meal planning  At meals, imagine dividing your plate into fourths: One-half of your plate is fruits and vegetables. One-fourth of your plate is whole grains. One-fourth of your plate is protein, especially lean meats, poultry, eggs, tofu, beans, or nuts. Include low-fat dairy as  part of your daily diet. Lifestyle Choose healthy options in all settings, including home, work, school, restaurants, or stores. Prepare your food safely: Wash your hands after handling raw meats. Keep food preparation surfaces clean by regularly washing with hot, soapy water. Keep raw meats separate from ready-to-eat foods, such as fruits and vegetables. Cook seafood, meat, poultry, and eggs to the recommended internal temperature. Store foods at safe temperatures. In general: Keep cold foods at 52F (4.4C) or below. Keep hot foods at 152F (60C) or above. Keep your freezer at [facility] (-17.8C) or below. Foods are no longer safe to eat when they have been between the temperatures of 40-152F (4.4-60C) for more than 2 hours. What foods should I eat? Fruits Aim to eat 2 cup-equivalents of fresh, canned (in natural juice), or frozen fruits each day. Examples of 1 cup-equivalent of fruit include 1 small apple, 8 large strawberries, 1 cup canned fruit,  cup dried fruit, or 1 cup 100% juice. Vegetables Aim to eat 2-3 cup-equivalents of fresh and frozen vegetables each day, including different varieties and colors. Examples of 1 cup-equivalent of vegetables include 2 medium carrots, 2 cups raw, leafy greens, 1 cup chopped vegetable (raw or cooked), or 1 medium baked potato. Grains Aim to eat 6 ounce-equivalents of whole grains each day. Examples of 1 ounce-equivalent of grains include 1 slice of bread, 1 cup ready-to-eat cereal, 3 cups popcorn, or  cup cooked rice, pasta, or cereal. Meats and other proteins Aim to eat 5-6 ounce-equivalents of protein each day. Examples of 1 ounce-equivalent of protein include 1 egg, 1/2 cup nuts or seeds, or 1 tablespoon (16 g) peanut butter. A cut of meat or fish that is the size of a deck of cards is about 3-4 ounce-equivalents. Of the protein you eat each week, try to have at least 8 ounces come from seafood. This includes salmon, trout, herring, and  anchovies. Dairy Aim to eat 3 cup-equivalents of fat-free or low-fat dairy each day. Examples of 1 cup-equivalent of dairy include 1 cup (240 mL) milk, 8 ounces (250 g) yogurt, 1 ounces (44 g) natural cheese, or 1 cup (240 mL) fortified soy milk. Fats and oils Aim for about 5 teaspoons (21 g) per day. Choose monounsaturated fats, such as canola and olive oils, avocados, peanut butter, and most nuts, or polyunsaturated fats, such as sunflower, corn, and soybean oils, walnuts, pine nuts, sesame seeds, sunflower seeds, and flaxseed. Beverages Aim for six 8-oz glasses of water per day. Limit coffee to three to five 8-oz cups per day. Limit caffeinated beverages that have added calories, such as soda and energy drinks. Limit alcohol intake to no more than 1 drink a day for nonpregnant women and 2 drinks a day for men. One drink equals 12 oz of beer (355 mL), 5 oz of wine (148 mL), or 1 oz of hard liquor (44 mL). Seasoning and other foods Avoid adding excess amounts of salt to your foods. Try flavoring foods with herbs and spices instead of salt. Avoid adding sugar to foods. Try using oil-based dressings, sauces, and spreads instead of solid fats. This information is based on general [place] nutrition guidelines. For  more information, visit [employer]. Exact amounts may vary based on your nutrition needs. Summary A healthy eating plan may help you to maintain a healthy weight, reduce the risk of chronic diseases, and stay active throughout your life. Plan your meals. Make sure you eat the right portions of a variety of nutrient-rich foods. Try baking, boiling, grilling, or broiling instead of frying. Choose healthy options in all settings, including home, work, school, restaurants, or stores. This information is not intended to replace advice given to you by your health care provider. Make sure you discuss any questions you have with your health care provider. Document Revised: 11/01/2021  Document Reviewed: 01/17/2021 Elsevier Patient Education  [place].

## 2022-04-03 LAB — HEPATITIS C ANTIBODY: Hepatitis C Ab: NONREACTIVE

## 2022-04-05 ENCOUNTER — Encounter: Payer: Self-pay | Admitting: Family Medicine

## 2022-04-09 NOTE — Telephone Encounter (Signed)
No other medications.  Can monitor diet and reduce calories.  Plan for small goals. Laura Wiggins has been ordered

## 2022-04-13 IMAGING — CT CT RENAL STONE PROTOCOL
2 of 4 series · 16 of 46 positions shown, 18 images · non-contrast
Comparison: 08/19/2019

CLINICAL DATA: Left-sided flank pain

EXAM:
CT ABDOMEN AND PELVIS WITHOUT CONTRAST
TECHNIQUE: Multidetector CT imaging of the abdomen and pelvis was performed
following the standard protocol without IV contrast.

[Series 2: stone full standard · axial · 0.94mm/px · z∈[-964,-508]mm · 13 of 101 slices shown, 15 images]
[im 5/101  soft-tissue]
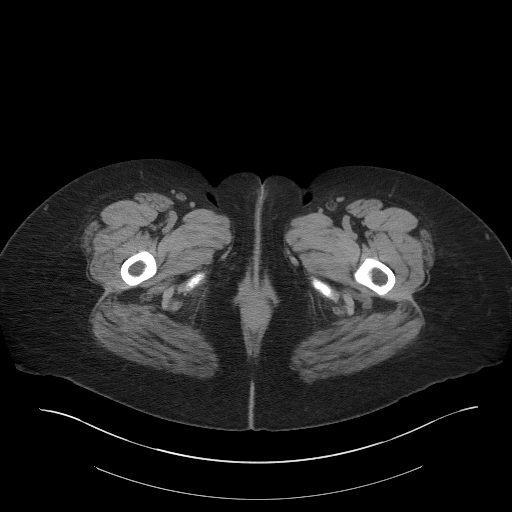
[im 5/101  bone]
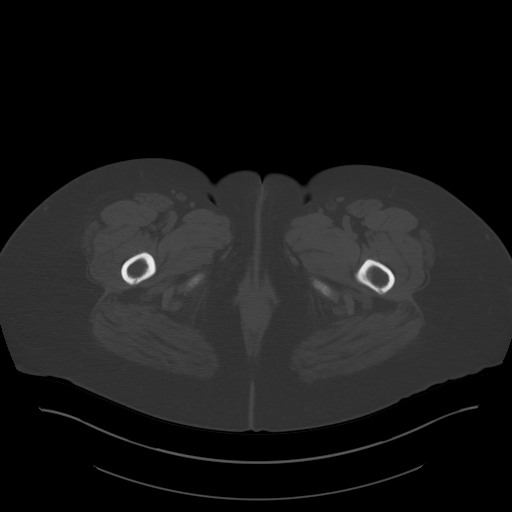
[im 14/101  soft-tissue]
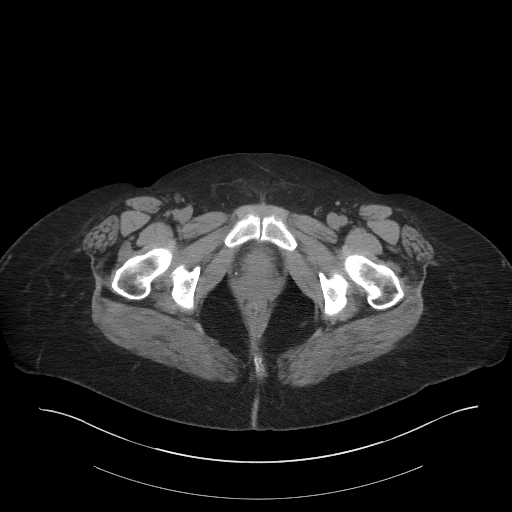
[im 22/101  soft-tissue]
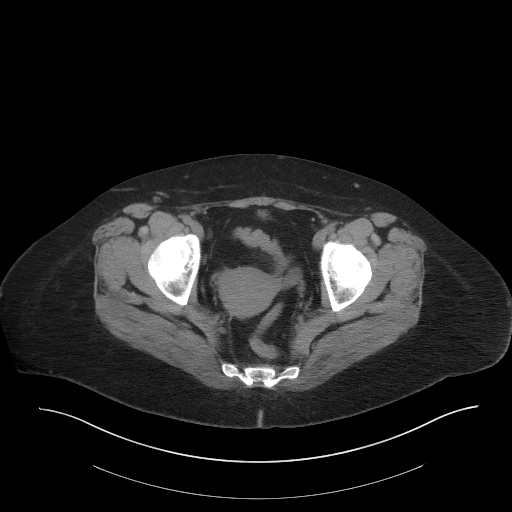
[im 27/101  soft-tissue]
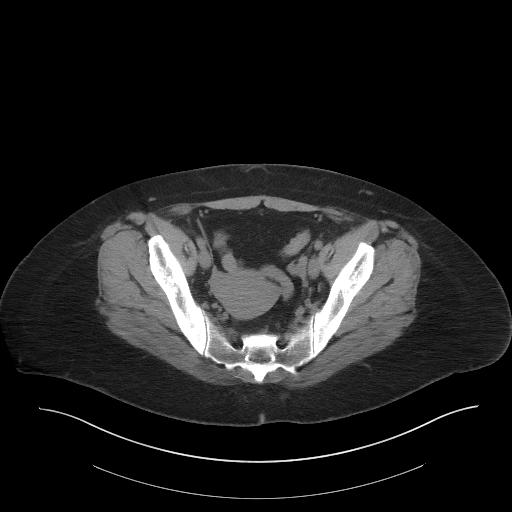
[im 35/101  soft-tissue]
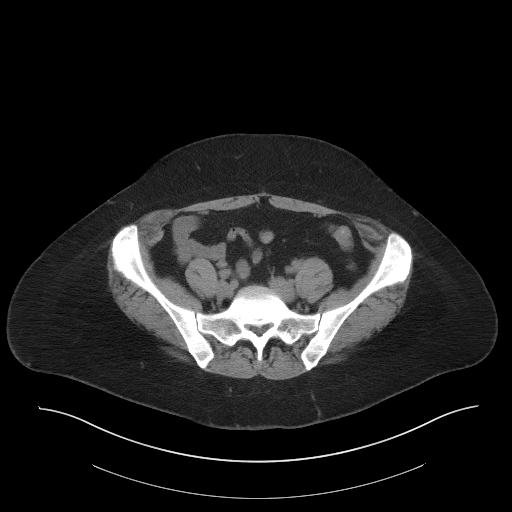
[im 44/101  soft-tissue]
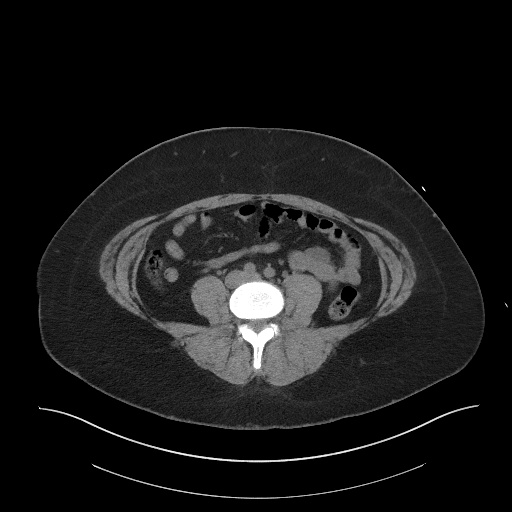
[im 53/101  soft-tissue]
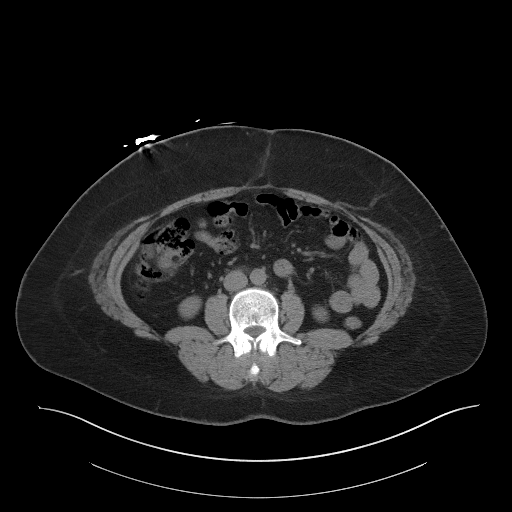
[im 57/101  soft-tissue]
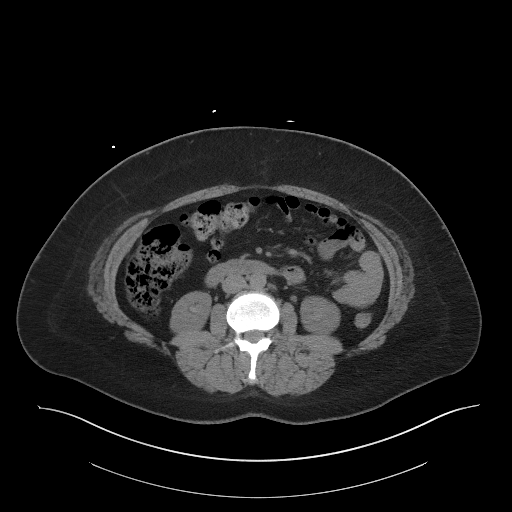
[im 66/101  soft-tissue]
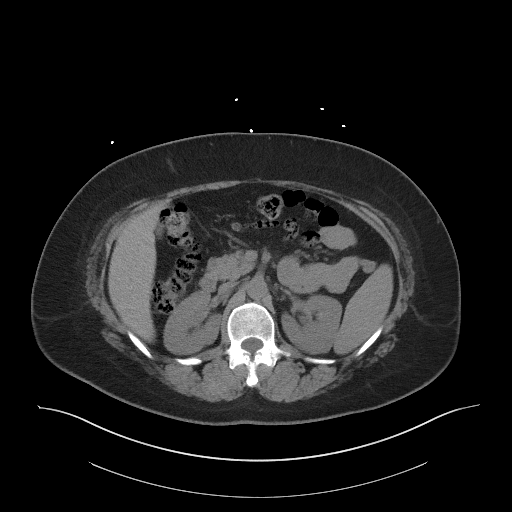
[im 66/101  bone]
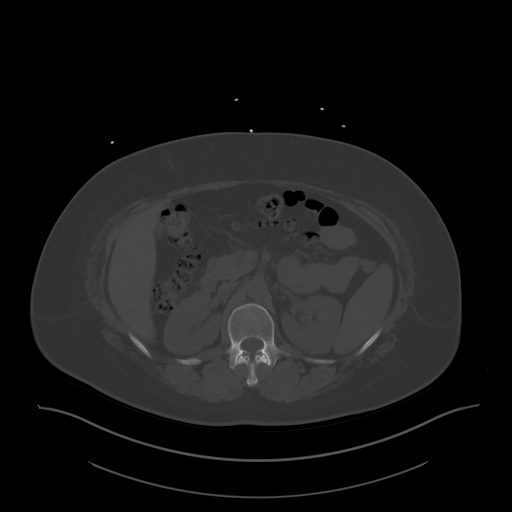
[im 74/101  soft-tissue]
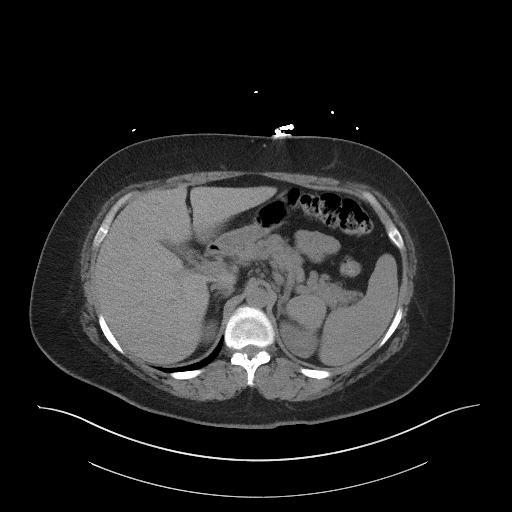
[im 79/101  soft-tissue]
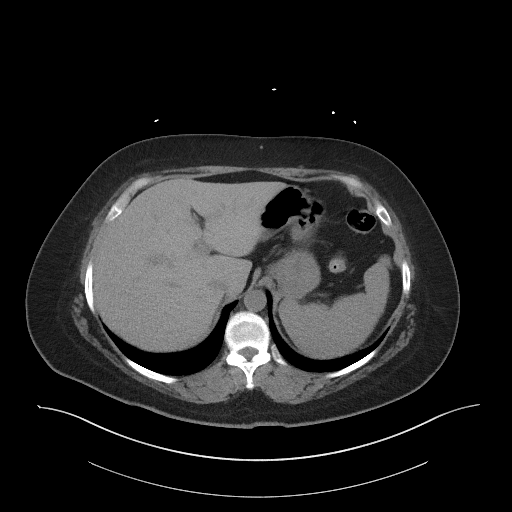
[im 87/101  soft-tissue]
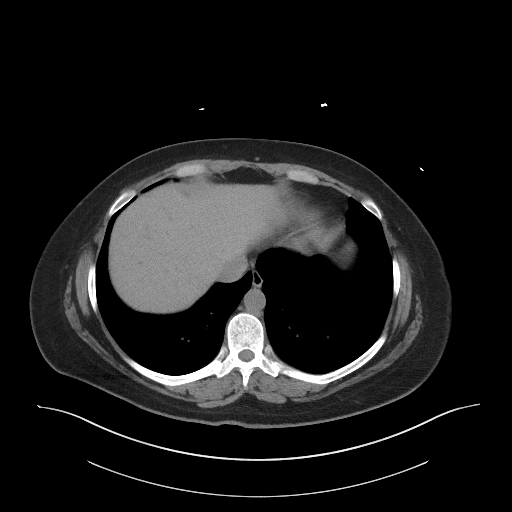
[im 96/101  soft-tissue]
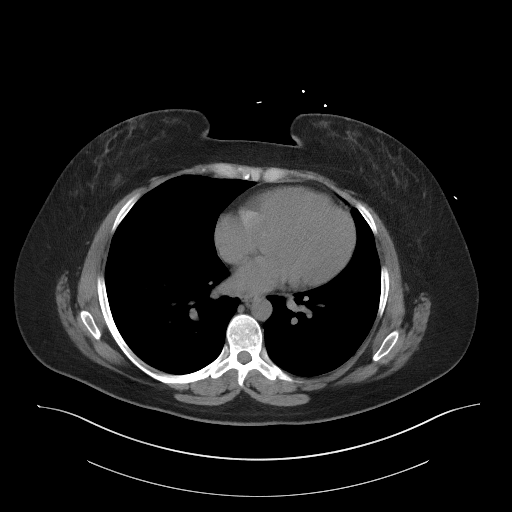

[Series 5: coronal · coronal · 0.83mm/px · 3 of 153 slices shown]
[im 51/153  soft-tissue]
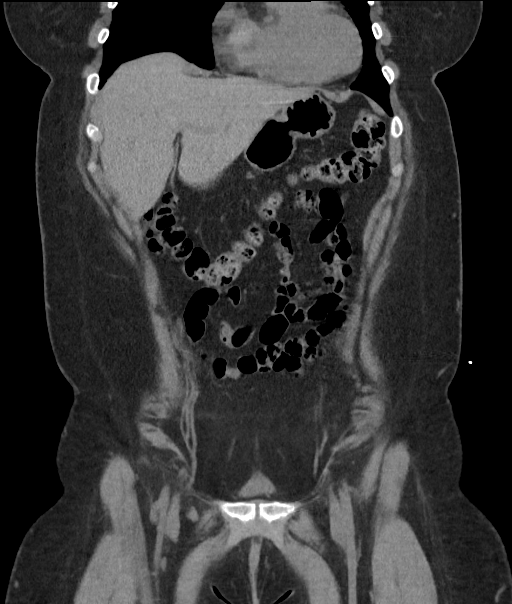
[im 68/153  soft-tissue]
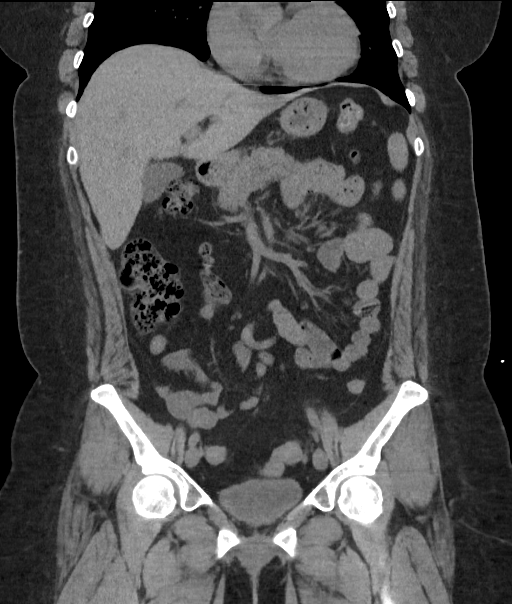
[im 85/153  soft-tissue]
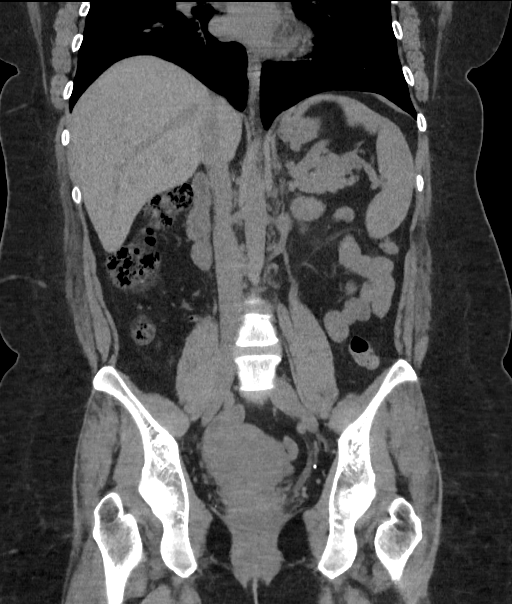

[16 of 46 positions shown; findings below may reference images not displayed]

FINDINGS: LOWER CHEST: Normal.

HEPATOBILIARY: Normal hepatic contours. No intra- or extrahepatic
biliary dilatation. Normal gallbladder.

PANCREAS: Normal pancreas. No ductal dilatation or peripancreatic
fluid collection.

SPLEEN: Normal.

ADRENALS/URINARY TRACT: The adrenal glands are normal. 2 mm
nonobstructive stone at the right renal pelvis. There is mild left
hydroureteronephrosis with an obstructing stone at the left
ureterovesical junction measuring 4 mm. The urinary bladder is
normal for degree of distention

STOMACH/BOWEL: There is no hiatal hernia. Normal duodenal course and
caliber. No small bowel dilatation or inflammation. No focal colonic
abnormality. Normal appendix.

VASCULAR/LYMPHATIC: Normal course and caliber of the major abdominal
vessels. No abdominal or pelvic lymphadenopathy.

REPRODUCTIVE: Normal uterus. No adnexal mass.

MUSCULOSKELETAL. No bony spinal canal stenosis or focal osseous
abnormality.

OTHER: None.
IMPRESSION: 1. Mild left hydroureteronephrosis with 4 mm obstructing stone at
the left ureterovesical junction.
2. 2 mm nonobstructive stone at the right renal pelvis.

## 2022-04-15 ENCOUNTER — Encounter: Payer: Self-pay | Admitting: Family Medicine

## 2022-04-15 NOTE — Assessment & Plan Note (Addendum)
Chronic. Discussed in detail nutrition, lifestyle changes, goals and side effects of medications.  No history of thyroid cancer or pancreatitis.  -TSH, CBC, A1c, Vit D, Vit B12,  Lipids, Cmet today -Food journal -Wegovy 0.25 mg subcutaneous weekly x 4 weeks, titrate as prescribed -Could consider combination of Phentermine/Topiramate in future. -Follow up in 4 weeks

## 2022-07-04 ENCOUNTER — Ambulatory Visit: Payer: BC Managed Care – PPO | Admitting: Family Medicine

## 2022-07-04 ENCOUNTER — Encounter: Payer: Self-pay | Admitting: Family Medicine

## 2022-07-04 VITALS — BP 118/76 | HR 87 | Temp 98.0°F | Resp 16 | Ht 65.0 in | Wt 228.5 lb

## 2022-07-04 DIAGNOSIS — F339 Major depressive disorder, recurrent, unspecified: Secondary | ICD-10-CM | POA: Diagnosis not present

## 2022-07-04 DIAGNOSIS — E669 Obesity, unspecified: Secondary | ICD-10-CM | POA: Diagnosis not present

## 2022-07-04 MED ORDER — BUPROPION HCL ER (XL) 300 MG PO TB24: 300.0000 mg | ORAL_TABLET | Freq: Every day | ORAL | 3 refills | Status: DC

## 2022-07-04 NOTE — Progress Notes (Signed)
   SUBJECTIVE:   Chief Complaint  Patient presents with   Obesity   HPI Patient presents to clinic for follow-up weight loss management.  Had not been able to pick up [facility] as pharmacy had not called to notify her.  She had not called pharmacy for prescription.  Appears that prescription has been on order but not received yet.  Weight has increased since last visit.  No change in diet or increase in activity.  Has had some issues with right knee pain and plans to see orthopedics for possible injection.   Mood disorder Asymptomatic.  Doing well on current medications.  Needs refill of Wellbutrin.  PERTINENT PMH / PSH: Class II obesity  OBJECTIVE:  BP 118/76   Pulse 87   Temp 98 F (36.7 C)   Resp 16   Ht '5\' 5"'$  (1.651 m)   Wt 228 lb 8 oz (103.6 kg)   LMP 06/05/2022 (Approximate)   SpO2 99%   BMI 38.02 kg/m    Physical Exam Vitals reviewed.  Constitutional:      General: She is not in acute distress.    Appearance: Normal appearance. She is normal weight. She is not ill-appearing, toxic-appearing or diaphoretic.  Eyes:     General:        Right eye: No discharge.        Left eye: No discharge.     Conjunctiva/sclera: Conjunctivae normal.  Cardiovascular:     Rate and Rhythm: Normal rate and regular rhythm.     Heart sounds: Normal heart sounds.  Pulmonary:     Effort: Pulmonary effort is normal.     Breath sounds: Normal breath sounds.  Abdominal:     General: Bowel sounds are normal.  Musculoskeletal:        General: Normal range of motion.  Skin:    General: Skin is warm and dry.  Neurological:     General: No focal deficit present.     Mental Status: She is alert and oriented to person, place, and time. Mental status is at baseline.  Psychiatric:        Mood and Affect: Mood normal.        Behavior: Behavior normal.        Thought Content: Thought content normal.        Judgment: Judgment normal.     ASSESSMENT/PLAN:  Depression, recurrent  (HCC) Assessment & Plan: Chronic.  Doing well.  No SI//HI.  Tolerating current medication -Refill Wellbutrin XR 300 mg daily    Orders: -     buPROPion HCl ER (XL); Take 1 tablet (300 mg total) by mouth daily.  Dispense: 90 tablet; Refill: 3  Class 2 obesity Assessment & Plan: Chronic.  Weight is increased almost 5 pounds since last visit.  Unable to obtain [facility] and initiate medication. I have requested patient to check with pharmacy to see if able to obtain. Could consider Zepbound weekly injectable.  Would have patient call insurance to clarify if medication covered. Once able to start medication will need to follow-up 4 to 6 weeks after initiation to assess.     PDMP reviewed  Return if symptoms worsen or fail to improve.  Carollee Leitz, MD

## 2022-07-04 NOTE — Patient Instructions (Addendum)
It was a pleasure meeting you today. Thank you for allowing me to take part in your health care.  Our goals for today as we discussed include:  Refill Wellbutrin  Call [facility] to check on your prescription for semaglutide and have transfer to [facility].    If you need a new prescription please message me through [email or web address] Address: [address], [place], [place] [address] Hours:  Open ? Closes 9?PM Phone: [phone]  If you have any questions or concerns, please do not hesitate to call the office at [phone].  I look forward to our next visit and until then take care and stay safe.  Regards,   Carollee Leitz, MD   [facility]

## 2022-07-06 ENCOUNTER — Encounter: Payer: Self-pay | Admitting: Family Medicine

## 2022-07-06 HISTORY — DX: Morbid (severe) obesity due to excess calories: E66.01

## 2022-07-06 NOTE — Assessment & Plan Note (Signed)
Chronic.  Doing well.  No SI//HI.  Tolerating current medication -Refill Wellbutrin XR 300 mg daily

## 2022-07-06 NOTE — Assessment & Plan Note (Signed)
Chronic.  Weight is increased almost 5 pounds since last visit.  Unable to obtain [facility] and initiate medication. I have requested patient to check with pharmacy to see if able to obtain. Could consider Zepbound weekly injectable.  Would have patient call insurance to clarify if medication covered. Once able to start medication will need to follow-up 4 to 6 weeks after initiation to assess.

## 2022-07-16 DIAGNOSIS — M79671 Pain in right foot: Secondary | ICD-10-CM | POA: Diagnosis not present

## 2022-07-16 DIAGNOSIS — M25561 Pain in right knee: Secondary | ICD-10-CM | POA: Diagnosis not present

## 2022-07-25 DIAGNOSIS — M25561 Pain in right knee: Secondary | ICD-10-CM | POA: Diagnosis not present

## 2022-07-30 DIAGNOSIS — M2241 Chondromalacia patellae, right knee: Secondary | ICD-10-CM | POA: Diagnosis not present

## 2022-08-15 ENCOUNTER — Ambulatory Visit: Payer: BC Managed Care – PPO | Admitting: Family Medicine

## 2022-11-28 ENCOUNTER — Encounter: Payer: Self-pay | Admitting: Family Medicine

## 2022-12-10 ENCOUNTER — Encounter: Payer: Self-pay | Admitting: Family Medicine

## 2022-12-13 ENCOUNTER — Ambulatory Visit: Payer: BC Managed Care – PPO | Admitting: Family Medicine

## 2022-12-18 ENCOUNTER — Encounter: Payer: Self-pay | Admitting: Family Medicine

## 2022-12-19 ENCOUNTER — Ambulatory Visit: Payer: BC Managed Care – PPO | Admitting: Family Medicine

## 2022-12-25 ENCOUNTER — Ambulatory Visit: Payer: BC Managed Care – PPO | Admitting: Family Medicine

## 2022-12-25 VITALS — BP 124/82 | HR 91 | Temp 98.0°F | Ht 65.0 in | Wt 224.0 lb

## 2022-12-25 DIAGNOSIS — R634 Abnormal weight loss: Secondary | ICD-10-CM

## 2022-12-25 DIAGNOSIS — G479 Sleep disorder, unspecified: Secondary | ICD-10-CM

## 2022-12-25 DIAGNOSIS — Z8669 Personal history of other diseases of the nervous system and sense organs: Secondary | ICD-10-CM | POA: Diagnosis not present

## 2022-12-25 DIAGNOSIS — R600 Localized edema: Secondary | ICD-10-CM | POA: Diagnosis not present

## 2022-12-25 DIAGNOSIS — G43709 Chronic migraine without aura, not intractable, without status migrainosus: Secondary | ICD-10-CM | POA: Diagnosis not present

## 2022-12-25 MED ORDER — SUMATRIPTAN SUCCINATE 25 MG PO TABS: 25.0000 mg | ORAL_TABLET | ORAL | 1 refills | Status: DC | PRN

## 2022-12-25 NOTE — Progress Notes (Signed)
SUBJECTIVE:   Chief Complaint  Patient presents with   Insomnia    Pt states she hasn't been able for a weeks.    Edema    Pt states has been getting worse when she is standing and walking.    HPI Patient presents to clinic for acute visit  Sleeping concerns Reports unable to sleep for more than 3-4 hours a night.  Feels exhausted.  She has recently changed her work schedule to accommodate her family's schedule.  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  During her off shifts she does not sleep well during the daylight hours.  On her 8 day stretch she looks after her family but is still lacking sleep.  Has not been able to have a good sleep routine.  She is taking Wellbutrin XR 30 mg prior to bedtime, in the am, which has a side effect of insomnia.    Bilateral lower extremity edema Chronic issue.  Denies any shortness of breath, heart palpitations, chest pain or lower calf pain. Swelling decreases when legs elevated.  Poor nutrition and decreased amount of protein in diet.  Weight has decreased 4 lbs since last visit.   Migraines Requesting refills for Imitrex.  PERTINENT PMH / PSH: Mood Disorder Obesity Class 2 History of Migraines  OBJECTIVE:  BP 124/82 (BP Location: Left Arm, Patient Position: Sitting, Cuff Size: Normal)   Pulse 91   Temp 98 F (36.7 C) (Oral)   Ht 5\' 5"  (1.651 m)   Wt 224 lb (101.6 kg)   LMP 12/12/2022 (Exact Date)   SpO2 98%   BMI 37.28 kg/m    Physical Exam Vitals reviewed.  Constitutional:      General: She is not in acute distress.    Appearance: Normal appearance. She is normal weight. She is not ill-appearing, toxic-appearing or diaphoretic.  Eyes:     General:        Right eye: No discharge.        Left eye: No discharge.     Conjunctiva/sclera: Conjunctivae normal.  Cardiovascular:     Rate and Rhythm: Normal rate.  Pulmonary:      Effort: Pulmonary effort is normal.  Musculoskeletal:        General: Normal range of motion.  Skin:    General: Skin is warm and dry.  Neurological:     Mental Status: She is alert and oriented to person, place, and time. Mental status is at baseline.  Psychiatric:        Mood and Affect: Mood normal.        Behavior: Behavior normal.        Thought Content: Thought content normal.        Judgment: Judgment normal.        12/25/2022    8:58 AM 07/04/2022    1:46 PM 04/02/2022   10:28 AM 01/02/2022   10:22 AM 05/25/2021    9:41 AM  Depression screen PHQ 2/9  Decreased Interest 0 0 0 0 1  Down, Depressed, Hopeless 1 1 0 1 0  PHQ - 2 Score 1 1 0 1 1  Altered sleeping  1 2  1   Tired, decreased energy  0 1  1  Change in appetite  0 0  0  Feeling bad or failure about yourself   1 0  0  Trouble concentrating  0 0  0  Moving slowly or fidgety/restless  0 0  0  Suicidal thoughts  0 0  0  PHQ-9 Score  3 3  3   Difficult doing work/chores  Not difficult at all Not difficult at all        12/25/2022    8:58 AM 07/04/2022    1:46 PM 04/02/2022   10:28 AM 06/08/2020    2:11 PM  GAD 7 : Generalized Anxiety Score  Nervous, Anxious, on Edge 2 1 0 1  Control/stop worrying 1 0 0 1  Worry too much - different things 1 1 1 1   Trouble relaxing 1 0 0 1  Restless 1 0 1 1  Easily annoyed or irritable 1 0 0 0  Afraid - awful might happen 0 0 0 0  Total GAD 7 Score 7 2 2 5   Anxiety Difficulty Not difficult at all Not difficult at all Not difficult at all Not difficult at all    ASSESSMENT/PLAN:  Sleep disorder Assessment & Plan: New problem Likely due to adjustment in new schedule Recommend she revisit her work schedule and decrease length of hours working daily from 16 hrs to 12 hrs max.  May need to increase shifts and decrease length of consecutive days off Could be side effect of Wellbutrin.  Recommend she switch times taking medication to before work rather than in am prior to  sleep. Provided sleep hygiene education If no improvement consider sleep studies Can consider OTC Melatonin at night   History of migraine -     SUMAtriptan Succinate; Take 1 tablet (25 mg total) by mouth every 2 (two) hours as needed for migraine. May repeat in 2 hours if headache persists or recurs.  Dispense: 30 tablet; Refill: 1  Bilateral lower extremity edema Assessment & Plan: 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 Edema works after standing and has decreased upon awakening. Recommend compression stockings at work Elevate legs when possible Encourage weight loss and healthy lifestyle    Chronic migraine without aura without status migrainosus, not intractable Assessment & Plan: Chronic.   Refill Imitrex 25 mg  Discussed with patient that lack of sleep, nutrition and low H2O intake can increase recurrence.     PDMP reviewed  Return if symptoms worsen or fail to improve, for PCP.  Dana Allan, MD

## 2022-12-25 NOTE — Patient Instructions (Addendum)
It was a pleasure meeting you today. Thank you for allowing me to take part in your health care.  Our goals for today as we discussed include:  Don't Sleep More, Sleep BETTER  Choose any or all of the following ways to help improve the quality of your sleep Go to sleep and wake up at the same time every day Use your bed for only sleep and sexual activity Bedtime routine Avoid moderate to vigorous exercise at least 2-3 hours before bedtime Avoid caffeine (tea, coffee, chocolate, soft drinks) at least 4 hours before bedtime. Refrain from drinking alcohol or smoking at least 3-4 hours before bedtime Do not take unprescribed over the counter sleeping pills. Avoid napping.  If you must nap, limit time to 20-30 minutes Make your sleeping environment comfortable and relaxing (avoid too much light, disturbing noises, using electronics 30 min before bed, keep temperature cooler) Avoid large meals or spicy food 2-3 hours before bed Talk to doctor if you still have trouble sleeping.     Use compression stockings daily Ensure use of supportive shoes Increase protein in diet Elevate legs when sitting Continue weight loss and increase activity  Refill sent for Imitrex  Follow up as needed  If you have any questions or concerns, please do not hesitate to call the office at [phone].  I look forward to our next visit and until then take care and stay safe.  Regards,   Dana Allan, MD   [facility]

## 2023-01-06 ENCOUNTER — Encounter: Payer: Self-pay | Admitting: Family Medicine

## 2023-01-06 DIAGNOSIS — G479 Sleep disorder, unspecified: Secondary | ICD-10-CM | POA: Insufficient documentation

## 2023-01-06 NOTE — Assessment & Plan Note (Addendum)
Chronic.   Refill Imitrex 25 mg  Discussed with patient that lack of sleep, nutrition and low H2O intake can increase recurrence.

## 2023-01-06 NOTE — Assessment & Plan Note (Signed)
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 Edema works after standing and has decreased upon awakening. Recommend compression stockings at work Elevate legs when possible Encourage weight loss and healthy lifestyle

## 2023-01-06 NOTE — Assessment & Plan Note (Signed)
New problem Likely due to adjustment in new schedule Recommend she revisit her work schedule and decrease length of hours working daily from 16 hrs to 12 hrs max.  May need to increase shifts and decrease length of consecutive days off Could be side effect of Wellbutrin.  Recommend she switch times taking medication to before work rather than in am prior to sleep. Provided sleep hygiene education If no improvement consider sleep studies Can consider OTC Melatonin at night

## 2023-01-31 NOTE — Progress Notes (Signed)
PCP:  Dana Allan, MD   Chief Complaint  Patient presents with   Gynecologic Exam    Hot flashes and insomnia x cople of months     HPI:      [name] is a 37 y.o. [id number] whose LMP was Patient's last menstrual period was 01/14/2023 (approximate)., presents today for her annual examination.  Her menses are regular every 28-30 days, lasting 7+ days usually, mod to heavy flow, with clots, occas BTB, mild to mod dysmen, improved with midol. Menses have varied in length of flow past few months. Also with vasomotor sx. Has been under some increased stress. Thinks mom went through menopause earlier.     Sex activity: single partner, contraception - none. Conception ok.  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   Last mammogram: N/A due to age There is no FH of breast cancer. There is no FH of ovarian cancer. The patient does do self-breast exams.  Tobacco use: The patient denies current or previous tobacco use. Alcohol use: none No drug use.  Exercise: moderately active  She does get adequate calcium but not Vitamin D in her diet.  Patient Active Problem List   Diagnosis Date Noted   Sleep disorder 01/06/2023   Migraines 01/07/2022   Chronic interstitial cystitis 10/25/2020   Dysmenorrhea 10/25/2020   Esophageal reflux 10/25/2020   Leiomyoma of uterus 10/25/2020   Tobacco use disorder 10/25/2020   BMI 35.0-35.9,adult 04/05/2017   Bilateral lower extremity edema 09/11/2016   Depression, recurrent (HCC) 12/05/2015   Class 2 obesity 02/25/2015    Past Surgical History:  Procedure Laterality Date   CESAREAN SECTION N/A 11/14/2017   Procedure: CESAREAN SECTION;  Surgeon: Natale Milch, MD;  Location: [facility] ORS;  Service: Obstetrics;  Laterality: N/A;   TONSILLECTOMY AND ADENOIDECTOMY  2013    Family History  Problem Relation Age of Onset   Hyperlipidemia Mother    Hypertension Mother    Diabetes Mother      Social History   Socioeconomic History   Marital status: Single    Spouse name: Not on file   Number of children: Not on file   Years of education: Not on file   Highest education level: Bachelor's degree (e.g., BA, AB, BS)  Occupational History   Not on file  Tobacco Use   Smoking status: Former   Smokeless tobacco: Never  Vaping Use   Vaping status: Never Used  Substance and Sexual Activity   Alcohol use: Not Currently    Alcohol/week: 0.0 standard drinks of alcohol    Comment: occasioanlly   Drug use: No   Sexual activity: Yes    Birth control/protection: None  Other Topics Concern   Not on file  Social History Narrative   Not on file   Social Determinants of Health   Financial Resource Strain: Low Risk  (12/13/2022)   Overall Financial Resource Strain (CARDIA)    Difficulty of Paying Living Expenses: Not very hard  Food Insecurity: No Food Insecurity (12/13/2022)   Hunger Vital Sign    Worried About Running Out of Food in the Last Year: Never true    Ran Out of Food in the Last Year: Never true  Transportation Needs: No Transportation Needs (12/13/2022)   PRAPARE - Administrator, Civil Service (Medical): No    Lack of Transportation (Non-Medical): No  Physical Activity: Sufficiently Active (12/13/2022)   Exercise Vital Sign  Days of Exercise per Week: 3 days    Minutes of Exercise per Session: 150+ min  Stress: Stress Concern Present (12/13/2022)   [employer] of Occupational Health - Occupational Stress Questionnaire    Feeling of Stress : Very much  Social Connections: Unknown (12/13/2022)   Social Connection and Isolation Panel [NHANES]    Frequency of Communication with Friends and Family: More than three times a week    Frequency of Social Gatherings with Friends and Family: Once a week    Attends Religious Services: Patient declined    Database administrator or Organizations: No    Attends Engineer, structural: Not on file     Marital Status: Never married  Intimate Partner Violence: Not on file     Current Outpatient Medications:    buPROPion (WELLBUTRIN XL) 300 MG 24 hr tablet, Take 1 tablet (300 mg total) by mouth daily., Disp: 90 tablet, Rfl: 3   Multiple Vitamin (MULTIVITAMIN) capsule, Take 1 capsule by mouth daily., Disp: , Rfl:    SUMAtriptan (IMITREX) 25 MG tablet, Take 1 tablet (25 mg total) by mouth every 2 (two) hours as needed for migraine. May repeat in 2 hours if headache persists or recurs., Disp: 30 tablet, Rfl: 1     ROS:  Review of Systems  Constitutional:  Positive for fatigue. Negative for fever and unexpected weight change.  Respiratory:  Negative for cough, shortness of breath and wheezing.   Cardiovascular:  Negative for chest pain, palpitations and leg swelling.  Gastrointestinal:  Negative for blood in stool, constipation, diarrhea, nausea and vomiting.  Endocrine: Negative for cold intolerance, heat intolerance and polyuria.  Genitourinary:  Negative for dyspareunia, dysuria, flank pain, frequency, genital sores, hematuria, menstrual problem, pelvic pain, urgency, vaginal bleeding, vaginal discharge and vaginal pain.  Musculoskeletal:  Negative for back pain, joint swelling and myalgias.  Skin:  Negative for rash.  Neurological:  Negative for dizziness, syncope, light-headedness, numbness and headaches.  Hematological:  Negative for adenopathy.  Psychiatric/Behavioral:  Positive for agitation and dysphoric mood. Negative for confusion, sleep disturbance and suicidal ideas. The patient is not nervous/anxious.    BREAST: No symptoms   Objective: BP 120/72   Ht 5\' 5"  (1.651 m)   Wt 226 lb (102.5 kg)   LMP 01/14/2023 (Approximate)   BMI 37.61 kg/m    Physical Exam Constitutional:      Appearance: She is well-developed.  Genitourinary:     Vulva normal.     Right Labia: No rash, tenderness or lesions.    Left Labia: No tenderness, lesions or rash.    No vaginal  discharge, erythema or tenderness.      Right Adnexa: not tender and no mass present.    Left Adnexa: not tender and no mass present.    No cervical friability or polyp.     Uterus is not enlarged or tender.  Breasts:    Right: No mass, nipple discharge, skin change or tenderness.     Left: No mass, nipple discharge, skin change or tenderness.  Neck:     Thyroid: No thyromegaly.  Cardiovascular:     Rate and Rhythm: Normal rate and regular rhythm.     Heart sounds: Normal heart sounds. No murmur heard. Pulmonary:     Effort: Pulmonary effort is normal.     Breath sounds: Normal breath sounds.  Abdominal:     Palpations: Abdomen is soft.     Tenderness: There is no abdominal tenderness. There is no  guarding or rebound.  Musculoskeletal:        General: Normal range of motion.     Cervical back: Normal range of motion.  Lymphadenopathy:     Cervical: No cervical adenopathy.  Neurological:     General: No focal deficit present.     Mental Status: She is alert and oriented to person, place, and time.     Cranial Nerves: No cranial nerve deficit.  Skin:    General: Skin is warm and dry.  Psychiatric:        Mood and Affect: Mood normal.        Behavior: Behavior normal.        Thought Content: Thought content normal.        Judgment: Judgment normal.  Vitals reviewed.     Assessment/Plan: Encounter for annual routine gynecological examination  Perimenopausal vasomotor symptoms--for a few months. Question stress vs hormones. Reassurance. F/u prn.             GYN counsel adequate intake of calcium and vitamin D, diet and exercise     F/U  Return in about 1 year (around 02/01/2024).  Briceyda Abdullah B. Ladon Heney, PA-C 02/01/2023 12:31 PM

## 2023-02-01 ENCOUNTER — Ambulatory Visit (INDEPENDENT_AMBULATORY_CARE_PROVIDER_SITE_OTHER): Payer: BC Managed Care – PPO | Admitting: Obstetrics and Gynecology

## 2023-02-01 ENCOUNTER — Encounter: Payer: Self-pay | Admitting: Obstetrics and Gynecology

## 2023-02-01 VITALS — BP 120/72 | Ht 65.0 in | Wt 226.0 lb

## 2023-02-01 DIAGNOSIS — N870 Mild cervical dysplasia: Secondary | ICD-10-CM

## 2023-02-01 DIAGNOSIS — Z01419 Encounter for gynecological examination (general) (routine) without abnormal findings: Secondary | ICD-10-CM

## 2023-02-01 DIAGNOSIS — N951 Menopausal and female climacteric states: Secondary | ICD-10-CM

## 2023-02-01 NOTE — Patient Instructions (Signed)
I value your feedback and you entrusting us with your care. If you get a [facility] patient survey, I would appreciate you taking the time to let us know about your experience today. Thank you! ? ? ?

## 2023-02-28 ENCOUNTER — Ambulatory Visit
Admission: RE | Admit: 2023-02-28 | Discharge: 2023-02-28 | Disposition: A | Discharge status: Home / Self Care | Payer: BC Managed Care – PPO | Source: Ambulatory Visit | Attending: Emergency Medicine | Admitting: Emergency Medicine

## 2023-02-28 VITALS — BP 119/89 | HR 97 | Temp 98.2°F | Resp 18 | Wt 210.0 lb

## 2023-02-28 DIAGNOSIS — B349 Viral infection, unspecified: Secondary | ICD-10-CM

## 2023-02-28 LAB — GROUP A STREP BY PCR: Group A Strep by PCR: NOT DETECTED

## 2023-02-28 NOTE — Discharge Instructions (Signed)
Your strep test is negative. Most likely you have a viral illness: no antibiotic as indicated at this time, May treat with OTC meds of choice. Make sure to drink plenty of fluids to stay hydrated(gatorade, water, popsicles,jello,etc), avoid caffeine products. Follow up with PCP. Return as needed.

## 2023-02-28 NOTE — ED Triage Notes (Signed)
Sore throat, ear pain, runny nose, fatigue. All symptoms have been going on for 4 days. No over the counter meds are working   Cough started last night. Chest and throat hurt when coughing. Took covid test last night at work and was negative.

## 2023-02-28 NOTE — ED Provider Notes (Signed)
[facility]    CSN: [id number] Arrival date & time: 02/28/23  8295      History   Chief Complaint Chief Complaint  Patient presents with   Sore Throat    Sore throat, ear pain, runny nose, fatigue. All symptoms have been going on for 4 days. No over the counter meds are working - Entered by patient    HPI Laura Wiggins is a 37 y.o. female.   37 year old female pt, Laura Wiggins, presents to urgent care for evaluation of sore throat, ear pain, runny nose,fatigue x 4 days, took Covid test last night negative. Treating symptoms with over the counter meds, no relief.   The history is provided by the patient. No language interpreter was used.    Past Medical History:  Diagnosis Date   Bacterial vaginosis 10/25/2020   Cancer (HCC)    Melanoma   Chest pain 10/22/2016   Chicken pox    Class 2 severe obesity due to excess calories with serious comorbidity and body mass index (BMI) of 37.0 to 37.9 in adult [facility]) 07/06/2022   COVID-19 07/29/2019   diagnosed 07/29/2019 according to the patient   Cystitis 10/25/2020   Depression    Diffuse cystic mastopathy 10/25/2020   Dizziness and giddiness 10/25/2020   Probale related to numerous medications, will have patient increae H2O intake to ensure that she is adequately hydrated.   Endometriosis 2004   Female pelvic pain 10/25/2020   Female stress incontinence 10/25/2020   Frequent headaches    GBS bacteriuria 04/05/2017   [ ]  treated with amoxicillin in early pregnancy  [ ]  tx for GBS in labor   History of preterm delivery, currently pregnant 04/05/2017   [x]  17 [facility]. 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   Postcoital bleeding  10/25/2020   Postpartum care following cesarean delivery 11/16/2017   PROM (premature rupture of membranes) 11/13/2017   Supervision of high risk pregnancy, antepartum 04/05/2017   Clinic  [facility]  Prenatal Labs  Dating  L=6  Blood type: A/Positive/-- (11/02 1034)   Genetic Screen  NIPS: diploid nml XX  Antibody:Negative (11/02 1034)  Anatomic [place]  Incomplete @ 18wks,  [ ] f/u ord'd 1/25  Rubella: <0.90 (11/02 1034)   Varicella: Immune  GTT  Early:147, 3h all nml        Third trimester: [] needs 3hr  RPR: Non Reactive (11/02 1034)   Rhogam  n/a  HBsAg: Negative (11/02 1034)      Urge incontinence of urine 10/25/2020   Urgency of urination 10/25/2020   Urinary incontinence     Patient Active Problem List   Diagnosis Date Noted   Nonspecific syndrome suggestive of viral illness 02/28/2023   Sleep disorder 01/06/2023   Migraines 01/07/2022   Chronic interstitial cystitis 10/25/2020   Dysmenorrhea 10/25/2020   Esophageal reflux 10/25/2020   Leiomyoma of uterus 10/25/2020   Tobacco use disorder 10/25/2020   BMI 35.0-35.9,adult 04/05/2017   Bilateral lower extremity edema 09/11/2016   Depression, recurrent (HCC) 12/05/2015   Class 2 obesity 02/25/2015    Past Surgical History:  Procedure Laterality Date   CESAREAN SECTION N/A 11/14/2017   Procedure: CESAREAN  SECTION;  Surgeon: Natale Milch, MD;  Location: [facility] ORS;  Service: Obstetrics;  Laterality: N/A;   TONSILLECTOMY AND ADENOIDECTOMY  2013    OB History     Gravida  3   Para  2   Term  2   Preterm      AB  1   Living  2      SAB      IAB      Ectopic      Multiple  0   Live Births  2            Home Medications    Prior to Admission medications   Medication Sig Start Date End Date Taking? Authorizing Provider  buPROPion (WELLBUTRIN XL) 300 MG 24 hr tablet Take 1 tablet (300 mg total) by mouth daily. 07/04/22  Yes Dana Allan, MD  Multiple Vitamin (MULTIVITAMIN) capsule Take 1 capsule by mouth  daily.   Yes [provider]  SUMAtriptan (IMITREX) 25 MG tablet Take 1 tablet (25 mg total) by mouth every 2 (two) hours as needed for migraine. May repeat in 2 hours if headache persists or recurs. 12/25/22  Yes Dana Allan, MD    Family History Family History  Problem Relation Age of Onset   Hyperlipidemia Mother    Hypertension Mother    Diabetes Mother     Social History Social History   Tobacco Use   Smoking status: Former   Smokeless tobacco: Never  Advertising account planner   Vaping status: Never Used  Substance Use Topics   Alcohol use: Not Currently    Alcohol/week: 0.0 standard drinks of alcohol    Comment: occasioanlly   Drug use: No     Allergies   Patient has no known allergies.   Review of Systems Review of Systems  Constitutional:  Positive for fatigue.  HENT:  Positive for ear pain, rhinorrhea and sore throat.   Respiratory:  Positive for cough.   All other systems reviewed and are negative.    Physical Exam Triage Vital Signs ED Triage Vitals  Encounter Vitals Group     BP 02/28/23 0833 119/89     Systolic BP Percentile --      Diastolic BP Percentile --      Pulse Rate 02/28/23 0833 97     Resp 02/28/23 0833 18     Temp 02/28/23 0833 98.2 F (36.8 C)     Temp Source 02/28/23 0833 Oral     SpO2 02/28/23 0833 100 %     Weight 02/28/23 0831 210 lb (95.3 kg)     Height --      Head Circumference --      Peak Flow --      Pain Score 02/28/23 0831 10     Pain Loc --      Pain Education --      Exclude from Growth Chart --    No data found.  Updated Vital Signs BP 119/89 (BP Location: Left Arm)   Pulse 97   Temp 98.2 F (36.8 C) (Oral)   Resp 18   Wt 210 lb (95.3 kg)   LMP 02/14/2023 (Approximate)   SpO2 100%   BMI 34.95 kg/m   Visual Acuity Right Eye Distance:   Left Eye Distance:   Bilateral Distance:    Right Eye Near:   Left Eye Near:    Bilateral Near:     Physical Exam Vitals and nursing note reviewed.   Constitutional:  Appearance: She is well-developed and well-groomed.  HENT:     Head: Normocephalic.     Right Ear: Tympanic membrane is retracted.     Left Ear: Tympanic membrane is retracted.     Nose: Mucosal edema and congestion present.     Right Sinus: No maxillary sinus tenderness or frontal sinus tenderness.     Left Sinus: Frontal sinus tenderness present. No maxillary sinus tenderness.     Mouth/Throat:     Lips: Pink.     Mouth: Mucous membranes are moist.     Pharynx: Uvula midline. Posterior oropharyngeal erythema present.  Cardiovascular:     Rate and Rhythm: Normal rate and regular rhythm.     Pulses: Normal pulses.     Heart sounds: Normal heart sounds.  Pulmonary:     Effort: Pulmonary effort is normal.     Breath sounds: Normal breath sounds and air entry.  Neurological:     General: No focal deficit present.     Mental Status: She is alert and oriented to person, place, and time.     GCS: GCS eye subscore is 4. GCS verbal subscore is 5. GCS motor subscore is 6.  Psychiatric:        Attention and Perception: Attention normal.        Mood and Affect: Mood normal.        Speech: Speech normal.        Behavior: Behavior normal. Behavior is cooperative.      UC Treatments / Results  Labs (all labs ordered are listed, but only abnormal results are displayed) Labs Reviewed  GROUP A STREP BY PCR    EKG   Radiology No results found.  Procedures Procedures (including critical care time)  Medications Ordered in UC Medications - No data to display  Initial Impression / Assessment and Plan / UC Course  I have reviewed the triage vital signs and the nursing notes.  Pertinent labs & imaging results that were available during my care of the patient were reviewed by me and considered in my medical decision making (see chart for details).     Ddx: Viral illness, allergies Final Clinical Impressions(s) / UC Diagnoses   Final diagnoses:  Nonspecific  syndrome suggestive of viral illness     Discharge Instructions      Your strep test is negative. Most likely you have a viral illness: no antibiotic as indicated at this time, May treat with OTC meds of choice. Make sure to drink plenty of fluids to stay hydrated(gatorade, water, popsicles,jello,etc), avoid caffeine products. Follow up with PCP. Return as needed.     ED Prescriptions   None    PDMP not reviewed this encounter.   Clancy Gourd, NP 02/28/23 1205

## 2023-05-20 ENCOUNTER — Ambulatory Visit: Payer: Self-pay | Admitting: Internal Medicine

## 2023-06-07 ENCOUNTER — Ambulatory Visit: Payer: BC Managed Care – PPO | Admitting: Nurse Practitioner

## 2023-07-01 ENCOUNTER — Ambulatory Visit: Payer: BC Managed Care – PPO | Admitting: Internal Medicine

## 2023-07-01 ENCOUNTER — Telehealth: Payer: Self-pay

## 2023-07-01 ENCOUNTER — Encounter: Payer: Self-pay | Admitting: Internal Medicine

## 2023-07-01 VITALS — BP 124/78 | HR 91 | Temp 97.3°F | Ht 65.0 in | Wt 227.0 lb

## 2023-07-01 DIAGNOSIS — J0110 Acute frontal sinusitis, unspecified: Secondary | ICD-10-CM

## 2023-07-01 DIAGNOSIS — J329 Chronic sinusitis, unspecified: Secondary | ICD-10-CM | POA: Insufficient documentation

## 2023-07-01 DIAGNOSIS — R42 Dizziness and giddiness: Secondary | ICD-10-CM | POA: Insufficient documentation

## 2023-07-01 MED ORDER — AMOXICILLIN-POT CLAVULANATE 875-125 MG PO TABS
1.0000 | ORAL_TABLET | Freq: Two times a day (BID) | ORAL | 0 refills | Status: DC
Start: 2023-07-01 — End: 2023-07-30

## 2023-07-01 NOTE — Patient Instructions (Signed)
-  It was a pleasure meeting you today -I do believe that you have an acute sinusitis and I am concerned for possible bacterial infection -I will prescribe Augmentin to complete a 5-day course for you -Please try conservative measures to help with your congestion including a Nettie pot, saline nasal spray, Flonase as well as warm showers with stimulation -Please contact us if you have any questions or concerns if her symptoms do not improve

## 2023-07-01 NOTE — Assessment & Plan Note (Signed)
-  Patient states that for the last 2 weeks she has developed sinus congestion, dry cough, rhinorrhea, stuffy nose.  No fevers or chills.  She states that her daughter was sick with pneumonia and that her fianc had strep throat.  She denies any shortness of breath or sore throat -Patient did have tenderness over her frontal sinuses on exam.  Lungs are clear to auscultation.  Nasal congestion noted as well as diminished light reflex of the right TM. -I suspect he likely has a superimposed bacterial infection given her acute worsening of symptoms and persistent symptoms over the last 2 weeks -Will prescribe a course of Augmentin to complete a 5-day course -I also advised conservative measures like Flonase, steam inhalation, Nettie pot and saline nasal spray -Return precautions were given to the patient

## 2023-07-01 NOTE — Telephone Encounter (Signed)
Patient was in office and states she has moved and would like the Wellbutrin sent to [facility] on [address]. I did change the Pharmacy.

## 2023-07-01 NOTE — Progress Notes (Signed)
Acute Office Visit  Subjective:     Patient ID: Laura Wiggins, female    DOB: 01/28/1986, 38 y.o.   MRN: [id number]  Chief Complaint  Patient presents with   Acute Visit    Headache Bilateral ear pressure & popping Throat dry     HPI Patient is in today for acute sinus infection.  Patient states that she developed sinus congestion in both frontal and maxillary sinuses as well as rhinorrhea, stuffy nose and a dry cough.  She denies any fevers or chills.  Patient states symptoms have been present for the past 2 weeks and have worsened acutely since Thursday.  Patient states that when she coughs she develops pain over the back of her head.  She also complains of her ears constantly popping.  No shortness of breath.   Patient states that she had similar symptoms back in September that took a week to get better but the symptoms have been persistent.    Review of Systems  Constitutional: Negative.  Negative for chills and fever.  HENT:  Positive for congestion, ear pain and sinus pain. Negative for ear discharge.   Respiratory:  Positive for cough. Negative for sputum production, shortness of breath and wheezing.   Cardiovascular: Negative.   Skin: Negative.   Neurological:  Positive for headaches.        Objective:    BP 124/78   Pulse 91   Temp (!) 97.3 F (36.3 C)   Ht 5\' 5"  (1.651 m)   Wt 227 lb (103 kg)   SpO2 99%   BMI 37.77 kg/m    Physical Exam Constitutional:      Appearance: Normal appearance.  HENT:     Head: Normocephalic and atraumatic.     Right Ear: Ear canal and external ear normal.     Left Ear: Tympanic membrane, ear canal and external ear normal.     Ears:     Comments: Patient noted to have diminished light reflex in right TM but no bulging or erythema noted    Nose: Congestion present.     Mouth/Throat:     Mouth: Mucous membranes are dry.     Pharynx: Oropharynx is clear. No oropharyngeal exudate or posterior oropharyngeal erythema.   Cardiovascular:     Rate and Rhythm: Normal rate and regular rhythm.     Heart sounds: Normal heart sounds.  Pulmonary:     Effort: Pulmonary effort is normal. No respiratory distress.     Breath sounds: Normal breath sounds. No wheezing or rales.  Musculoskeletal:     Cervical back: Neck supple.  Lymphadenopathy:     Cervical: No cervical adenopathy.  Neurological:     Mental Status: She is alert.  Psychiatric:        Mood and Affect: Mood normal.        Behavior: Behavior normal.     No results found for any visits on 07/01/23.      Assessment & Plan:   Problem List Items Addressed This Visit       Respiratory   Sinusitis - Primary   -Patient states that for the last 2 weeks she has developed sinus congestion, dry cough, rhinorrhea, stuffy nose.  No fevers or chills.  She states that her daughter was sick with pneumonia and that her fianc had strep throat.  She denies any shortness of breath or sore throat -Patient did have tenderness over her frontal sinuses on exam.  Lungs are clear to auscultation.  Nasal congestion noted as well as diminished light reflex of the right TM. -I suspect he likely has a superimposed bacterial infection given her acute worsening of symptoms and persistent symptoms over the last 2 weeks -Will prescribe a course of Augmentin to complete a 5-day course -I also advised conservative measures like Flonase, steam inhalation, Nettie pot and saline nasal spray -Return precautions were given to the patient      Relevant Medications   amoxicillin-clavulanate (AUGMENTIN) 875-125 MG tablet    Meds ordered this encounter  Medications   amoxicillin-clavulanate (AUGMENTIN) 875-125 MG tablet    Sig: Take 1 tablet by mouth 2 (two) times daily.    Dispense:  10 tablet    Refill:  0    No follow-ups on file.  Earl Lagos, MD

## 2023-07-02 ENCOUNTER — Other Ambulatory Visit: Payer: Self-pay | Admitting: Family

## 2023-07-02 DIAGNOSIS — F339 Major depressive disorder, recurrent, unspecified: Secondary | ICD-10-CM

## 2023-07-02 MED ORDER — BUPROPION HCL ER (XL) 300 MG PO TB24
300.0000 mg | ORAL_TABLET | Freq: Every day | ORAL | 3 refills | Status: DC
Start: 2023-07-02 — End: 2023-07-30

## 2023-07-03 NOTE — Telephone Encounter (Signed)
Patient is aware that the Wellbutrin was sent to the Pharmacy.

## 2023-07-30 ENCOUNTER — Encounter: Payer: Self-pay | Admitting: Internal Medicine

## 2023-07-30 ENCOUNTER — Ambulatory Visit: Payer: BC Managed Care – PPO | Admitting: Internal Medicine

## 2023-07-30 ENCOUNTER — Other Ambulatory Visit: Payer: Self-pay

## 2023-07-30 VITALS — BP 124/72 | HR 96 | Temp 97.9°F | Resp 16 | Ht 65.0 in | Wt 230.2 lb

## 2023-07-30 DIAGNOSIS — N3010 Interstitial cystitis (chronic) without hematuria: Secondary | ICD-10-CM | POA: Diagnosis not present

## 2023-07-30 DIAGNOSIS — F339 Major depressive disorder, recurrent, unspecified: Secondary | ICD-10-CM

## 2023-07-30 DIAGNOSIS — G43009 Migraine without aura, not intractable, without status migrainosus: Secondary | ICD-10-CM

## 2023-07-30 DIAGNOSIS — N809 Endometriosis, unspecified: Secondary | ICD-10-CM

## 2023-07-30 DIAGNOSIS — Z13220 Encounter for screening for lipoid disorders: Secondary | ICD-10-CM

## 2023-07-30 DIAGNOSIS — Z1231 Encounter for screening mammogram for malignant neoplasm of breast: Secondary | ICD-10-CM

## 2023-07-30 DIAGNOSIS — Z6838 Body mass index (BMI) 38.0-38.9, adult: Secondary | ICD-10-CM

## 2023-07-30 MED ORDER — BUPROPION HCL ER (XL) 300 MG PO TB24: 300.0000 mg | ORAL_TABLET | Freq: Every day | ORAL | 3 refills | Status: AC

## 2023-07-30 MED ORDER — SUMATRIPTAN SUCCINATE 25 MG PO TABS: 25.0000 mg | ORAL_TABLET | ORAL | 1 refills | Status: DC | PRN

## 2023-07-30 NOTE — Assessment & Plan Note (Signed)
 Mood stable, currently on Wellbutrin 300 mg XL, will refill today.

## 2023-07-30 NOTE — Assessment & Plan Note (Signed)
 Had been following with urogynecology but no longer, is not on any medications.

## 2023-07-30 NOTE — Progress Notes (Signed)
 New Patient Office Visit  Subjective    Patient ID: Laura Wiggins, female    DOB: 1985-06-11  Age: 38 y.o. MRN: [id number]  CC:  Chief Complaint  Patient presents with   Establish Care    HPI Laura Wiggins presents to establish care.  Hx of Melanoma:  -hx of melanoma removed at 38 years old -Has Dermatology appointment next month   MIGRAINES Duration: chronic Episodes occurring 2-4 times a month, taking Imitrex does help Onset: sudden Severity: severe Quality: sharp and stabbing Frequency: intermittent Treatments attempted: triptans currently on Imitrex 25 mg as needed  MDD: -Mood status: stable -Current treatment: Wellbutrin XL 300 mg -Satisfied with current treatment?: yes -Duration of current treatment : chronic -Side effects: no Medication compliance: excellent compliance     07/30/2023   10:00 AM 07/01/2023    9:58 AM 12/25/2022    8:58 AM 07/04/2022    1:46 PM 04/02/2022   10:28 AM  Depression screen PHQ 2/9  Decreased Interest 0 0 0 0 0  Down, Depressed, Hopeless 0 1 1 1  0  PHQ - 2 Score 0 1 1 1  0  Altered sleeping  2  1 2   Tired, decreased energy  1  0 1  Change in appetite  0  0 0  Feeling bad or failure about yourself   0  1 0  Trouble concentrating  0  0 0  Moving slowly or fidgety/restless  0  0 0  Suicidal thoughts  0  0 0  PHQ-9 Score  4  3 3   Difficult doing work/chores  Not difficult at all  Not difficult at all Not difficult at all   Endometriosis/IC: -Following with Gynecologist  -Was first diagnosed with these chronic issues when she was in the [employer] after she underwent an exploratory surgery -Currently trying to conceive, not interested in normal birth control.  Recurrent Kidney Stones:  -Usually will have a kidney stone about once a year, last time he had one was last year  Health Maintenance: -Blood work due -Pap 2023 negative -Will order mammogram for the summer  Outpatient Encounter Medications as of 07/30/2023   Medication Sig   Multiple Vitamin (MULTIVITAMIN) capsule Take 1 capsule by mouth daily.   [DISCONTINUED] buPROPion (WELLBUTRIN XL) 300 MG 24 hr tablet Take 1 tablet (300 mg total) by mouth daily.   [DISCONTINUED] SUMAtriptan (IMITREX) 25 MG tablet Take 1 tablet (25 mg total) by mouth every 2 (two) hours as needed for migraine. May repeat in 2 hours if headache persists or recurs.   buPROPion (WELLBUTRIN XL) 300 MG 24 hr tablet Take 1 tablet (300 mg total) by mouth daily.   SUMAtriptan (IMITREX) 25 MG tablet Take 1 tablet (25 mg total) by mouth every 2 (two) hours as needed for migraine. May repeat in 2 hours if headache persists or recurs.   [DISCONTINUED] amoxicillin-clavulanate (AUGMENTIN) 875-125 MG tablet Take 1 tablet by mouth 2 (two) times daily. (Patient not taking: Reported on 07/30/2023)   No facility-administered encounter medications on file as of 07/30/2023.    Past Medical History:  Diagnosis Date   Bacterial vaginosis 10/25/2020   Cancer (HCC)    Melanoma   Chest pain 10/22/2016   Chicken pox    Class 2 severe obesity due to excess calories with serious comorbidity and body mass index (BMI) of 37.0 to 37.9 in adult [facility]) 07/06/2022   COVID-19 07/29/2019   diagnosed 07/29/2019 according to the patient   Cystitis 10/25/2020  Depression    Diffuse cystic mastopathy 10/25/2020   Dizziness and giddiness 10/25/2020   Probale related to numerous medications, will have patient increae H2O intake to ensure that she is adequately hydrated.   Endometriosis 2004   Endometriosis    Female pelvic pain 10/25/2020   Female stress incontinence 10/25/2020   Frequent headaches    GBS bacteriuria 04/05/2017   [ ]  treated with amoxicillin in early pregnancy  [ ]  tx for GBS in labor   History of preterm delivery, currently pregnant 04/05/2017   [x]  17 [facility]. 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   Postcoital bleeding 10/25/2020   Postpartum care following cesarean delivery 11/16/2017   PROM (premature rupture of membranes) 11/13/2017   Supervision of high risk pregnancy, antepartum 04/05/2017   Clinic  [facility]  Prenatal Labs  Dating  L=6  Blood type: A/Positive/-- (11/02 1034)   Genetic Screen  NIPS: diploid nml XX  Antibody:Negative (11/02 1034)  Anatomic [place]  Incomplete @ 18wks,  [ ] f/u ord'd 1/25  Rubella: <0.90 (11/02 1034)   Varicella: Immune  GTT  Early:147, 3h all nml        Third trimester: [] needs 3hr  RPR: Non Reactive (11/02 1034)   Rhogam  n/a  HBsAg: Negative (11/02 1034)      Urge incontinence of urine 10/25/2020   Urgency of urination 10/25/2020   Urinary incontinence     Past Surgical History:  Procedure Laterality Date   CESAREAN SECTION N/A 11/14/2017   Procedure: CESAREAN SECTION;  Surgeon: Natale Milch, MD;  Location: [facility] ORS;  Service: Obstetrics;  Laterality: N/A;   TONSILLECTOMY AND ADENOIDECTOMY  2013    Family History  Problem Relation Age of Onset   Hyperlipidemia Mother    Hypertension Mother    Diabetes Mother     Social History   Socioeconomic History   Marital status: Single    Spouse name: Not on file   Number of children: 2   Years of education: Not on file   Highest education level: Bachelor's degree (e.g., BA, AB, BS)  Occupational History   Not on file  Tobacco Use   Smoking status: Former    Passive exposure: Never   Smokeless tobacco: Never  Vaping Use   Vaping status: Never Used  Substance and Sexual Activity   Alcohol use: Not Currently    Alcohol/week: 0.0 standard drinks of alcohol    Comment: occasioanlly   Drug use: No   Sexual activity: Yes    Birth control/protection: None  Other Topics Concern   Not on file   Social History Narrative   Not on file   Social Drivers of Health   Financial Resource Strain: Low Risk  (12/13/2022)   Overall Financial Resource Strain (CARDIA)    Difficulty of Paying Living Expenses: Not very hard  Food Insecurity: No Food Insecurity (12/13/2022)   Hunger Vital Sign    Worried About Running Out of Food in the Last Year: Never true    Ran Out of Food in the Last Year: Never true  Transportation Needs: No Transportation Needs (12/13/2022)   PRAPARE - Transportation    Lack of Transportation (  Medical): No    Lack of Transportation (Non-Medical): No  Physical Activity: Sufficiently Active (12/13/2022)   Exercise Vital Sign    Days of Exercise per Week: 3 days    Minutes of Exercise per Session: 150+ min  Stress: Stress Concern Present (12/13/2022)   [employer] of Occupational Health - Occupational Stress Questionnaire    Feeling of Stress : Very much  Social Connections: Unknown (12/13/2022)   Social Connection and Isolation Panel [NHANES]    Frequency of Communication with Friends and Family: More than three times a week    Frequency of Social Gatherings with Friends and Family: Once a week    Attends Religious Services: Patient declined    Database administrator or Organizations: No    Attends Engineer, structural: Not on file    Marital Status: Never married  Intimate Partner Violence: Not on file    Review of Systems  All other systems reviewed and are negative.       Objective    BP 124/72 (Cuff Size: Large)   Pulse 96   Temp 97.9 F (36.6 C) (Oral)   Resp 16   Ht 5\' 5"  (1.651 m)   Wt 230 lb 3.2 oz (104.4 kg)   LMP 07/10/2023   SpO2 95%   BMI 38.31 kg/m   Physical Exam Constitutional:      Appearance: Normal appearance.  HENT:     Head: Normocephalic and atraumatic.     Mouth/Throat:     Mouth: Mucous membranes are moist.     Pharynx: Oropharynx is clear.  Eyes:     Extraocular Movements: Extraocular movements intact.      Conjunctiva/sclera: Conjunctivae normal.     Pupils: Pupils are equal, round, and reactive to light.  Neck:     Comments: No thyromegaly Cardiovascular:     Rate and Rhythm: Normal rate and regular rhythm.  Pulmonary:     Effort: Pulmonary effort is normal.     Breath sounds: Normal breath sounds.  Musculoskeletal:     Cervical back: No tenderness.     Right lower leg: No edema.     Left lower leg: No edema.  Lymphadenopathy:     Cervical: No cervical adenopathy.  Skin:    General: Skin is warm and dry.  Neurological:     General: No focal deficit present.     Mental Status: She is alert. Mental status is at baseline.  Psychiatric:        Mood and Affect: Mood normal.        Behavior: Behavior normal.         Assessment & Plan:  Depression, recurrent (HCC) Assessment & Plan: Mood stable, currently on Wellbutrin 300 mg XL, will refill today.  Orders: -     CBC with Differential/Platelet -     COMPLETE METABOLIC PANEL WITH GFR -     buPROPion HCl ER (XL); Take 1 tablet (300 mg total) by mouth daily.  Dispense: 90 tablet; Refill: 3  Migraine without aura and without status migrainosus, not intractable Assessment & Plan: Overall migraines are less frequent, occurring about 2-4 times a month responds well to Imitrex.  Will refill Imitrex 25 mg as needed.  Orders: -     CBC with Differential/Platelet -     COMPLETE METABOLIC PANEL WITH GFR -     SUMAtriptan Succinate; Take 1 tablet (25 mg total) by mouth every 2 (two) hours as needed for migraine. May repeat in 2  hours if headache persists or recurs.  Dispense: 30 tablet; Refill: 1  Endometriosis Assessment & Plan: Following with gynecology.   Chronic interstitial cystitis Assessment & Plan: Had been following with urogynecology but no longer, is not on any medications.   Lipid screening -     Lipid panel  Encounter for screening mammogram for malignant neoplasm of breast -     3D Screening Mammogram, Left  and Right; Future  Obesity, morbid (HCC) Assessment & Plan: We did discuss how she easily gain and then lose weight.  She did have a hormonal workup last year that was only negative.  We did discuss starting medications with GLP-1 therapy, however her insurance will not cover.  Will continue to work on calorie restriction and lifestyle management and we will continue to monitor.   Cholesterol was elevated specifically her LDL on labs from last year.  Will recheck fasting labs today but discussed potentially starting statin therapy in the next 3 to 5 years as she is currently trying to conceive.  Return in about 1 year (around 07/29/2024).   Margarita Mail, DO

## 2023-07-30 NOTE — Assessment & Plan Note (Signed)
Following with gynecology

## 2023-07-30 NOTE — Assessment & Plan Note (Signed)
 We did discuss how she easily gain and then lose weight.  She did have a hormonal workup last year that was only negative.  We did discuss starting medications with GLP-1 therapy, however her insurance will not cover.  Will continue to work on calorie restriction and lifestyle management and we will continue to monitor.

## 2023-07-30 NOTE — Assessment & Plan Note (Signed)
 Overall migraines are less frequent, occurring about 2-4 times a month responds well to Imitrex.  Will refill Imitrex 25 mg as needed.

## 2023-07-31 LAB — CBC WITH DIFFERENTIAL/PLATELET
Absolute Lymphocytes: 1090 {cells}/uL (ref 850–3900)
Absolute Monocytes: 466 {cells}/uL (ref 200–950)
Basophils Absolute: 29 {cells}/uL (ref 0–200)
Basophils Relative: 0.6 %
Eosinophils Absolute: 139 {cells}/uL (ref 15–500)
Eosinophils Relative: 2.9 %
HCT: 36.4 % (ref 35.0–45.0)
Hemoglobin: 11.8 g/dL (ref 11.7–15.5)
MCH: 29.4 pg (ref 27.0–33.0)
MCHC: 32.4 g/dL (ref 32.0–36.0)
MCV: 90.5 fL (ref 80.0–100.0)
MPV: 9.8 fL (ref 7.5–12.5)
Monocytes Relative: 9.7 %
Neutro Abs: 3077 {cells}/uL (ref 1500–7800)
Neutrophils Relative %: 64.1 %
Platelets: 243 10*3/uL (ref 140–400)
RBC: 4.02 10*6/uL (ref 3.80–5.10)
RDW: 13 % (ref 11.0–15.0)
Total Lymphocyte: 22.7 %
WBC: 4.8 10*3/uL (ref 3.8–10.8)

## 2023-07-31 LAB — COMPLETE METABOLIC PANEL WITH GFR
AG Ratio: 2.2 (calc) (ref 1.0–2.5)
ALT: 19 U/L (ref 6–29)
AST: 13 U/L (ref 10–30)
Albumin: 4.1 g/dL (ref 3.6–5.1)
Alkaline phosphatase (APISO): 33 U/L (ref 31–125)
BUN: 12 mg/dL (ref 7–25)
CO2: 26 mmol/L (ref 20–32)
Calcium: 9.3 mg/dL (ref 8.6–10.2)
Chloride: 107 mmol/L (ref 98–110)
Creat: 0.78 mg/dL (ref 0.50–0.97)
Globulin: 1.9 g/dL (ref 1.9–3.7)
Glucose, Bld: 90 mg/dL (ref 65–99)
Potassium: 4.1 mmol/L (ref 3.5–5.3)
Sodium: 140 mmol/L (ref 135–146)
Total Bilirubin: 0.4 mg/dL (ref 0.2–1.2)
Total Protein: 6 g/dL — ABNORMAL LOW (ref 6.1–8.1)
eGFR: 99 mL/min/{1.73_m2} (ref >=60)

## 2023-07-31 LAB — LIPID PANEL
Cholesterol: 205 mg/dL — ABNORMAL HIGH (ref <200)
HDL: 42 mg/dL — ABNORMAL LOW (ref >=50)
LDL Cholesterol (Calc): 134 mg/dL — ABNORMAL HIGH
Non-HDL Cholesterol (Calc): 163 mg/dL — ABNORMAL HIGH (ref <130)
Total CHOL/HDL Ratio: 4.9 (calc) (ref <5.0)
Triglycerides: 158 mg/dL — ABNORMAL HIGH (ref <150)

## 2023-08-05 DIAGNOSIS — D225 Melanocytic nevi of trunk: Secondary | ICD-10-CM | POA: Diagnosis not present

## 2023-08-05 DIAGNOSIS — D2272 Melanocytic nevi of left lower limb, including hip: Secondary | ICD-10-CM | POA: Diagnosis not present

## 2023-08-05 DIAGNOSIS — D2262 Melanocytic nevi of left upper limb, including shoulder: Secondary | ICD-10-CM | POA: Diagnosis not present

## 2023-08-05 DIAGNOSIS — D2261 Melanocytic nevi of right upper limb, including shoulder: Secondary | ICD-10-CM | POA: Diagnosis not present

## 2023-08-12 ENCOUNTER — Encounter: Payer: Self-pay | Admitting: Internal Medicine

## 2023-10-30 ENCOUNTER — Encounter: Payer: Self-pay | Admitting: Internal Medicine

## 2023-11-29 ENCOUNTER — Other Ambulatory Visit: Payer: Self-pay | Admitting: Internal Medicine

## 2023-11-29 DIAGNOSIS — G43009 Migraine without aura, not intractable, without status migrainosus: Secondary | ICD-10-CM

## 2023-11-29 DIAGNOSIS — F339 Major depressive disorder, recurrent, unspecified: Secondary | ICD-10-CM
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
## 2023-12-02 MED ORDER — SUMATRIPTAN SUCCINATE 25 MG PO TABS: 25.0000 mg | ORAL_TABLET | ORAL | 1 refills | Status: DC | PRN

## 2023-12-02 NOTE — Telephone Encounter (Signed)
 Requested Prescriptions  Pending Prescriptions Disp Refills   buPROPion  (WELLBUTRIN  XL) 300 MG 24 hr tablet 90 tablet     Sig: Take 1 tablet (300 mg total) by mouth daily.     Psychiatry: Antidepressants - bupropion  Passed - 12/02/2023 11:27 AM      Passed - Cr in normal range and within 360 days    Creat  Date Value Ref Range Status  07/30/2023 0.78 0.50 - 0.97 mg/dL Final         Passed - AST in normal range and within 360 days    AST  Date Value Ref Range Status  07/30/2023 13 10 - 30 U/L Final         Passed - ALT in normal range and within 360 days    ALT  Date Value Ref Range Status  07/30/2023 19 6 - 29 U/L Final         Passed - Completed PHQ-2 or PHQ-9 in the last 360 days      Passed - Last BP in normal range    BP Readings from Last 1 Encounters:  07/30/23 124/72         Passed - Valid encounter within last 6 months    Recent Outpatient Visits           4 months ago Depression, recurrent [facility])   [place] [facility] Bernardo Fend, DO       Future Appointments             In 8 months Bernardo Fend, DO [place] [facility]             SUMAtriptan  (IMITREX ) 25 MG tablet 30 tablet 1    Sig: Take 1 tablet (25 mg total) by mouth every 2 (two) hours as needed for migraine. May repeat in 2 hours if headache persists or recurs.     Neurology:  Migraine Therapy - Triptan Passed - 12/02/2023 11:27 AM      Passed - Last BP in normal range    BP Readings from Last 1 Encounters:  07/30/23 124/72         Passed - Valid encounter within last 12 months    Recent Outpatient Visits           4 months ago Depression, recurrent [facility])   [facility] Bernardo Fend, DO       Future Appointments             In 8 months Bernardo Fend, DO [facility]

## 2023-12-25 ENCOUNTER — Ambulatory Visit
Admission: RE | Admit: 2023-12-25 | Discharge: 2023-12-25 | Disposition: A | Discharge status: Home / Self Care | Source: Ambulatory Visit | Attending: Internal Medicine | Admitting: Internal Medicine

## 2023-12-25 DIAGNOSIS — Z1231 Encounter for screening mammogram for malignant neoplasm of breast: Secondary | ICD-10-CM | POA: Diagnosis not present

## 2023-12-30 ENCOUNTER — Other Ambulatory Visit: Payer: Self-pay | Admitting: Internal Medicine

## 2023-12-30 DIAGNOSIS — R928 Other abnormal and inconclusive findings on diagnostic imaging of breast: Secondary | ICD-10-CM

## 2023-12-30 DIAGNOSIS — R921 Mammographic calcification found on diagnostic imaging of breast: Secondary | ICD-10-CM

## 2024-01-01 ENCOUNTER — Encounter: Payer: Self-pay | Admitting: Internal Medicine

## 2024-01-02 ENCOUNTER — Ambulatory Visit
Admission: RE | Admit: 2024-01-02 | Discharge: 2024-01-02 | Disposition: A | Discharge status: Home / Self Care | Source: Ambulatory Visit | Attending: Internal Medicine | Admitting: Internal Medicine

## 2024-01-02 DIAGNOSIS — R92322 Mammographic fibroglandular density, left breast: Secondary | ICD-10-CM | POA: Diagnosis not present

## 2024-01-02 DIAGNOSIS — R921 Mammographic calcification found on diagnostic imaging of breast: Secondary | ICD-10-CM | POA: Diagnosis not present

## 2024-01-02 DIAGNOSIS — R928 Other abnormal and inconclusive findings on diagnostic imaging of breast: Secondary | ICD-10-CM | POA: Insufficient documentation

## 2024-01-03 ENCOUNTER — Ambulatory Visit: Payer: Self-pay | Admitting: Internal Medicine

## 2024-02-27 ENCOUNTER — Ambulatory Visit: Payer: Self-pay

## 2024-02-27 NOTE — Telephone Encounter (Signed)
 FYI Only or Action Required?: FYI only for provider.  Patient was last seen in primary care on 07/30/2023 by Bernardo Fend, DO.  Called Nurse Triage reporting Rash.  Symptoms began several days ago.  Interventions attempted: Nothing.  Symptoms are: gradually worsening.  Triage Disposition: See HCP Within 4 Hours (Or PCP Triage)  Patient/caregiver understands and will follow disposition?: Yes Reason for Disposition  [1] Rash is painful to touch AND [2] fever    No fever  Answer Assessment - Initial Assessment Questions Noticed it does it when on feet for extensive periods of time, works 16 hours and notices it after working. Advised UC  1. APPEARANCE of RASH: What does the rash look like? (e.g., blisters, dry flaky skin, red spots, redness, sores)     Red, shiny skin, swelling, warm to the touch  2. LOCATION: Where is the rash located?      Wraps around lower part of legs   5. ONSET: When did the rash start?      Few months ago  6. ITCHING: Does the rash itch? If Yes, ask: How bad is the itch?  (Scale 0-10; or none, mild, moderate, severe)     Itches when it first shows up, goes from itching to burning   7. PAIN: Does the rash hurt? If Yes, ask: How bad is the pain?  (Scale 0-10; or none, mild, moderate, severe)     Painful to the touch, burning, took a warm shower and it burned  8. OTHER SYMPTOMS: Do you have any other symptoms? (e.g., fever)     No, Denies other symptoms  Protocols used: Rash or Redness - Localized-A-AH  Copied from CRM #[id number]. Topic: Clinical - Red Word Triage >> Feb 27, 2024  2:15 PM Darshell M wrote: Red Word that prompted transfer to Nurse Triage: Patient calling to report a random rash for last several months of an on. Seems to happen when she is on her feet during her long schedule. Today,  the rash spread higher than it usually does and it is swollen and  burning.   ----------------------------------------------------------------------- From previous Reason for Contact - Scheduling: Patient/patient representative is calling to schedule an appointment. Refer to attachments for appointment information.

## 2024-02-28 DIAGNOSIS — I872 Venous insufficiency (chronic) (peripheral): Secondary | ICD-10-CM | POA: Diagnosis not present

## 2024-03-02 ENCOUNTER — Encounter: Payer: Self-pay | Admitting: Internal Medicine

## 2024-03-02 ENCOUNTER — Ambulatory Visit: Admitting: Internal Medicine

## 2024-03-02 ENCOUNTER — Other Ambulatory Visit: Payer: Self-pay

## 2024-03-02 VITALS — BP 120/80 | HR 100 | Temp 98.2°F | Resp 16 | Ht 65.0 in | Wt 221.9 lb

## 2024-03-02 DIAGNOSIS — G43709 Chronic migraine without aura, not intractable, without status migrainosus: Secondary | ICD-10-CM

## 2024-03-02 DIAGNOSIS — R600 Localized edema: Secondary | ICD-10-CM | POA: Diagnosis not present

## 2024-03-02 MED ORDER — SUMATRIPTAN SUCCINATE 100 MG PO TABS
100.0000 mg | ORAL_TABLET | Freq: Every day | ORAL | 0 refills | Status: AC | PRN
Start: 2024-03-02 — End: ?

## 2024-03-02 NOTE — Progress Notes (Signed)
 Acute Office Visit  Subjective:     Patient ID: Laura Wiggins, female    DOB: 12-12-1985, 38 y.o.   MRN: [id number]  Chief Complaint  Patient presents with   Rash    Seen at [facility] bilateral legs. Was referred to vacsular.   Migraine    HPI Patient is in today for follow up on rash from urgent care.   Discussed the use of AI scribe software for clinical note transcription with the patient, who gave verbal consent to proceed.  History of Present Illness Laura Wiggins is a 38 year old female who presents with bilateral leg swelling and rash.  She experiences significant swelling and a rash on the lower parts of both legs, with itching and burning sensations. These symptoms have persisted for several months, with the most severe episode last week. Symptoms worsen with prolonged standing, despite wearing compression socks. She was prescribed a diuretic at urgent care last Friday, with seven pills provided. She has not contacted the vascular specialist due to confusion about the referral process. There is some discoloration on her legs, and the swelling is non-pitting.  She also experiences migraine headaches lasting two to three days, predominantly on the right side of her head. She is on a low dose of Imitrex , which she finds ineffective.    Review of Systems  Respiratory:  Negative for shortness of breath.   Cardiovascular:  Negative for leg swelling.  Skin:  Negative for itching and rash.  Neurological:  Positive for headaches.        Objective:    BP 120/80 (Cuff Size: Large)   Pulse 100   Temp 98.2 F (36.8 C) (Oral)   Resp 16   Ht 5' 5 (1.651 m)   Wt 221 lb 14.4 oz (100.7 kg)   SpO2 97%   BMI 36.93 kg/m    Physical Exam Constitutional:      Appearance: Normal appearance.  HENT:     Head: Normocephalic and atraumatic.  Eyes:     Conjunctiva/sclera: Conjunctivae normal.  Cardiovascular:     Rate and Rhythm: Normal rate and regular rhythm.  Pulmonary:      Effort: Pulmonary effort is normal.     Breath sounds: Normal breath sounds.  Musculoskeletal:     Right lower leg: No edema.     Left lower leg: No edema.  Skin:    General: Skin is warm and dry.     Findings: No rash.  Neurological:     General: No focal deficit present.     Mental Status: She is alert. Mental status is at baseline.  Psychiatric:        Mood and Affect: Mood normal.        Behavior: Behavior normal.     No results found for any visits on 03/02/24.      Assessment & Plan:   Assessment & Plan Chronic bilateral lower extremity edema with associated rash due to venous insufficiency Chronic edema and rash likely due to venous insufficiency. Discussed risks of fluid retention and skin complications. - Submit referral for vascular surgery. - Advise wearing compression stockings during work. - Recommend elevating legs to heart level after work. - Counsel on fluid intake and dietary salt reduction. - Suggest using a heavy-duty moisturizer like Gold Bond for diabetics or Aveeno twice daily. - Instruct to cut diuretics in half and use only as needed for severe swelling.  Migraine without aura, predominantly right-sided Right-sided migraine without aura, current  Imitrex  dose ineffective. Discussed increasing dose, switching triptans, or newer medications. Emphasized breaking migraine cycle and insurance requirements. - Increase Imitrex  dose to 100 mg, take at onset and repeat after 2 hours if needed, max 200 mg in 24 hours. - Consider switching to Maxalt if ineffective. - Consider newer medications like Nurtec if two triptans fail. - Maintain follow-up for migraine management.  - Ambulatory referral to Vascular Surgery - SUMAtriptan  (IMITREX ) 100 MG tablet; Take 1 tablet (100 mg total) by mouth daily as needed for migraine. May repeat in 2 hours if headache persists or recurs. Do not exceed 200 mg in a 24 hour period.  Dispense: 30 tablet; Refill: 0   Return if  symptoms worsen or fail to improve.  Sharyle Fischer, DO

## 2024-03-02 NOTE — Patient Instructions (Signed)
 Peripheral Edema  Peripheral edema is swelling that is caused by a buildup of fluid. Peripheral edema most often affects the lower legs, ankles, and feet. It can also develop in the arms, hands, and face. The area of the body that has peripheral edema will look swollen. It may also feel heavy or warm. Your clothes may start to feel tight. Pressing on the area may make a temporary dent in your skin (pitting edema). You may not be able to move your swollen arm or leg as much as usual. There are many causes of peripheral edema. It can happen because of a complication of other conditions such as heart failure, kidney disease, or a problem with your circulation. It also can be a side effect of certain medicines or happen because of an infection. It often happens to women during pregnancy. Sometimes, the cause is not known. Follow these instructions at home: Managing pain, stiffness, and swelling  Raise (elevate) your legs while you are sitting or lying down. Move around often to prevent stiffness and to reduce swelling. Do not sit or stand for long periods of time. Do not wear tight clothing. Do not wear garters on your upper legs. Exercise your legs to get your circulation going. This helps to move the fluid back into your blood vessels, and it may help the swelling go down. Wear compression stockings as told by your health care provider. These stockings help to prevent blood clots and reduce swelling in your legs. It is important that these are the correct size. These stockings should be prescribed by your doctor to prevent possible injuries. If elastic bandages or wraps are recommended, use them as told by your health care provider. Medicines Take over-the-counter and prescription medicines only as told by your health care provider. Your health care provider may prescribe medicine to help your body get rid of excess water (diuretic). Take this medicine if you are told to take it. General  instructions Eat a low-salt (low-sodium) diet as told by your health care provider. Sometimes, eating less salt may reduce swelling. Pay attention to any changes in your symptoms. Moisturize your skin daily to help prevent skin from cracking and draining. Keep all follow-up visits. This is important. Contact a health care provider if: You have a fever. You have swelling in only one leg. You have increased swelling, redness, or pain in one or both of your legs. You have drainage or sores at the area where you have edema. Get help right away if: You have edema that starts suddenly or is getting worse, especially if you are pregnant or have a medical condition. You develop shortness of breath, especially when you are lying down. You have pain in your chest or abdomen. You feel weak. You feel like you will faint. These symptoms may be an emergency. Get help right away. Call 911. Do not wait to see if the symptoms will go away. Do not drive yourself to the hospital. Summary Peripheral edema is swelling that is caused by a buildup of fluid. Peripheral edema most often affects the lower legs, ankles, and feet. Move around often to prevent stiffness and to reduce swelling. Do not sit or stand for long periods of time. Pay attention to any changes in your symptoms. Contact a health care provider if you have edema that starts suddenly or is getting worse, especially if you are pregnant or have a medical condition. Get help right away if you develop shortness of breath, especially when lying down.  This information is not intended to replace advice given to you by your health care provider. Make sure you discuss any questions you have with your health care provider. Document Revised: 01/23/2021 Document Reviewed: 01/23/2021 Elsevier Patient Education  2024 [employer].

## 2024-03-10 DIAGNOSIS — D2261 Melanocytic nevi of right upper limb, including shoulder: Secondary | ICD-10-CM | POA: Diagnosis not present

## 2024-03-10 DIAGNOSIS — D2262 Melanocytic nevi of left upper limb, including shoulder: Secondary | ICD-10-CM | POA: Diagnosis not present

## 2024-03-10 DIAGNOSIS — D225 Melanocytic nevi of trunk: Secondary | ICD-10-CM | POA: Diagnosis not present

## 2024-03-10 DIAGNOSIS — D2272 Melanocytic nevi of left lower limb, including hip: Secondary | ICD-10-CM | POA: Diagnosis not present

## 2024-03-13 ENCOUNTER — Encounter (INDEPENDENT_AMBULATORY_CARE_PROVIDER_SITE_OTHER): Payer: Self-pay | Admitting: Vascular Surgery

## 2024-03-13 ENCOUNTER — Ambulatory Visit (INDEPENDENT_AMBULATORY_CARE_PROVIDER_SITE_OTHER): Payer: Self-pay | Admitting: Vascular Surgery

## 2024-03-13 VITALS — BP 123/79 | HR 81 | Resp 18 | Ht 65.0 in | Wt 220.2 lb

## 2024-03-13 DIAGNOSIS — M7989 Other specified soft tissue disorders: Secondary | ICD-10-CM | POA: Diagnosis not present

## 2024-03-13 DIAGNOSIS — E66812 Obesity, class 2: Secondary | ICD-10-CM

## 2024-03-13 NOTE — Progress Notes (Signed)
 Patient ID: Damien LOISE Matters, female   DOB: [birth year], 38 y.o.   MRN: [id number]  Chief Complaint  Patient presents with   Establish Care    New patient consult LE Peripheral Edema Ref. Bernardo    HPI BAYLEY YARBOROUGH is a 38 y.o. female.  I am asked to see the patient by Dr. Bernardo for evaluation of leg swelling.  The patient's been having swelling issues for several months now.  About 2 months ago, it seemed to abruptly worsen out of the blue.  Her legs became very swollen and she developed a rash and dark discoloration of both lower extremities.  No open wounds.  No fevers or chills.  No clear inciting event or causative factor.  She has been wearing compression socks with minimal improvement.  She does try to elevate her legs which does help.  No known history of DVT or superficial thrombophlebitis for her or anybody in her family.  The legs are affected roughly the same.     Past Medical History:  Diagnosis Date   Bacterial vaginosis 10/25/2020   Cancer (HCC)    Melanoma   Chest pain 10/22/2016   Chicken pox    Class 2 severe obesity due to excess calories with serious comorbidity and body mass index (BMI) of 37.0 to 37.9 in adult 07/06/2022   COVID-19 07/29/2019   diagnosed 07/29/2019 according to the patient   Cystitis 10/25/2020   Depression    Diffuse cystic mastopathy 10/25/2020   Dizziness and giddiness 10/25/2020   Probale related to numerous medications, will have patient increae H2O intake to ensure that she is adequately hydrated.   Endometriosis 2004   Endometriosis    Female pelvic pain 10/25/2020   Female stress incontinence 10/25/2020   Frequent headaches    GBS bacteriuria 04/05/2017   [ ]  treated with amoxicillin  in early pregnancy  [ ]  tx for GBS in labor   History of preterm delivery, currently pregnant 04/05/2017   [x]  17 [facility]. 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   Postcoital bleeding 10/25/2020   Postpartum care following cesarean delivery 11/16/2017   PROM (premature rupture of membranes) 11/13/2017   Supervision of high risk pregnancy, antepartum 04/05/2017   Clinic  [facility]  Prenatal Labs  Dating  L=6  Blood type: A/Positive/-- (11/02 1034)   Genetic Screen  NIPS: diploid nml XX  Antibody:Negative (11/02 1034)  Anatomic US   Incomplete @ 18wks,  [ ] f/u ord'd 1/25  Rubella: <0.90 (11/02 1034)   Varicella: Immune  GTT  Early:147, 3h all nml        Third trimester: [] needs 3hr  RPR: Non Reactive (11/02 1034)   Rhogam  n/a  HBsAg: Negative (11/02 1034)      Urge incontinence of urine 10/25/2020   Urgency of urination 10/25/2020   Urinary incontinence     Past Surgical History:  Procedure Laterality Date   CESAREAN SECTION N/A 11/14/2017   Procedure: CESAREAN SECTION;  Surgeon: Victor Claudell SAUNDERS, MD;  Location: [facility] ORS;  Service: Obstetrics;  Laterality: N/A;   TONSILLECTOMY AND ADENOIDECTOMY  2013     Family History  Problem Relation Age of  Onset   Hyperlipidemia Mother    Hypertension Mother    Diabetes Mother       Social History   Tobacco Use   Smoking status: Former    Passive exposure: Never   Smokeless tobacco: Never  Vaping Use   Vaping status: Never Used  Substance Use Topics   Alcohol use: Not Currently    Alcohol/week: 0.0 standard drinks of alcohol    Comment: occasioanlly   Drug use: No     No Known Allergies  Current Outpatient Medications  Medication Sig Dispense Refill   buPROPion  (WELLBUTRIN  XL) 300 MG 24 hr tablet Take 1 tablet (300 mg total) by mouth daily. 90 tablet 3   furosemide (LASIX) 20 MG tablet Take 20 mg by mouth daily as needed.     Multiple Vitamin (MULTIVITAMIN) capsule Take 1 capsule by  mouth daily.     OTEZLA 30 MG TABS Take 1 tablet by mouth 2 (two) times daily.     SUMAtriptan  (IMITREX ) 100 MG tablet Take 1 tablet (100 mg total) by mouth daily as needed for migraine. May repeat in 2 hours if headache persists or recurs. Do not exceed 200 mg in a 24 hour period. 30 tablet 0   No current facility-administered medications for this visit.      REVIEW OF SYSTEMS (Negative unless checked)  Constitutional: [] Weight loss  [] Fever  [] Chills Cardiac: [] Chest pain   [] Chest pressure   [] Palpitations   [] Shortness of breath when laying flat   [] Shortness of breath at rest   [] Shortness of breath with exertion. Vascular:  [x] Pain in legs with walking   [] Pain in legs at rest   [] Pain in legs when laying flat   [] Claudication   [] Pain in feet when walking  [] Pain in feet at rest  [] Pain in feet when laying flat   [] History of DVT   [] Phlebitis   [x] Swelling in legs   [] Varicose veins   [] Non-healing ulcers Pulmonary:   [] Uses home oxygen   [] Productive cough   [] Hemoptysis   [] Wheeze  [] COPD   [] Asthma Neurologic:  [] Dizziness  [] Blackouts   [] Seizures   [] History of stroke   [] History of TIA  [] Aphasia   [] Temporary blindness   [] Dysphagia   [] Weakness or numbness in arms   [] Weakness or numbness in legs Musculoskeletal:  [] Arthritis   [] Joint swelling   [] Joint pain   [] Low back pain Hematologic:  [] Easy bruising  [] Easy bleeding   [] Hypercoagulable state   [] Anemic  [] Hepatitis Gastrointestinal:  [] Blood in stool   [] Vomiting blood  [] Gastroesophageal reflux/heartburn   [] Abdominal pain Genitourinary:  [] Chronic kidney disease   [] Difficult urination  [] Frequent urination  [] Burning with urination   [] Hematuria Skin:  [] Rashes   [] Ulcers   [] Wounds Psychological:  [] History of anxiety   [x]  History of major depression.    Physical Exam BP 123/79 (BP Location: Left Arm, Patient Position: Sitting, Cuff Size: Normal)   Pulse 81   Resp 18   Ht 5' 5 (1.651 m)   Wt 220 lb 3.2 oz  (99.9 kg)   BMI 36.64 kg/m  Gen:  WD/WN, NAD Head: Pace/AT, No temporalis wasting.  Ear/Nose/Throat: Hearing grossly intact, nares w/o erythema or drainage, oropharynx w/o Erythema/Exudate Eyes: Conjunctiva clear, sclera non-icteric  Neck: trachea midline.  No JVD.  Pulmonary:  Good air movement, respirations not labored, no use of accessory muscles  Cardiac: RRR, no JVD Vascular:  Vessel Right Left  Radial Palpable Palpable  DP 2+ 2+  PT 1+ 1+   Gastrointestinal:. No masses, surgical incisions, or scars. Musculoskeletal: M/S 5/5 throughout.  Extremities without ischemic changes.  No deformity or atrophy.  1+ bilateral lower extremity edema. Neurologic: Sensation grossly intact in extremities.  Symmetrical.  Speech is fluent. Motor exam as listed above. Psychiatric: Judgment intact, Mood & affect appropriate for pt's clinical situation. Dermatologic: No rashes or ulcers noted.  No cellulitis or open wounds.    Radiology No results found.  Labs No results found for this or any previous visit (from the past 2160 hours).  Assessment/Plan:  Swelling of limb Recommend:  I have had a long discussion with the patient regarding swelling and why it  causes symptoms.  Patient will begin wearing graduated compression on a daily basis a prescription was given. The patient will  wear the stockings first thing in the morning and removing them in the evening. The patient is instructed specifically not to sleep in the stockings.   In addition, behavioral modification will be initiated.  This will include frequent elevation, use of over the counter pain medications and exercise such as walking.  Consideration for a lymph pump will also be made based upon the effectiveness of conservative therapy.  This would help to improve the edema control and prevent sequela such as ulcers and infections   Patient should undergo duplex ultrasound of the venous system to ensure  that DVT or reflux is not present.  The patient will follow-up with me after the ultrasound.   Class 2 obesity Weight loss would help lower extremity swelling.      Selinda Gu 03/13/2024, 12:34 PM   This note was created with Dragon medical transcription system.  Any errors from dictation are unintentional.

## 2024-03-13 NOTE — Assessment & Plan Note (Signed)

## 2024-03-13 NOTE — Assessment & Plan Note (Signed)
 Weight loss would help lower extremity swelling.

## 2024-04-09 ENCOUNTER — Ambulatory Visit (INDEPENDENT_AMBULATORY_CARE_PROVIDER_SITE_OTHER)

## 2024-04-09 ENCOUNTER — Encounter (INDEPENDENT_AMBULATORY_CARE_PROVIDER_SITE_OTHER): Payer: Self-pay | Admitting: Nurse Practitioner

## 2024-04-09 ENCOUNTER — Ambulatory Visit (INDEPENDENT_AMBULATORY_CARE_PROVIDER_SITE_OTHER): Admitting: Nurse Practitioner

## 2024-04-09 VITALS — BP 113/60 | HR 80 | Resp 16 | Ht 65.0 in | Wt 224.6 lb

## 2024-04-09 DIAGNOSIS — R600 Localized edema: Secondary | ICD-10-CM

## 2024-04-09 DIAGNOSIS — M7989 Other specified soft tissue disorders: Secondary | ICD-10-CM

## 2024-04-09 DIAGNOSIS — R21 Rash and other nonspecific skin eruption: Secondary | ICD-10-CM

## 2024-04-09 MED ORDER — TRIAMCINOLONE ACETONIDE 0.1 % EX OINT: TOPICAL_OINTMENT | Freq: Two times a day (BID) | CUTANEOUS | 2 refills | Status: AC

## 2024-04-12 ENCOUNTER — Encounter (INDEPENDENT_AMBULATORY_CARE_PROVIDER_SITE_OTHER): Payer: Self-pay | Admitting: Nurse Practitioner

## 2024-04-12 NOTE — Progress Notes (Incomplete)
 Subjective:    Patient ID: Laura Wiggins, female    DOB: [birth year], 38 y.o.   MRN: [id number] Chief Complaint  Patient presents with  . Follow-up    pt conv - BLE reflux    The patient returns today for follow-up evaluation of her lower extremity edema.  She notes that her legs have been painful but is largely driven by the rash that she gets with the swelling.  She notes that the last time it happened the rash covering the entirety of the bottom portion of the legs.  It was painful as well as itchy.  She notes that she is beginning to have the same areas again.  Today her noninvasive study showed no evidence of DVT or superficial phlebitis.  No evidence of deep venous insufficiency or superficial venous reflux noted bilaterally.    Review of Systems     Objective:   Physical Exam      BP 113/60   Pulse 80   Resp 16   Ht 5' 5 (1.651 m)   Wt 224 lb 9.6 oz (101.9 kg)   LMP 04/09/2024   BMI 37.38 kg/m   Past Medical History:  Diagnosis Date  . Bacterial vaginosis 10/25/2020  . Cancer (HCC)    Melanoma  . Chest pain 10/22/2016  . Chicken pox   . Class 2 severe obesity due to excess calories with serious comorbidity and body mass index (BMI) of 37.0 to 37.9 in adult 07/06/2022  . COVID-19 07/29/2019   diagnosed 07/29/2019 according to the patient  . Cystitis 10/25/2020  . Depression   . Diffuse cystic mastopathy 10/25/2020  . Dizziness and giddiness 10/25/2020   Probale related to numerous medications, will have patient increae H2O intake to ensure that she is adequately hydrated.  . Endometriosis 2004  . Endometriosis   . Female pelvic pain 10/25/2020  . Female stress incontinence 10/25/2020  . Frequent headaches   . GBS bacteriuria 04/05/2017   [ ]  treated with amoxicillin  in early pregnancy  [ ]  tx for GBS in labor  . History of preterm delivery, currently pregnant 04/05/2017   [x]  17 [facility]. - ordered 12/28  . Hx of migraines   . Interstitial cystitis 2004   . Kidney stones   . Lower extremity edema 09/11/2016  . Obesity affecting pregnancy 04/05/2017   [x]  early 1h gtt - 147, passed early 3h gtt all values  . Pain in joint, forearm 10/25/2020   suspect musculoskeletal inflammatory pain  . 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 Postcoital bleeding 10/25/2020  . Postpartum care following cesarean delivery 11/16/2017  . PROM (premature rupture of membranes) 11/13/2017  . Supervision of high risk pregnancy, antepartum 04/05/2017   Clinic  [facility]  Prenatal Labs  Dating  L=6  Blood type: A/Positive/-- (11/02 1034)   Genetic Screen  NIPS: diploid nml XX  Antibody:Negative (11/02 1034)  Anatomic US   Incomplete @ 18wks,  [ ] f/u ord'd 1/25  Rubella: <0.90 (11/02 1034)   Varicella: Immune  GTT  Early:147, 3h all nml        Third trimester: [] needs 3hr  RPR: Non Reactive (11/02 1034)   Rhogam  n/a  HBsAg: Negative (11/02 1034)     . Urge incontinence of urine 10/25/2020  . Urgency of urination 10/25/2020  . Urinary incontinence     Social History   Socioeconomic History  . Marital status: Single    Spouse name: Not on file  . Number of children: 2  . Years  of education: Not on file  . Highest education level: Bachelor's degree (e.g., BA, AB, BS)  Occupational History  . Not on file  Tobacco Use  . Smoking status: Former    Passive exposure: Never  . Smokeless tobacco: Never  Vaping Use  . Vaping status: Never Used  Substance and Sexual Activity  . Alcohol use: Not Currently    Alcohol/week: 0.0 standard drinks of alcohol    Comment: occasioanlly  . Drug use: No  . Sexual activity: Yes    Birth control/protection: None  Other Topics Concern  . Not on file  Social History Narrative  . Not on file   Social Drivers of Health   Financial Resource Strain: Low Risk  (12/13/2022)   Overall Financial Resource Strain (CARDIA)   . Difficulty of Paying Living Expenses: Not very hard  Food Insecurity: No Food Insecurity (12/13/2022)   Hunger Vital  Sign   . Worried About Programme Researcher, Broadcasting/film/video in the Last Year: Never true   . Ran Out of Food in the Last Year: Never true  Transportation Needs: No Transportation Needs (12/13/2022)   PRAPARE - Transportation   . Lack of Transportation (Medical): No   . Lack of Transportation (Non-Medical): No  Physical Activity: Sufficiently Active (12/13/2022)   Exercise Vital Sign   . Days of Exercise per Week: 3 days   . Minutes of Exercise per Session: 150+ min  Stress: Stress Concern Present (12/13/2022)   [employer] of Occupational Health - Occupational Stress Questionnaire   . Feeling of Stress : Very much  Social Connections: Unknown (12/13/2022)   Social Connection and Isolation Panel   . Frequency of Communication with Friends and Family: More than three times a week   . Frequency of Social Gatherings with Friends and Family: Once a week   . Attends Religious Services: Patient declined   . Active Member of Clubs or Organizations: No   . Attends Banker Meetings: Not on file   . Marital Status: Never married  Intimate Partner Violence: Not on file    Past Surgical History:  Procedure Laterality Date  . CESAREAN SECTION N/A 11/14/2017   Procedure: CESAREAN SECTION;  Surgeon: Victor Claudell SAUNDERS, MD;  Location: [facility] ORS;  Service: Obstetrics;  Laterality: N/A;  . TONSILLECTOMY AND ADENOIDECTOMY  2013    Family History  Problem Relation Age of Onset  . Hyperlipidemia Mother   . Hypertension Mother   . Diabetes Mother     No Known Allergies     Latest Ref Rng & Units 07/30/2023   10:44 AM 04/02/2022   10:56 AM 10/26/2020    3:40 PM  CBC  WBC 3.8 - 10.8 Thousand/uL 4.8  4.5  5.0   Hemoglobin 11.7 - 15.5 g/dL 88.1  87.8  87.6   Hematocrit 35.0 - 45.0 % 36.4  35.2  35.6   Platelets 140 - 400 Thousand/uL 243  225.0  245.0       CMP     Component Value Date/Time   NA 140 07/30/2023 1044   NA 138 07/15/2012 0925   K 4.1 07/30/2023 1044   K 3.7 07/15/2012 0925    CL 107 07/30/2023 1044   CL 107 07/15/2012 0925   CO2 26 07/30/2023 1044   CO2 22 07/15/2012 0925   GLUCOSE 90 07/30/2023 1044   GLUCOSE 104 (H) 07/15/2012 0925   BUN 12 07/30/2023 1044   BUN 11 07/15/2012 0925   CREATININE 0.78 07/30/2023 1044  CALCIUM 9.3 07/30/2023 1044   CALCIUM 8.8 07/15/2012 0925   PROT 6.0 (L) 07/30/2023 1044   ALBUMIN 4.1 04/02/2022 1056   AST 13 07/30/2023 1044   ALT 19 07/30/2023 1044   ALKPHOS 33 (L) 04/02/2022 1056   BILITOT 0.4 07/30/2023 1044   GFR 111.40 04/02/2022 1056   EGFR 99 07/30/2023 1044   GFRNONAA >60 08/28/2020 0053   GFRNONAA >60 07/15/2012 0925     No results found.     Assessment & Plan:   1. Rash and nonspecific skin eruption (Primary) Referral placed for rash evaluation and treatment - Ambulatory referral to Dermatology  2. Bilateral lower extremity edema Today noninvasive study showed no evidence of DVT, superficial phlebitis or venous insufficiency.  It is hard to say if the swelling itself is causing the rash or if the swelling is actually occurring as a result of the rash.  However she is beginning to have recurrence of the rash and that is very painful and itchy for her.  Will try to place an urgent referral to dermatology.  In the interim we have prescribed triamcinolone to see if this is helpful for her.  She should continue with conservative therapies including use of medical grade compression, elevation and activity.   Current Outpatient Medications on File Prior to Visit  Medication Sig Dispense Refill  . buPROPion  (WELLBUTRIN  XL) 300 MG 24 hr tablet Take 1 tablet (300 mg total) by mouth daily. 90 tablet 3  . furosemide (LASIX) 20 MG tablet Take 20 mg by mouth daily as needed.    . Multiple Vitamin (MULTIVITAMIN) capsule Take 1 capsule by mouth daily.    SABRA OTEZLA 30 MG TABS Take 1 tablet by mouth 2 (two) times daily.    . SUMAtriptan  (IMITREX ) 100 MG tablet Take 1 tablet (100 mg total) by mouth daily as needed for  migraine. May repeat in 2 hours if headache persists or recurs. Do not exceed 200 mg in a 24 hour period. 30 tablet 0   No current facility-administered medications on file prior to visit.    There are no Patient Instructions on file for this visit. No follow-ups on file.   Aiyah Scarpelli E Keisean Skowron, NP

## 2024-04-12 NOTE — Progress Notes (Signed)
 Subjective:    Patient ID: Laura Wiggins, female    DOB: September 04, 1985, 38 y.o.   MRN: [id number] Chief Complaint  Patient presents with   Follow-up    pt conv - BLE reflux    The patient returns today for follow-up evaluation of her lower extremity edema.  She notes that her legs have been painful but is largely driven by the rash that she gets with the swelling.  She notes that the last time it happened the rash covering the entirety of the bottom portion of the legs.  It was painful as well as itchy.  She notes that she is beginning to have the same areas again.  Today her noninvasive study showed no evidence of DVT or superficial phlebitis.  No evidence of deep venous insufficiency or superficial venous reflux noted bilaterally.  She notes that she has been wearing compression and elevating her legs but despite this the rashes recur.    Review of Systems  Cardiovascular:  Positive for leg swelling.  Skin:  Positive for rash.       itching  All other systems reviewed and are negative.      Objective:   Physical Exam Vitals reviewed.  HENT:     Head: Normocephalic.  Cardiovascular:     Rate and Rhythm: Normal rate.     Pulses: Normal pulses.  Pulmonary:     Effort: Pulmonary effort is normal.  Musculoskeletal:     Right lower leg: Edema present.     Left lower leg: Edema present.  Skin:    General: Skin is warm and dry.  Neurological:     Mental Status: She is alert and oriented to person, place, and time.  Psychiatric:        Mood and Affect: Mood normal.        Behavior: Behavior normal.        Thought Content: Thought content normal.        Judgment: Judgment normal.         BP 113/60   Pulse 80   Resp 16   Ht 5' 5 (1.651 m)   Wt 224 lb 9.6 oz (101.9 kg)   LMP 04/09/2024   BMI 37.38 kg/m   Past Medical History:  Diagnosis Date   Bacterial vaginosis 10/25/2020   Cancer (HCC)    Melanoma   Chest pain 10/22/2016   Chicken pox    Class 2 severe  obesity due to excess calories with serious comorbidity and body mass index (BMI) of 37.0 to 37.9 in adult 07/06/2022   COVID-19 07/29/2019   diagnosed 07/29/2019 according to the patient   Cystitis 10/25/2020   Depression    Diffuse cystic mastopathy 10/25/2020   Dizziness and giddiness 10/25/2020   Probale related to numerous medications, will have patient increae H2O intake to ensure that she is adequately hydrated.   Endometriosis 2004   Endometriosis    Female pelvic pain 10/25/2020   Female stress incontinence 10/25/2020   Frequent headaches    GBS bacteriuria 04/05/2017   [ ]  treated with amoxicillin  in early pregnancy  [ ]  tx for GBS in labor   History of preterm delivery, currently pregnant 04/05/2017   [x]  17 [facility]. 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   Postcoital bleeding 10/25/2020   Postpartum care following cesarean delivery 11/16/2017   PROM (premature rupture of membranes) 11/13/2017   Supervision of high risk pregnancy, antepartum 04/05/2017   Clinic  [facility]  Prenatal Labs  Dating  L=6  Blood type: A/Positive/-- (11/02 1034)   Genetic Screen  NIPS: diploid nml XX  Antibody:Negative (11/02 1034)  Anatomic US   Incomplete @ 18wks,  [ ] f/u ord'd 1/25  Rubella: <0.90 (11/02 1034)   Varicella: Immune  GTT  Early:147, 3h all nml        Third trimester: [] needs 3hr  RPR: Non Reactive (11/02 1034)   Rhogam  n/a  HBsAg: Negative (11/02 1034)      Urge incontinence of urine 10/25/2020   Urgency of urination 10/25/2020   Urinary incontinence     Social History   Socioeconomic History   Marital status: Single    Spouse name: Not on file   Number of children: 2   Years of education: Not on  file   Highest education level: Bachelor's degree (e.g., BA, AB, BS)  Occupational History   Not on file  Tobacco Use   Smoking status: Former    Passive exposure: Never   Smokeless tobacco: Never  Vaping Use   Vaping status: Never Used  Substance and Sexual Activity   Alcohol use: Not Currently    Alcohol/week: 0.0 standard drinks of alcohol    Comment: occasioanlly   Drug use: No   Sexual activity: Yes    Birth control/protection: None  Other Topics Concern   Not on file  Social History Narrative   Not on file   Social Drivers of Health   Financial Resource Strain: Low Risk  (12/13/2022)   Overall Financial Resource Strain (CARDIA)    Difficulty of Paying Living Expenses: Not very hard  Food Insecurity: No Food Insecurity (12/13/2022)   Hunger Vital Sign    Worried About Running Out of Food in the Last Year: Never true    Ran Out of Food in the Last Year: Never true  Transportation Needs: No Transportation Needs (12/13/2022)   PRAPARE - Administrator, Civil Service (Medical): No    Lack of Transportation (Non-Medical): No  Physical Activity: Sufficiently Active (12/13/2022)   Exercise Vital Sign    Days of Exercise per Week: 3 days    Minutes of Exercise per Session: 150+ min  Stress: Stress Concern Present (12/13/2022)   [employer] of Occupational Health - Occupational Stress Questionnaire    Feeling of Stress : Very much  Social Connections: Unknown (12/13/2022)   Social Connection and Isolation Panel    Frequency of Communication with Friends and Family: More than three times a week    Frequency of Social Gatherings with Friends and Family: Once a week    Attends Religious Services: Patient declined    Database Administrator or Organizations: No    Attends Engineer, Structural: Not on file    Marital Status: Never married  Intimate Partner Violence: Not on file    Past Surgical History:  Procedure Laterality Date   CESAREAN SECTION N/A  11/14/2017   Procedure: CESAREAN SECTION;  Surgeon: Victor Claudell SAUNDERS, MD;  Location: [facility] ORS;  Service: Obstetrics;  Laterality: N/A;   TONSILLECTOMY AND ADENOIDECTOMY  2013    Family History  Problem Relation Age of Onset   Hyperlipidemia Mother  Hypertension Mother    Diabetes Mother     No Known Allergies     Latest Ref Rng & Units 07/30/2023   10:44 AM 04/02/2022   10:56 AM 10/26/2020    3:40 PM  CBC  WBC 3.8 - 10.8 Thousand/uL 4.8  4.5  5.0   Hemoglobin 11.7 - 15.5 g/dL 88.1  87.8  87.6   Hematocrit 35.0 - 45.0 % 36.4  35.2  35.6   Platelets 140 - 400 Thousand/uL 243  225.0  245.0       CMP     Component Value Date/Time   NA 140 07/30/2023 1044   NA 138 07/15/2012 0925   K 4.1 07/30/2023 1044   K 3.7 07/15/2012 0925   CL 107 07/30/2023 1044   CL 107 07/15/2012 0925   CO2 26 07/30/2023 1044   CO2 22 07/15/2012 0925   GLUCOSE 90 07/30/2023 1044   GLUCOSE 104 (H) 07/15/2012 0925   BUN 12 07/30/2023 1044   BUN 11 07/15/2012 0925   CREATININE 0.78 07/30/2023 1044   CALCIUM 9.3 07/30/2023 1044   CALCIUM 8.8 07/15/2012 0925   PROT 6.0 (L) 07/30/2023 1044   ALBUMIN 4.1 04/02/2022 1056   AST 13 07/30/2023 1044   ALT 19 07/30/2023 1044   ALKPHOS 33 (L) 04/02/2022 1056   BILITOT 0.4 07/30/2023 1044   GFR 111.40 04/02/2022 1056   EGFR 99 07/30/2023 1044   GFRNONAA >60 08/28/2020 0053   GFRNONAA >60 07/15/2012 0925     No results found.     Assessment & Plan:   1. Rash and nonspecific skin eruption (Primary) Referral placed for rash evaluation and treatment - Ambulatory referral to Dermatology  2. Bilateral lower extremity edema Today noninvasive study showed no evidence of DVT, superficial phlebitis or venous insufficiency.  It is hard to say if the swelling itself is causing the rash or if the swelling is actually occurring as a result of the rash.  However she is beginning to have recurrence of the rash and that is very painful and itchy for her.   Will try to place an urgent referral to dermatology.  In the interim we have prescribed triamcinolone to see if this is helpful for her.  She should continue with conservative therapies including use of medical grade compression, elevation and activity.   Current Outpatient Medications on File Prior to Visit  Medication Sig Dispense Refill   buPROPion  (WELLBUTRIN  XL) 300 MG 24 hr tablet Take 1 tablet (300 mg total) by mouth daily. 90 tablet 3   furosemide (LASIX) 20 MG tablet Take 20 mg by mouth daily as needed.     Multiple Vitamin (MULTIVITAMIN) capsule Take 1 capsule by mouth daily.     OTEZLA 30 MG TABS Take 1 tablet by mouth 2 (two) times daily.     SUMAtriptan  (IMITREX ) 100 MG tablet Take 1 tablet (100 mg total) by mouth daily as needed for migraine. May repeat in 2 hours if headache persists or recurs. Do not exceed 200 mg in a 24 hour period. 30 tablet 0   No current facility-administered medications on file prior to visit.    There are no Patient Instructions on file for this visit. No follow-ups on file.   Brason Berthelot E Jelani Trueba, NP

## 2024-05-04 ENCOUNTER — Encounter: Payer: Self-pay | Admitting: Internal Medicine

## 2024-05-04 ENCOUNTER — Other Ambulatory Visit: Payer: Self-pay

## 2024-05-04 ENCOUNTER — Ambulatory Visit: Admitting: Internal Medicine

## 2024-05-04 VITALS — BP 124/80 | HR 100 | Temp 98.2°F | Resp 18 | Ht 65.0 in | Wt 222.5 lb

## 2024-05-04 DIAGNOSIS — R051 Acute cough: Secondary | ICD-10-CM | POA: Diagnosis not present

## 2024-05-04 DIAGNOSIS — J069 Acute upper respiratory infection, unspecified: Secondary | ICD-10-CM | POA: Diagnosis not present

## 2024-05-04 DIAGNOSIS — J029 Acute pharyngitis, unspecified: Secondary | ICD-10-CM

## 2024-05-04 LAB — POC COVID19/FLU A&B COMBO
Covid Antigen, POC: NEGATIVE
Influenza A Antigen, POC: NEGATIVE
Influenza B Antigen, POC: NEGATIVE

## 2024-05-04 LAB — POCT RAPID STREP A (OFFICE): Rapid Strep A Screen: NEGATIVE

## 2024-05-04 MED ORDER — ALBUTEROL SULFATE HFA 108 (90 BASE) MCG/ACT IN AERS: 2.0000 | INHALATION_SPRAY | Freq: Four times a day (QID) | RESPIRATORY_TRACT | 2 refills | Status: AC | PRN

## 2024-05-04 MED ORDER — BENZONATATE 100 MG PO CAPS: 100.0000 mg | ORAL_CAPSULE | Freq: Two times a day (BID) | ORAL | 0 refills | Status: AC | PRN

## 2024-05-04 NOTE — Progress Notes (Signed)
 Acute Office Visit  Subjective:     Patient ID: Laura Wiggins, female    DOB: 14-Jun-1985, 38 y.o.   MRN: [id number]  Chief Complaint  Patient presents with   Sore Throat    Cough, congestion, chills, fatigue, boy ache , feels like chest on fire for 3 days   Ear Pain    Sore Throat  Associated symptoms include congestion, coughing, ear pain and shortness of breath.   Patient is in today for cough, congestion, sore throat and body aches x 3 days.   Discussed the use of AI scribe software for clinical note transcription with the patient, who gave verbal consent to proceed.  History of Present Illness Laura Wiggins is a 38 year old female who presents with symptoms of a viral upper respiratory infection.  Symptoms began three days ago with sore throat and headache, after her son had similar symptoms. By Saturday she noted a worsening abnormal throat sensation.  Her illness has progressed to cough, congestion, sore throat, body aches, bilateral ear pain, chills, and fever. She felt very hot and then cold last night while at work.  Her cough is sometimes productive and painful and increases bladder pressure. She has no chronic respiratory illness but has shortness of breath at rest and with activity.  She used Sudafed, Mucinex, and DayQuil without relief and feels her symptoms are worsening. She has no sinus pain but has persistent bilateral ear pain.  She has a weak bladder that worsens her discomfort with coughing. She works 16-hour shifts and is scheduled to work once more this week.   Review of Systems  Constitutional:  Positive for chills and malaise/fatigue. Negative for fever.  HENT:  Positive for congestion, ear pain and sore throat. Negative for sinus pain.   Respiratory:  Positive for cough and shortness of breath. Negative for wheezing.         Objective:    BP 124/80 (Cuff Size: Large)   Pulse 100   Temp 98.2 F (36.8 C) (Oral)   Resp 18   Ht 5' 5 (1.651  m)   Wt 222 lb 8 oz (100.9 kg)   LMP 04/09/2024   SpO2 99%   BMI 37.03 kg/m  BP Readings from Last 3 Encounters:  05/04/24 124/80  04/09/24 113/60  03/13/24 123/79   Wt Readings from Last 3 Encounters:  05/04/24 222 lb 8 oz (100.9 kg)  04/09/24 224 lb 9.6 oz (101.9 kg)  03/13/24 220 lb 3.2 oz (99.9 kg)      Physical Exam Constitutional:      Appearance: Normal appearance. She is well-developed.  HENT:     Head: Normocephalic and atraumatic.     Right Ear: Tympanic membrane, ear canal and external ear normal.     Left Ear: Tympanic membrane, ear canal and external ear normal.     Nose: Congestion present.     Mouth/Throat:     Mouth: Mucous membranes are moist.     Pharynx: Posterior oropharyngeal erythema present.  Eyes:     Conjunctiva/sclera: Conjunctivae normal.  Cardiovascular:     Rate and Rhythm: Normal rate and regular rhythm.  Pulmonary:     Effort: Pulmonary effort is normal.     Breath sounds: Normal breath sounds. No wheezing, rhonchi or rales.  Skin:    General: Skin is warm and dry.  Neurological:     General: No focal deficit present.     Mental Status: She is alert. Mental status is  at baseline.  Psychiatric:        Mood and Affect: Mood normal.        Behavior: Behavior normal.     Results for orders placed or performed in visit on 05/04/24  POC Covid19/Flu A&B Antigen  Result Value Ref Range   Influenza A Antigen, POC Negative Negative   Influenza B Antigen, POC Negative Negative   Covid Antigen, POC Negative Negative  POCT rapid strep A  Result Value Ref Range   Rapid Strep A Screen Negative Negative        Assessment & Plan:   Assessment & Plan Acute viral upper respiratory infection Likely viral etiology, possibly rhinovirus. Negative for COVID, strep, and flu. No pneumonia or wheezing. Symptoms consistent with viral URI. - Prescribed albuterol inhaler every 4-6 hours as needed for wheezing or shortness of breath. - Prescribed  cough medicine. - Recommended alternating Aleve and Tylenol  for pain and fever. - Advised nasal saline spray for drainage. - Suggested benzocaine or lidocaine  spray for sore throat. - Recommended hot beverages and honey for throat comfort. - Provided work note for absence until next Monday. - Advised rest, hydration, vitamin C, and zinc. - Instructed to report if symptoms worsen or do not improve by next Monday.  - POC Covid19/Flu A&B Antigen - POCT rapid strep A - albuterol (VENTOLIN HFA) 108 (90 Base) MCG/ACT inhaler; Inhale 2 puffs into the lungs every 6 (six) hours as needed for wheezing or shortness of breath.  Dispense: 8 g; Refill: 2 - benzonatate (TESSALON) 100 MG capsule; Take 1 capsule (100 mg total) by mouth 2 (two) times daily as needed for cough.  Dispense: 20 capsule; Refill: 0   Return if symptoms worsen or fail to improve.  Sharyle Fischer, DO

## 2024-05-04 NOTE — Patient Instructions (Signed)
 Viral Respiratory Infection A respiratory infection is an illness that affects part of the respiratory system, such as the lungs, nose, or throat. A respiratory infection that is caused by a virus is called a viral respiratory infection. Common types of viral respiratory infections include: A cold. The flu (influenza). A respiratory syncytial virus (RSV) infection. What are the causes? This condition is caused by a virus. The virus may spread through contact with droplets or direct contact with infected people or their mucus or secretions. The virus may spread from person to person (is contagious). What are the signs or symptoms? Symptoms of this condition include: A stuffy or runny nose. A sore throat or cough. Shortness of breath or difficulty breathing. Yellow or green mucus (sputum). Other symptoms may include: A fever. Sweating or chills. Fatigue. Achy muscles. A headache. How is this diagnosed? This condition may be diagnosed based on: Your symptoms. A physical exam. Testing of secretions from the nose or throat. Chest X-ray. How is this treated? This condition may be treated with medicines, such as: Antiviral medicine. This may shorten the length of time a person has symptoms. Expectorants. These make it easier to cough up mucus. Decongestant nasal sprays. Acetaminophen or NSAIDs, such as ibuprofen, to relieve fever and pain. Antibiotic medicines are not prescribed for viral infections.This is because antibiotics are designed to kill bacteria. They do not kill viruses. Follow these instructions at home: Managing pain and congestion Take over-the-counter and prescription medicines only as told by your health care provider. If you have a sore throat, gargle with a mixture of salt and water 3-4 times a day or as needed. To make salt water, completely dissolve -1 tsp (3-6 g) of salt in 1 cup (237 mL) of warm water. Use nose drops made from salt water to ease congestion and  soften raw skin around your nose. Take 2 tsp (10 mL) of honey at bedtime to lessen coughing at night. Do not give honey to children who are younger than 1 year. Drink enough fluid to keep your urine pale yellow. This helps prevent dehydration and helps loosen up mucus. General instructions  Rest as much as possible. Do not drink alcohol. Do not use any products that contain nicotine or tobacco. These products include cigarettes, chewing tobacco, and vaping devices, such as e-cigarettes. If you need help quitting, ask your health care provider. Keep all follow-up visits. This is important. How is this prevented?     Get an annual flu shot. You may get the flu shot in late summer, fall, or winter. Ask your health care provider when you should get your flu shot. Avoid spreading your infection to other people. If you are sick: Wash your hands with soap and water often, especially after you cough or sneeze. Wash for at least 20 seconds. If soap and water are not available, use alcohol-based hand sanitizer. Cover your mouth when you cough. Cover your nose and mouth when you sneeze. Do not share cups or eating utensils. Clean commonly used objects often. Clean commonly touched surfaces. Stay home from work or school as told by your health care provider. Avoid contact with people who are sick during cold and flu season. This is generally fall and winter. Contact a health care provider if: Your symptoms last for 10 days or longer. Your symptoms get worse over time. You have severe sinus pain in your face or forehead. The glands in your jaw or neck become very swollen. You have shortness of breath. Get  help right away if you: Feel pain or pressure in your chest. Have trouble breathing. Faint or feel like you will faint. Have severe and persistent vomiting. Feel confused or disoriented. These symptoms may represent a serious problem that is an emergency. Do not wait to see if the symptoms will  go away. Get medical help right away. Call your local emergency services (911 in the [place] Do not drive yourself to the hospital. Summary A respiratory infection is an illness that affects part of the respiratory system, such as the lungs, nose, or throat. A respiratory infection that is caused by a virus is called a viral respiratory infection. Common types of viral respiratory infections include a cold, influenza, and respiratory syncytial virus (RSV) infection. Symptoms of this condition include a stuffy or runny nose, cough, fatigue, achy muscles, sore throat, and fevers or chills. Antibiotic medicines are not prescribed for viral infections. This is because antibiotics are designed to kill bacteria. They are not effective against viruses. This information is not intended to replace advice given to you by your health care provider. Make sure you discuss any questions you have with your health care provider. Document Revised: 08/25/2020 Document Reviewed: 08/25/2020 Elsevier Patient Education  2024 [employer].

## 2024-05-18 ENCOUNTER — Other Ambulatory Visit: Payer: Self-pay | Admitting: Internal Medicine

## 2024-05-18 DIAGNOSIS — R921 Mammographic calcification found on diagnostic imaging of breast: Secondary | ICD-10-CM

## 2024-07-14 ENCOUNTER — Ambulatory Visit (INDEPENDENT_AMBULATORY_CARE_PROVIDER_SITE_OTHER): Admitting: Nurse Practitioner

## 2024-07-28 ENCOUNTER — Encounter

## 2024-08-04 ENCOUNTER — Ambulatory Visit: Payer: BC Managed Care – PPO | Admitting: Internal Medicine

## 2024-08-11 ENCOUNTER — Ambulatory Visit: Admitting: Certified Nurse Midwife
